# Patient Record
Sex: Female | Born: 1940 | Race: White | Hispanic: No | Marital: Single | Smoking: Never smoker
Health system: Southern US, Community
[De-identification: ages and names within clinical notes are randomized; demographics above are authoritative.]

## PROBLEM LIST (undated history)

## (undated) DIAGNOSIS — R32 Unspecified urinary incontinence: Secondary | ICD-10-CM

## (undated) DIAGNOSIS — M810 Age-related osteoporosis without current pathological fracture: Secondary | ICD-10-CM

## (undated) DIAGNOSIS — I10 Essential (primary) hypertension: Secondary | ICD-10-CM

## (undated) DIAGNOSIS — E785 Hyperlipidemia, unspecified: Secondary | ICD-10-CM

---

## 1998-06-12 ENCOUNTER — Ambulatory Visit (HOSPITAL_COMMUNITY): Admission: RE | Admit: 1998-06-12 | Discharge: 1998-06-12 | Payer: Self-pay | Admitting: Internal Medicine

## 1998-10-22 ENCOUNTER — Encounter: Admission: RE | Admit: 1998-10-22 | Discharge: 1999-01-20 | Payer: Self-pay | Admitting: Internal Medicine

## 2000-06-30 ENCOUNTER — Encounter: Payer: Self-pay | Admitting: Internal Medicine

## 2000-06-30 ENCOUNTER — Ambulatory Visit (HOSPITAL_COMMUNITY): Admission: RE | Admit: 2000-06-30 | Discharge: 2000-06-30 | Payer: Self-pay | Admitting: Internal Medicine

## 2000-09-29 ENCOUNTER — Other Ambulatory Visit: Admission: RE | Admit: 2000-09-29 | Discharge: 2000-09-29 | Payer: Self-pay | Admitting: Internal Medicine

## 2000-10-15 ENCOUNTER — Encounter: Payer: Self-pay | Admitting: Internal Medicine

## 2000-10-15 ENCOUNTER — Encounter: Admission: RE | Admit: 2000-10-15 | Discharge: 2000-10-15 | Payer: Self-pay | Admitting: Internal Medicine

## 2001-10-03 ENCOUNTER — Encounter: Admission: RE | Admit: 2001-10-03 | Discharge: 2002-01-01 | Payer: Self-pay | Admitting: Internal Medicine

## 2001-11-16 ENCOUNTER — Ambulatory Visit (HOSPITAL_COMMUNITY): Admission: RE | Admit: 2001-11-16 | Discharge: 2001-11-16 | Payer: Self-pay | Admitting: Gastroenterology

## 2002-09-27 ENCOUNTER — Other Ambulatory Visit: Admission: RE | Admit: 2002-09-27 | Discharge: 2002-09-27 | Payer: Self-pay | Admitting: Internal Medicine

## 2002-11-15 ENCOUNTER — Ambulatory Visit (HOSPITAL_BASED_OUTPATIENT_CLINIC_OR_DEPARTMENT_OTHER): Admission: RE | Admit: 2002-11-15 | Discharge: 2002-11-15 | Payer: Self-pay | Admitting: Internal Medicine

## 2004-09-30 ENCOUNTER — Other Ambulatory Visit: Admission: RE | Admit: 2004-09-30 | Discharge: 2004-09-30 | Payer: Self-pay | Admitting: Internal Medicine

## 2005-10-27 ENCOUNTER — Encounter: Admission: RE | Admit: 2005-10-27 | Discharge: 2005-10-27 | Payer: Self-pay | Admitting: Internal Medicine

## 2007-11-01 ENCOUNTER — Other Ambulatory Visit: Admission: RE | Admit: 2007-11-01 | Discharge: 2007-11-01 | Payer: Self-pay | Admitting: Internal Medicine

## 2007-11-23 ENCOUNTER — Encounter: Admission: RE | Admit: 2007-11-23 | Discharge: 2007-11-23 | Payer: Self-pay | Admitting: Internal Medicine

## 2009-03-06 ENCOUNTER — Ambulatory Visit (HOSPITAL_COMMUNITY): Admission: RE | Admit: 2009-03-06 | Discharge: 2009-03-06 | Payer: Self-pay | Admitting: Internal Medicine

## 2010-06-19 ENCOUNTER — Ambulatory Visit (HOSPITAL_COMMUNITY): Admission: RE | Admit: 2010-06-19 | Discharge: 2010-06-19 | Payer: Self-pay | Admitting: Internal Medicine

## 2011-01-05 ENCOUNTER — Other Ambulatory Visit: Payer: Self-pay | Admitting: Internal Medicine

## 2011-01-05 ENCOUNTER — Other Ambulatory Visit (HOSPITAL_COMMUNITY)
Admission: RE | Admit: 2011-01-05 | Discharge: 2011-01-05 | Disposition: A | Discharge status: Home / Self Care | Payer: Medicare Other | Source: Ambulatory Visit | Attending: Internal Medicine | Admitting: Internal Medicine

## 2011-01-05 DIAGNOSIS — Z1159 Encounter for screening for other viral diseases: Secondary | ICD-10-CM | POA: Insufficient documentation

## 2011-01-05 DIAGNOSIS — Z124 Encounter for screening for malignant neoplasm of cervix: Secondary | ICD-10-CM | POA: Insufficient documentation

## 2011-01-12 ENCOUNTER — Ambulatory Visit
Admission: RE | Admit: 2011-01-12 | Discharge: 2011-01-12 | Disposition: A | Discharge status: Home / Self Care | Payer: Medicare Other | Source: Ambulatory Visit | Attending: Internal Medicine | Admitting: Internal Medicine

## 2011-03-27 NOTE — Procedures (Signed)
[facility]  Patient:    [name], [name] Visit Number: [id number] MRN: [id number]          Service Type: Attending:  Verlin Grills, M.D. Dictated by:   Verlin Grills, M.D. Proc. Date: 11/16/01   CC:         Tyson Dense, M.D.   Procedure Report  PROCEDURE PERFORMED:  Colonoscopy  INDICATION FOR PROCEDURE:  Mrs. Jaclyn Guzman is a 70 year old female, born [birth year].  She is referred for her first screening colonoscopy with polypectomy to prevent colon cancer.  I discussed with Mrs. Jaclyn Guzman the complications associated with colonoscopy and polypectomy including the 15:1000 risk of bleeding and 4:1000 risk of colonic perforation requiring surgical repair.  Mrs. Jaclyn Guzman has signed the operative permit.  ENDOSCOPIST:  Verlin Grills, M.D.  PREMEDICATION:  Versed 5 mg, Demerol 50 mg.  ENDOSCOPE:  Olympus pediatric colonoscope.  DESCRIPTION OF PROCEDURE:  After obtaining informed consent, Jaclyn Guzman was placed in the left lateral decubitus position.  I administered intravenous Demerol and intravenous Versed to achieve conscious sedation for the procedure.  The patients blood pressure, oxygen saturation and cardiac rhythm were monitored throughout the procedure and documented in the medical record. Anal inspection was normal.  Digital rectal exam was normal.  The Olympus pediatric video colonoscope was introduced into the rectum and easily advanced to the cecum.  Colonic preparation for the exam today was excellent.  Rectum - Normal.  Sigmoid colon and descending colon - Left colonic diverticulosis.  Splenic flexure - Normal.  Transverse colon - Normal.  Hepatic flexure - Normal.  Ascending colon - Normal.  Cecum and ileocecal valve - Normal.  ASSESSMENT:  Left colonic diverticulosis; otherwise normal proctocolonoscopy to the cecum.  No endoscopic evidence for the presence of colorectal neoplasia. Dictated by:    Verlin Grills, M.D. Attending:  Verlin Grills, M.D. DD:  11/16/01 TD:  11/16/01 Job: [id number]

## 2011-07-17 ENCOUNTER — Other Ambulatory Visit (HOSPITAL_COMMUNITY): Payer: Self-pay | Admitting: Internal Medicine

## 2011-07-17 DIAGNOSIS — Z1231 Encounter for screening mammogram for malignant neoplasm of breast: Secondary | ICD-10-CM

## 2011-07-29 ENCOUNTER — Ambulatory Visit (HOSPITAL_COMMUNITY)
Admission: RE | Admit: 2011-07-29 | Discharge: 2011-07-29 | Disposition: A | Discharge status: Home / Self Care | Payer: Medicare Other | Source: Ambulatory Visit | Attending: Internal Medicine | Admitting: Internal Medicine

## 2011-07-29 DIAGNOSIS — Z1231 Encounter for screening mammogram for malignant neoplasm of breast: Secondary | ICD-10-CM | POA: Insufficient documentation

## 2011-11-01 ENCOUNTER — Encounter: Payer: Self-pay | Admitting: Emergency Medicine

## 2011-11-01 ENCOUNTER — Emergency Department (HOSPITAL_BASED_OUTPATIENT_CLINIC_OR_DEPARTMENT_OTHER)
Admission: EM | Admit: 2011-11-01 | Discharge: 2011-11-01 | Disposition: A | Discharge status: Home / Self Care | Payer: Medicare Other | Attending: Emergency Medicine | Admitting: Emergency Medicine

## 2011-11-01 ENCOUNTER — Other Ambulatory Visit: Payer: Self-pay

## 2011-11-01 DIAGNOSIS — S0181XA Laceration without foreign body of other part of head, initial encounter: Secondary | ICD-10-CM

## 2011-11-01 DIAGNOSIS — Y92009 Unspecified place in unspecified non-institutional (private) residence as the place of occurrence of the external cause: Secondary | ICD-10-CM | POA: Insufficient documentation

## 2011-11-01 DIAGNOSIS — R55 Syncope and collapse: Secondary | ICD-10-CM

## 2011-11-01 DIAGNOSIS — S0120XA Unspecified open wound of nose, initial encounter: Secondary | ICD-10-CM | POA: Insufficient documentation

## 2011-11-01 DIAGNOSIS — Z79899 Other long term (current) drug therapy: Secondary | ICD-10-CM | POA: Insufficient documentation

## 2011-11-01 DIAGNOSIS — E119 Type 2 diabetes mellitus without complications: Secondary | ICD-10-CM | POA: Insufficient documentation

## 2011-11-01 DIAGNOSIS — W1811XA Fall from or off toilet without subsequent striking against object, initial encounter: Secondary | ICD-10-CM | POA: Insufficient documentation

## 2011-11-01 MED ORDER — OXYCODONE-ACETAMINOPHEN 5-325 MG PO TABS: 1.0000 | ORAL_TABLET | ORAL | Status: AC | PRN

## 2011-11-01 MED ORDER — LIDOCAINE HCL (PF) 1 % IJ SOLN
5.0000 mL | Freq: Once | INTRAMUSCULAR | Status: DC
  Filled 2011-11-01: qty 5

## 2011-11-01 NOTE — ED Notes (Signed)
Laceration to Upper side of Nose

## 2011-11-01 NOTE — ED Provider Notes (Signed)
History     CSN: [id number]  Arrival date & time 11/01/11  5621   First MD Initiated Contact with Patient 11/01/11 1115      Chief Complaint  Patient presents with  . Facial Laceration    Pt had syncopal episode while on toilet sustained lac to nose, no chest pain and no sob    (Consider location/radiation/quality/duration/timing/severity/associated sxs/prior treatment) HPI  pw facial laceration sustained at 0200 this morning.  Patients states that she was sitting on the toilet, not urinating or defacating when she exp syncopal episode without warning. +LOC, unk down time. Hit her face on a structure in front of her. Denies tongue biting, incontinence. Denies headache, dizziness, cp, palpitations, shortness of breath pre fall and currently. No neck pain or back pain. Denies hip pain. Co pain to nose and laceration to same, ratees as 3/10. Tetanus utd.  Past Medical History  Diagnosis Date  . Diabetes mellitus     History reviewed. No pertinent past surgical history.  History reviewed. No pertinent family history.  History  Substance Use Topics  . Smoking status: Never Smoker   . Smokeless tobacco: Not on file  . Alcohol Use: No    OB History    Grav Para Term Preterm Abortions TAB SAB Ect Mult Living                  Review of Systems  All other systems reviewed and are negative.   except as noted HPI  Allergies  Augmentin  Home Medications   Current Outpatient Rx  Name Route Sig Dispense Refill  . ASPIRIN 81 MG PO TABS Oral Take 81 mg by mouth daily.      . ATORVASTATIN CALCIUM 20 MG PO TABS Oral Take 20 mg by mouth daily.      Marland Kitchen METFORMIN HCL 500 MG PO TABS Oral Take 500 mg by mouth 2 (two) times daily with a meal.      . RAMIPRIL 2.5 MG PO CAPS Oral Take 2.5 mg by mouth daily.      . OXYCODONE-ACETAMINOPHEN 5-325 MG PO TABS Oral Take 1 tablet by mouth every 4 (four) hours as needed for pain. 6 tablet 0    BP 116/55  Pulse 79  Temp(Src) 97.9 F  (36.6 C) (Oral)  Resp 22  SpO2 100%  Physical Exam  Nursing note and vitals reviewed. Constitutional: She is oriented to person, place, and time. She appears well-developed.  HENT:  Head: Atraumatic.  Mouth/Throat: Oropharynx is clear and moist.       No septal hmatoma  Eyes: Conjunctivae and EOM are normal. Pupils are equal, round, and reactive to light.  Neck: Normal range of motion. Neck supple.  Cardiovascular: Normal rate, regular rhythm, normal heart sounds and intact distal pulses.   Pulmonary/Chest: Effort normal and breath sounds normal. No respiratory distress. She has no wheezes. She has no rales.  Abdominal: Soft. She exhibits no distension. There is no tenderness. There is no rebound and no guarding.  Musculoskeletal: Normal range of motion.  Neurological: She is alert and oriented to person, place, and time.  Skin: Skin is warm and dry. No rash noted.       2 cm laceration bridge of nose, no deformity  Psychiatric: She has a normal mood and affect.      Date: 11/01/2011  Rate: 79  Rhythm: normal sinus rhythm  QRS Axis: normal  Intervals: normal  ST/T Wave abnormalities: normal  Conduction Disutrbances:none  Narrative Interpretation:  Old EKG Reviewed: none available   ED Course  LACERATION REPAIR Date/Time: 11/01/2011 5:56 PM Performed by: Forbes Cellar Authorized by: Forbes Cellar Consent: Verbal consent obtained. Risks and benefits: risks, benefits and alternatives were discussed Consent given by: patient Patient understanding: patient states understanding of the procedure being performed Patient consent: the patient's understanding of the procedure matches consent given Procedure consent: procedure consent matches procedure scheduled Patient identity confirmed: arm band Body area: head/neck Location details: nose Laceration length: 2 cm Foreign bodies: no foreign bodies Tendon involvement: none Nerve involvement: none Vascular damage:  no Anesthesia: local infiltration Local anesthetic: lidocaine 1% without epinephrine Anesthetic total: 2 ml Patient sedated: no Irrigation solution: saline Amount of cleaning: standard Skin closure: 6-0 nylon Number of sutures: 4 Technique: simple Approximation: close Approximation difficulty: simple Dressing: antibiotic ointment Patient tolerance: Patient tolerated the procedure well with no immediate complications.   (including critical care time)  Labs Reviewed - No data to display No results found.   1. Facial laceration   2. Syncope and collapse     MDM   Patient has refused syncope workup including labs, CT head. EKG unremarkable. She's asymptomatic at this time. Patient aware of risks of leaving without workup for syncope. Laceration repair and home with PMD f/u. Aware of ossibility f nasal fracture. She would like to fu as outpatient       Forbes Cellar, MD 11/03/11 1800

## 2011-11-01 NOTE — ED Notes (Signed)
Care plan and wound care with suture removal

## 2011-11-01 NOTE — ED Notes (Signed)
Laceration above nose, reports LOC with painful BM while on toilet

## 2011-11-05 ENCOUNTER — Ambulatory Visit (INDEPENDENT_AMBULATORY_CARE_PROVIDER_SITE_OTHER): Payer: Medicare Other

## 2011-11-05 DIAGNOSIS — S0180XA Unspecified open wound of other part of head, initial encounter: Secondary | ICD-10-CM

## 2011-11-05 DIAGNOSIS — Z23 Encounter for immunization: Secondary | ICD-10-CM

## 2011-11-27 ENCOUNTER — Ambulatory Visit (INDEPENDENT_AMBULATORY_CARE_PROVIDER_SITE_OTHER): Payer: Medicare Other

## 2011-11-27 DIAGNOSIS — M542 Cervicalgia: Secondary | ICD-10-CM

## 2011-11-27 DIAGNOSIS — S0083XA Contusion of other part of head, initial encounter: Secondary | ICD-10-CM

## 2011-11-27 DIAGNOSIS — S0003XA Contusion of scalp, initial encounter: Secondary | ICD-10-CM

## 2012-03-16 ENCOUNTER — Other Ambulatory Visit: Payer: Self-pay | Admitting: Gastroenterology

## 2012-07-20 ENCOUNTER — Other Ambulatory Visit (HOSPITAL_COMMUNITY): Payer: Self-pay | Admitting: Internal Medicine

## 2012-07-20 DIAGNOSIS — Z1231 Encounter for screening mammogram for malignant neoplasm of breast: Secondary | ICD-10-CM

## 2012-08-09 ENCOUNTER — Ambulatory Visit (HOSPITAL_COMMUNITY)
Admission: RE | Admit: 2012-08-09 | Discharge: 2012-08-09 | Disposition: A | Discharge status: Home / Self Care | Payer: Medicare Other | Source: Ambulatory Visit | Attending: Internal Medicine | Admitting: Internal Medicine

## 2012-08-09 DIAGNOSIS — Z1231 Encounter for screening mammogram for malignant neoplasm of breast: Secondary | ICD-10-CM | POA: Insufficient documentation

## 2013-07-25 ENCOUNTER — Other Ambulatory Visit: Payer: Self-pay | Admitting: Internal Medicine

## 2013-07-25 DIAGNOSIS — G44009 Cluster headache syndrome, unspecified, not intractable: Secondary | ICD-10-CM

## 2013-07-25 DIAGNOSIS — W19XXXA Unspecified fall, initial encounter: Secondary | ICD-10-CM

## 2013-07-26 ENCOUNTER — Ambulatory Visit
Admission: RE | Admit: 2013-07-26 | Discharge: 2013-07-26 | Disposition: A | Discharge status: Home / Self Care | Payer: Medicare Other | Source: Ambulatory Visit | Attending: Internal Medicine | Admitting: Internal Medicine

## 2013-07-26 DIAGNOSIS — G44009 Cluster headache syndrome, unspecified, not intractable: Secondary | ICD-10-CM

## 2013-07-26 DIAGNOSIS — W19XXXA Unspecified fall, initial encounter: Secondary | ICD-10-CM

## 2014-01-26 ENCOUNTER — Other Ambulatory Visit (HOSPITAL_COMMUNITY): Payer: Self-pay | Admitting: Internal Medicine

## 2014-01-26 DIAGNOSIS — Z1231 Encounter for screening mammogram for malignant neoplasm of breast: Secondary | ICD-10-CM

## 2014-02-06 ENCOUNTER — Ambulatory Visit (HOSPITAL_COMMUNITY)
Admission: RE | Admit: 2014-02-06 | Discharge: 2014-02-06 | Disposition: A | Discharge status: Home / Self Care | Payer: Medicare Other | Source: Ambulatory Visit | Attending: Internal Medicine | Admitting: Internal Medicine

## 2014-02-06 DIAGNOSIS — Z1231 Encounter for screening mammogram for malignant neoplasm of breast: Secondary | ICD-10-CM

## 2015-02-25 ENCOUNTER — Other Ambulatory Visit (HOSPITAL_COMMUNITY): Payer: Self-pay | Admitting: Internal Medicine

## 2015-02-25 DIAGNOSIS — Z1231 Encounter for screening mammogram for malignant neoplasm of breast: Secondary | ICD-10-CM

## 2015-03-04 ENCOUNTER — Ambulatory Visit (HOSPITAL_COMMUNITY)
Admission: RE | Admit: 2015-03-04 | Discharge: 2015-03-04 | Disposition: A | Discharge status: Home / Self Care | Payer: Medicare Other | Source: Ambulatory Visit | Attending: Internal Medicine | Admitting: Internal Medicine

## 2015-03-04 DIAGNOSIS — Z1231 Encounter for screening mammogram for malignant neoplasm of breast: Secondary | ICD-10-CM | POA: Diagnosis present

## 2015-12-26 DIAGNOSIS — E119 Type 2 diabetes mellitus without complications: Secondary | ICD-10-CM | POA: Diagnosis not present

## 2015-12-27 DIAGNOSIS — E119 Type 2 diabetes mellitus without complications: Secondary | ICD-10-CM | POA: Diagnosis not present

## 2016-01-08 DIAGNOSIS — M25562 Pain in left knee: Secondary | ICD-10-CM | POA: Diagnosis not present

## 2016-01-08 DIAGNOSIS — M25561 Pain in right knee: Secondary | ICD-10-CM | POA: Diagnosis not present

## 2016-01-08 DIAGNOSIS — M25552 Pain in left hip: Secondary | ICD-10-CM | POA: Diagnosis not present

## 2016-01-08 DIAGNOSIS — M25551 Pain in right hip: Secondary | ICD-10-CM | POA: Diagnosis not present

## 2016-02-03 DIAGNOSIS — M8580 Other specified disorders of bone density and structure, unspecified site: Secondary | ICD-10-CM | POA: Diagnosis not present

## 2016-02-03 DIAGNOSIS — Z0000 Encounter for general adult medical examination without abnormal findings: Secondary | ICD-10-CM | POA: Diagnosis not present

## 2016-02-03 DIAGNOSIS — E782 Mixed hyperlipidemia: Secondary | ICD-10-CM | POA: Diagnosis not present

## 2016-02-03 DIAGNOSIS — G5600 Carpal tunnel syndrome, unspecified upper limb: Secondary | ICD-10-CM | POA: Diagnosis not present

## 2016-02-03 DIAGNOSIS — E1122 Type 2 diabetes mellitus with diabetic chronic kidney disease: Secondary | ICD-10-CM | POA: Diagnosis not present

## 2016-02-03 DIAGNOSIS — J309 Allergic rhinitis, unspecified: Secondary | ICD-10-CM | POA: Diagnosis not present

## 2016-02-03 DIAGNOSIS — M791 Myalgia: Secondary | ICD-10-CM | POA: Diagnosis not present

## 2016-02-03 DIAGNOSIS — L719 Rosacea, unspecified: Secondary | ICD-10-CM | POA: Diagnosis not present

## 2016-02-03 DIAGNOSIS — Z1389 Encounter for screening for other disorder: Secondary | ICD-10-CM | POA: Diagnosis not present

## 2016-02-03 DIAGNOSIS — N183 Chronic kidney disease, stage 3 (moderate): Secondary | ICD-10-CM | POA: Diagnosis not present

## 2016-03-11 DIAGNOSIS — E119 Type 2 diabetes mellitus without complications: Secondary | ICD-10-CM | POA: Diagnosis not present

## 2016-04-10 ENCOUNTER — Other Ambulatory Visit: Payer: Self-pay

## 2016-04-10 DIAGNOSIS — Z1231 Encounter for screening mammogram for malignant neoplasm of breast: Secondary | ICD-10-CM

## 2016-04-14 DIAGNOSIS — R3915 Urgency of urination: Secondary | ICD-10-CM | POA: Diagnosis not present

## 2016-04-22 ENCOUNTER — Ambulatory Visit: Payer: Medicare Other

## 2016-05-06 DIAGNOSIS — E119 Type 2 diabetes mellitus without complications: Secondary | ICD-10-CM | POA: Diagnosis not present

## 2016-05-20 ENCOUNTER — Other Ambulatory Visit: Payer: Self-pay | Admitting: Internal Medicine

## 2016-05-20 ENCOUNTER — Ambulatory Visit
Admission: RE | Admit: 2016-05-20 | Discharge: 2016-05-20 | Disposition: A | Discharge status: Home / Self Care | Payer: Medicare Other | Source: Ambulatory Visit

## 2016-05-20 DIAGNOSIS — Z1231 Encounter for screening mammogram for malignant neoplasm of breast: Secondary | ICD-10-CM | POA: Diagnosis not present

## 2016-05-22 ENCOUNTER — Other Ambulatory Visit: Payer: Self-pay | Admitting: Internal Medicine

## 2016-05-22 DIAGNOSIS — R928 Other abnormal and inconclusive findings on diagnostic imaging of breast: Secondary | ICD-10-CM

## 2016-05-28 ENCOUNTER — Ambulatory Visit
Admission: RE | Admit: 2016-05-28 | Discharge: 2016-05-28 | Disposition: A | Discharge status: Home / Self Care | Payer: Medicare Other | Source: Ambulatory Visit | Attending: Internal Medicine | Admitting: Internal Medicine

## 2016-05-28 DIAGNOSIS — N63 Unspecified lump in breast: Secondary | ICD-10-CM | POA: Diagnosis not present

## 2016-05-28 DIAGNOSIS — R928 Other abnormal and inconclusive findings on diagnostic imaging of breast: Secondary | ICD-10-CM

## 2016-06-17 DIAGNOSIS — E119 Type 2 diabetes mellitus without complications: Secondary | ICD-10-CM | POA: Diagnosis not present

## 2016-08-05 DIAGNOSIS — L719 Rosacea, unspecified: Secondary | ICD-10-CM | POA: Diagnosis not present

## 2016-08-05 DIAGNOSIS — E782 Mixed hyperlipidemia: Secondary | ICD-10-CM | POA: Diagnosis not present

## 2016-08-05 DIAGNOSIS — E1122 Type 2 diabetes mellitus with diabetic chronic kidney disease: Secondary | ICD-10-CM | POA: Diagnosis not present

## 2016-08-05 DIAGNOSIS — Z23 Encounter for immunization: Secondary | ICD-10-CM | POA: Diagnosis not present

## 2016-08-05 DIAGNOSIS — N183 Chronic kidney disease, stage 3 (moderate): Secondary | ICD-10-CM | POA: Diagnosis not present

## 2016-08-05 DIAGNOSIS — J309 Allergic rhinitis, unspecified: Secondary | ICD-10-CM | POA: Diagnosis not present

## 2016-08-08 DIAGNOSIS — E119 Type 2 diabetes mellitus without complications: Secondary | ICD-10-CM | POA: Diagnosis not present

## 2016-08-24 DIAGNOSIS — M549 Dorsalgia, unspecified: Secondary | ICD-10-CM | POA: Diagnosis not present

## 2016-09-23 DIAGNOSIS — H5201 Hypermetropia, right eye: Secondary | ICD-10-CM | POA: Diagnosis not present

## 2016-09-23 DIAGNOSIS — E119 Type 2 diabetes mellitus without complications: Secondary | ICD-10-CM | POA: Diagnosis not present

## 2016-09-23 DIAGNOSIS — Z961 Presence of intraocular lens: Secondary | ICD-10-CM | POA: Diagnosis not present

## 2016-09-23 DIAGNOSIS — H2511 Age-related nuclear cataract, right eye: Secondary | ICD-10-CM | POA: Diagnosis not present

## 2016-09-23 DIAGNOSIS — Z7984 Long term (current) use of oral hypoglycemic drugs: Secondary | ICD-10-CM | POA: Diagnosis not present

## 2016-09-23 DIAGNOSIS — H26492 Other secondary cataract, left eye: Secondary | ICD-10-CM | POA: Diagnosis not present

## 2016-09-23 DIAGNOSIS — H5212 Myopia, left eye: Secondary | ICD-10-CM | POA: Diagnosis not present

## 2016-09-23 DIAGNOSIS — H52223 Regular astigmatism, bilateral: Secondary | ICD-10-CM | POA: Diagnosis not present

## 2016-09-25 DIAGNOSIS — E119 Type 2 diabetes mellitus without complications: Secondary | ICD-10-CM | POA: Diagnosis not present

## 2016-11-17 DIAGNOSIS — E119 Type 2 diabetes mellitus without complications: Secondary | ICD-10-CM | POA: Diagnosis not present

## 2016-11-30 DIAGNOSIS — E119 Type 2 diabetes mellitus without complications: Secondary | ICD-10-CM | POA: Diagnosis not present

## 2016-12-21 DIAGNOSIS — K7689 Other specified diseases of liver: Secondary | ICD-10-CM | POA: Diagnosis not present

## 2016-12-21 DIAGNOSIS — L299 Pruritus, unspecified: Secondary | ICD-10-CM | POA: Diagnosis not present

## 2016-12-21 DIAGNOSIS — E1122 Type 2 diabetes mellitus with diabetic chronic kidney disease: Secondary | ICD-10-CM | POA: Diagnosis not present

## 2016-12-22 ENCOUNTER — Other Ambulatory Visit: Payer: Self-pay | Admitting: Internal Medicine

## 2016-12-22 DIAGNOSIS — R945 Abnormal results of liver function studies: Principal | ICD-10-CM

## 2016-12-22 DIAGNOSIS — R7989 Other specified abnormal findings of blood chemistry: Secondary | ICD-10-CM

## 2016-12-24 ENCOUNTER — Other Ambulatory Visit: Payer: Medicare Other

## 2016-12-28 ENCOUNTER — Ambulatory Visit
Admission: RE | Admit: 2016-12-28 | Discharge: 2016-12-28 | Disposition: A | Discharge status: Home / Self Care | Payer: Medicare Other | Source: Ambulatory Visit | Attending: Internal Medicine | Admitting: Internal Medicine

## 2016-12-28 DIAGNOSIS — R740 Nonspecific elevation of levels of transaminase and lactic acid dehydrogenase [LDH]: Secondary | ICD-10-CM | POA: Diagnosis not present

## 2016-12-28 DIAGNOSIS — K8020 Calculus of gallbladder without cholecystitis without obstruction: Secondary | ICD-10-CM | POA: Diagnosis not present

## 2016-12-28 DIAGNOSIS — R945 Abnormal results of liver function studies: Principal | ICD-10-CM

## 2016-12-28 DIAGNOSIS — R7989 Other specified abnormal findings of blood chemistry: Secondary | ICD-10-CM

## 2016-12-30 DIAGNOSIS — K8020 Calculus of gallbladder without cholecystitis without obstruction: Secondary | ICD-10-CM | POA: Diagnosis not present

## 2017-01-06 DIAGNOSIS — R748 Abnormal levels of other serum enzymes: Secondary | ICD-10-CM | POA: Diagnosis not present

## 2017-01-10 DIAGNOSIS — M25561 Pain in right knee: Secondary | ICD-10-CM | POA: Diagnosis not present

## 2017-01-11 DIAGNOSIS — E119 Type 2 diabetes mellitus without complications: Secondary | ICD-10-CM | POA: Diagnosis not present

## 2017-02-18 DIAGNOSIS — M8580 Other specified disorders of bone density and structure, unspecified site: Secondary | ICD-10-CM | POA: Diagnosis not present

## 2017-02-18 DIAGNOSIS — Z0000 Encounter for general adult medical examination without abnormal findings: Secondary | ICD-10-CM | POA: Diagnosis not present

## 2017-02-18 DIAGNOSIS — E1122 Type 2 diabetes mellitus with diabetic chronic kidney disease: Secondary | ICD-10-CM | POA: Diagnosis not present

## 2017-02-18 DIAGNOSIS — E782 Mixed hyperlipidemia: Secondary | ICD-10-CM | POA: Diagnosis not present

## 2017-02-24 ENCOUNTER — Other Ambulatory Visit: Payer: Self-pay | Admitting: Internal Medicine

## 2017-02-24 DIAGNOSIS — R922 Inconclusive mammogram: Secondary | ICD-10-CM

## 2017-03-03 ENCOUNTER — Other Ambulatory Visit: Payer: Medicare Other

## 2017-03-10 DIAGNOSIS — Z86010 Personal history of colonic polyps: Secondary | ICD-10-CM | POA: Diagnosis not present

## 2017-03-10 DIAGNOSIS — E119 Type 2 diabetes mellitus without complications: Secondary | ICD-10-CM | POA: Diagnosis not present

## 2017-04-07 DIAGNOSIS — K5730 Diverticulosis of large intestine without perforation or abscess without bleeding: Secondary | ICD-10-CM | POA: Diagnosis not present

## 2017-04-07 DIAGNOSIS — K648 Other hemorrhoids: Secondary | ICD-10-CM | POA: Diagnosis not present

## 2017-04-07 DIAGNOSIS — K6389 Other specified diseases of intestine: Secondary | ICD-10-CM | POA: Diagnosis not present

## 2017-04-07 DIAGNOSIS — Z86010 Personal history of colonic polyps: Secondary | ICD-10-CM | POA: Diagnosis not present

## 2017-04-13 DIAGNOSIS — K6389 Other specified diseases of intestine: Secondary | ICD-10-CM | POA: Diagnosis not present

## 2017-04-14 DIAGNOSIS — E119 Type 2 diabetes mellitus without complications: Secondary | ICD-10-CM | POA: Diagnosis not present

## 2017-05-31 ENCOUNTER — Ambulatory Visit
Admission: RE | Admit: 2017-05-31 | Discharge: 2017-05-31 | Disposition: A | Discharge status: Home / Self Care | Payer: Medicare Other | Source: Ambulatory Visit | Attending: Internal Medicine | Admitting: Internal Medicine

## 2017-05-31 ENCOUNTER — Ambulatory Visit: Payer: Medicare Other

## 2017-05-31 DIAGNOSIS — R928 Other abnormal and inconclusive findings on diagnostic imaging of breast: Secondary | ICD-10-CM | POA: Diagnosis not present

## 2017-05-31 DIAGNOSIS — R922 Inconclusive mammogram: Secondary | ICD-10-CM

## 2017-06-02 DIAGNOSIS — E119 Type 2 diabetes mellitus without complications: Secondary | ICD-10-CM | POA: Diagnosis not present

## 2017-06-10 DIAGNOSIS — R3915 Urgency of urination: Secondary | ICD-10-CM | POA: Diagnosis not present

## 2017-07-01 DIAGNOSIS — H5201 Hypermetropia, right eye: Secondary | ICD-10-CM | POA: Diagnosis not present

## 2017-07-01 DIAGNOSIS — H25811 Combined forms of age-related cataract, right eye: Secondary | ICD-10-CM | POA: Diagnosis not present

## 2017-07-01 DIAGNOSIS — H5212 Myopia, left eye: Secondary | ICD-10-CM | POA: Diagnosis not present

## 2017-07-01 DIAGNOSIS — H52223 Regular astigmatism, bilateral: Secondary | ICD-10-CM | POA: Diagnosis not present

## 2017-08-18 DIAGNOSIS — E1122 Type 2 diabetes mellitus with diabetic chronic kidney disease: Secondary | ICD-10-CM | POA: Diagnosis not present

## 2017-08-18 DIAGNOSIS — N183 Chronic kidney disease, stage 3 (moderate): Secondary | ICD-10-CM | POA: Diagnosis not present

## 2017-08-18 DIAGNOSIS — J309 Allergic rhinitis, unspecified: Secondary | ICD-10-CM | POA: Diagnosis not present

## 2017-08-18 DIAGNOSIS — E782 Mixed hyperlipidemia: Secondary | ICD-10-CM | POA: Diagnosis not present

## 2017-09-09 DIAGNOSIS — E119 Type 2 diabetes mellitus without complications: Secondary | ICD-10-CM | POA: Diagnosis not present

## 2017-09-16 DIAGNOSIS — R32 Unspecified urinary incontinence: Secondary | ICD-10-CM | POA: Diagnosis not present

## 2017-09-16 DIAGNOSIS — R3915 Urgency of urination: Secondary | ICD-10-CM | POA: Diagnosis not present

## 2017-10-13 DIAGNOSIS — M8588 Other specified disorders of bone density and structure, other site: Secondary | ICD-10-CM | POA: Diagnosis not present

## 2017-10-22 DIAGNOSIS — E1122 Type 2 diabetes mellitus with diabetic chronic kidney disease: Secondary | ICD-10-CM | POA: Diagnosis not present

## 2017-10-28 DIAGNOSIS — E119 Type 2 diabetes mellitus without complications: Secondary | ICD-10-CM | POA: Diagnosis not present

## 2017-12-17 DIAGNOSIS — E1122 Type 2 diabetes mellitus with diabetic chronic kidney disease: Secondary | ICD-10-CM | POA: Diagnosis not present

## 2018-01-19 DIAGNOSIS — R3915 Urgency of urination: Secondary | ICD-10-CM | POA: Diagnosis not present

## 2018-01-27 DIAGNOSIS — H5201 Hypermetropia, right eye: Secondary | ICD-10-CM | POA: Diagnosis not present

## 2018-01-27 DIAGNOSIS — H25819 Combined forms of age-related cataract, unspecified eye: Secondary | ICD-10-CM | POA: Diagnosis not present

## 2018-01-27 DIAGNOSIS — Z961 Presence of intraocular lens: Secondary | ICD-10-CM | POA: Diagnosis not present

## 2018-01-27 DIAGNOSIS — E119 Type 2 diabetes mellitus without complications: Secondary | ICD-10-CM | POA: Diagnosis not present

## 2018-01-28 DIAGNOSIS — E1122 Type 2 diabetes mellitus with diabetic chronic kidney disease: Secondary | ICD-10-CM | POA: Diagnosis not present

## 2018-02-24 DIAGNOSIS — Z0000 Encounter for general adult medical examination without abnormal findings: Secondary | ICD-10-CM | POA: Diagnosis not present

## 2018-02-24 DIAGNOSIS — M8580 Other specified disorders of bone density and structure, unspecified site: Secondary | ICD-10-CM | POA: Diagnosis not present

## 2018-02-24 DIAGNOSIS — E1122 Type 2 diabetes mellitus with diabetic chronic kidney disease: Secondary | ICD-10-CM | POA: Diagnosis not present

## 2018-02-24 DIAGNOSIS — E782 Mixed hyperlipidemia: Secondary | ICD-10-CM | POA: Diagnosis not present

## 2018-03-23 DIAGNOSIS — E1122 Type 2 diabetes mellitus with diabetic chronic kidney disease: Secondary | ICD-10-CM | POA: Diagnosis not present

## 2018-05-06 DIAGNOSIS — E1122 Type 2 diabetes mellitus with diabetic chronic kidney disease: Secondary | ICD-10-CM | POA: Diagnosis not present

## 2018-06-29 DIAGNOSIS — E1122 Type 2 diabetes mellitus with diabetic chronic kidney disease: Secondary | ICD-10-CM | POA: Diagnosis not present

## 2018-07-20 ENCOUNTER — Other Ambulatory Visit: Payer: Self-pay | Admitting: Internal Medicine

## 2018-07-20 DIAGNOSIS — M79604 Pain in right leg: Secondary | ICD-10-CM | POA: Diagnosis not present

## 2018-07-20 DIAGNOSIS — M79605 Pain in left leg: Secondary | ICD-10-CM

## 2018-07-20 DIAGNOSIS — Z23 Encounter for immunization: Secondary | ICD-10-CM | POA: Diagnosis not present

## 2018-07-20 DIAGNOSIS — R0989 Other specified symptoms and signs involving the circulatory and respiratory systems: Secondary | ICD-10-CM

## 2018-07-22 ENCOUNTER — Ambulatory Visit
Admission: RE | Admit: 2018-07-22 | Discharge: 2018-07-22 | Disposition: A | Discharge status: Home / Self Care | Payer: Medicare Other | Source: Ambulatory Visit | Attending: Internal Medicine | Admitting: Internal Medicine

## 2018-07-22 DIAGNOSIS — M79605 Pain in left leg: Secondary | ICD-10-CM

## 2018-07-22 DIAGNOSIS — M79662 Pain in left lower leg: Secondary | ICD-10-CM | POA: Diagnosis not present

## 2018-07-22 DIAGNOSIS — R0989 Other specified symptoms and signs involving the circulatory and respiratory systems: Secondary | ICD-10-CM

## 2018-07-22 DIAGNOSIS — M79661 Pain in right lower leg: Secondary | ICD-10-CM | POA: Diagnosis not present

## 2018-07-22 DIAGNOSIS — M79604 Pain in right leg: Secondary | ICD-10-CM

## 2018-08-15 DIAGNOSIS — E1122 Type 2 diabetes mellitus with diabetic chronic kidney disease: Secondary | ICD-10-CM | POA: Diagnosis not present

## 2018-09-09 DIAGNOSIS — E782 Mixed hyperlipidemia: Secondary | ICD-10-CM | POA: Diagnosis not present

## 2018-09-09 DIAGNOSIS — J309 Allergic rhinitis, unspecified: Secondary | ICD-10-CM | POA: Diagnosis not present

## 2018-09-09 DIAGNOSIS — E1122 Type 2 diabetes mellitus with diabetic chronic kidney disease: Secondary | ICD-10-CM | POA: Diagnosis not present

## 2018-09-09 DIAGNOSIS — N183 Chronic kidney disease, stage 3 (moderate): Secondary | ICD-10-CM | POA: Diagnosis not present

## 2018-09-14 ENCOUNTER — Other Ambulatory Visit: Payer: Self-pay | Admitting: Internal Medicine

## 2018-09-14 DIAGNOSIS — Z1231 Encounter for screening mammogram for malignant neoplasm of breast: Secondary | ICD-10-CM

## 2018-09-27 DIAGNOSIS — E1122 Type 2 diabetes mellitus with diabetic chronic kidney disease: Secondary | ICD-10-CM | POA: Diagnosis not present

## 2018-10-27 ENCOUNTER — Ambulatory Visit
Admission: RE | Admit: 2018-10-27 | Discharge: 2018-10-27 | Disposition: A | Discharge status: Home / Self Care | Payer: Medicare Other | Source: Ambulatory Visit | Attending: Internal Medicine | Admitting: Internal Medicine

## 2018-10-27 DIAGNOSIS — Z1231 Encounter for screening mammogram for malignant neoplasm of breast: Secondary | ICD-10-CM | POA: Diagnosis not present

## 2018-12-23 DIAGNOSIS — E1122 Type 2 diabetes mellitus with diabetic chronic kidney disease: Secondary | ICD-10-CM | POA: Diagnosis not present

## 2018-12-31 DIAGNOSIS — E1122 Type 2 diabetes mellitus with diabetic chronic kidney disease: Secondary | ICD-10-CM | POA: Diagnosis not present

## 2019-01-25 DIAGNOSIS — R32 Unspecified urinary incontinence: Secondary | ICD-10-CM | POA: Diagnosis not present

## 2019-01-25 DIAGNOSIS — R3915 Urgency of urination: Secondary | ICD-10-CM | POA: Diagnosis not present

## 2019-01-31 DIAGNOSIS — M1712 Unilateral primary osteoarthritis, left knee: Secondary | ICD-10-CM | POA: Diagnosis not present

## 2019-01-31 DIAGNOSIS — M25552 Pain in left hip: Secondary | ICD-10-CM | POA: Diagnosis not present

## 2019-01-31 DIAGNOSIS — M1711 Unilateral primary osteoarthritis, right knee: Secondary | ICD-10-CM | POA: Diagnosis not present

## 2019-01-31 DIAGNOSIS — M25551 Pain in right hip: Secondary | ICD-10-CM | POA: Diagnosis not present

## 2019-03-08 DIAGNOSIS — H2511 Age-related nuclear cataract, right eye: Secondary | ICD-10-CM | POA: Diagnosis not present

## 2019-03-08 DIAGNOSIS — E119 Type 2 diabetes mellitus without complications: Secondary | ICD-10-CM | POA: Diagnosis not present

## 2019-03-08 DIAGNOSIS — H5201 Hypermetropia, right eye: Secondary | ICD-10-CM | POA: Diagnosis not present

## 2019-03-08 DIAGNOSIS — Z961 Presence of intraocular lens: Secondary | ICD-10-CM | POA: Diagnosis not present

## 2019-04-08 DIAGNOSIS — E1122 Type 2 diabetes mellitus with diabetic chronic kidney disease: Secondary | ICD-10-CM | POA: Diagnosis not present

## 2019-06-19 DIAGNOSIS — L719 Rosacea, unspecified: Secondary | ICD-10-CM | POA: Diagnosis not present

## 2019-06-19 DIAGNOSIS — E782 Mixed hyperlipidemia: Secondary | ICD-10-CM | POA: Diagnosis not present

## 2019-06-19 DIAGNOSIS — M8580 Other specified disorders of bone density and structure, unspecified site: Secondary | ICD-10-CM | POA: Diagnosis not present

## 2019-06-19 DIAGNOSIS — E1122 Type 2 diabetes mellitus with diabetic chronic kidney disease: Secondary | ICD-10-CM | POA: Diagnosis not present

## 2019-06-26 DIAGNOSIS — Z7984 Long term (current) use of oral hypoglycemic drugs: Secondary | ICD-10-CM | POA: Diagnosis not present

## 2019-06-26 DIAGNOSIS — N182 Chronic kidney disease, stage 2 (mild): Secondary | ICD-10-CM | POA: Diagnosis not present

## 2019-06-26 DIAGNOSIS — E1122 Type 2 diabetes mellitus with diabetic chronic kidney disease: Secondary | ICD-10-CM | POA: Diagnosis not present

## 2019-06-26 DIAGNOSIS — E782 Mixed hyperlipidemia: Secondary | ICD-10-CM | POA: Diagnosis not present

## 2019-07-11 DIAGNOSIS — E1122 Type 2 diabetes mellitus with diabetic chronic kidney disease: Secondary | ICD-10-CM | POA: Diagnosis not present

## 2019-07-15 ENCOUNTER — Emergency Department (HOSPITAL_BASED_OUTPATIENT_CLINIC_OR_DEPARTMENT_OTHER)
Admission: EM | Admit: 2019-07-15 | Discharge: 2019-07-15 | Disposition: A | Discharge status: Home / Self Care | Payer: Medicare Other | Attending: Emergency Medicine | Admitting: Emergency Medicine

## 2019-07-15 ENCOUNTER — Other Ambulatory Visit: Payer: Self-pay

## 2019-07-15 ENCOUNTER — Emergency Department (HOSPITAL_BASED_OUTPATIENT_CLINIC_OR_DEPARTMENT_OTHER): Payer: Medicare Other

## 2019-07-15 ENCOUNTER — Encounter (HOSPITAL_BASED_OUTPATIENT_CLINIC_OR_DEPARTMENT_OTHER): Payer: Self-pay | Admitting: Emergency Medicine

## 2019-07-15 DIAGNOSIS — Z79899 Other long term (current) drug therapy: Secondary | ICD-10-CM | POA: Insufficient documentation

## 2019-07-15 DIAGNOSIS — M5136 Other intervertebral disc degeneration, lumbar region: Secondary | ICD-10-CM | POA: Diagnosis not present

## 2019-07-15 DIAGNOSIS — M545 Low back pain, unspecified: Secondary | ICD-10-CM

## 2019-07-15 DIAGNOSIS — Z7982 Long term (current) use of aspirin: Secondary | ICD-10-CM | POA: Diagnosis not present

## 2019-07-15 DIAGNOSIS — E119 Type 2 diabetes mellitus without complications: Secondary | ICD-10-CM | POA: Insufficient documentation

## 2019-07-15 NOTE — Discharge Instructions (Addendum)
You may take acetaminophen 650 mg every 4-6 hours as needed for pain relief.

## 2019-07-15 NOTE — ED Triage Notes (Signed)
Pt fell Wednesday. She fell backwards. Denies LOC, did not hit her head. C/o low back pain

## 2019-07-15 NOTE — ED Provider Notes (Signed)
[facility] EMERGENCY DEPARTMENT Provider Note   CSN: [id number] Arrival date & time: 07/15/19  1101     History   Chief Complaint Chief Complaint  Patient presents with  . Fall    HPI Jaclyn Guzman is a 78 y.o. female.     HPI 3 days ago patient fell from standing.  States she fell onto her back.  Denies hitting her head or loss of consciousness.  States she was initially shaken up.  Denies focal weakness or numbness.  Patient states over the last 2 days she has been experiencing low back pain.  Is worse with movement and palpation.  No radiation of the pain.  No urinary incontinence. Past Medical History:  Diagnosis Date  . Diabetes mellitus     There are no active problems to display for this patient.   History reviewed. No pertinent surgical history.   OB History   No obstetric history on file.      Home Medications    Prior to Admission medications   Medication Sig Start Date End Date Taking? Authorizing Provider  aspirin 81 MG tablet Take 81 mg by mouth daily.      [provider]  atorvastatin (LIPITOR) 20 MG tablet Take 20 mg by mouth daily.      [provider]  metFORMIN (GLUCOPHAGE) 500 MG tablet Take 500 mg by mouth 2 (two) times daily with a meal.      [provider]  ramipril (ALTACE) 2.5 MG capsule Take 2.5 mg by mouth daily.      [provider]    Family History Family History  Problem Relation Age of Onset  . Breast cancer Neg Hx     Social History Social History   Tobacco Use  . Smoking status: Never Smoker  . Smokeless tobacco: Never Used  Substance Use Topics  . Alcohol use: No  . Drug use: No     Allergies   Amoxicillin-pot clavulanate, Meloxicam, and Toviaz [fesoterodine fumarate er]   Review of Systems Review of Systems  Constitutional: Negative for chills and fever.  Respiratory: Negative for cough and shortness of breath.   Cardiovascular: Negative for chest pain.   Gastrointestinal: Negative for abdominal pain, nausea and vomiting.  Genitourinary: Negative for dysuria, flank pain and frequency.  Musculoskeletal: Positive for back pain. Negative for arthralgias and myalgias.  Skin: Negative for rash and wound.  Neurological: Negative for dizziness, weakness, light-headedness, numbness and headaches.  All other systems reviewed and are negative.    Physical Exam Updated Vital Signs BP 129/67 (BP Location: Left Arm)   Pulse 76   Temp 98.7 F (37.1 C) (Oral)   Resp 18   Ht 5\' 3"  (1.6 m)   Wt 60.8 kg   SpO2 99%   BMI 23.74 kg/m   Physical Exam Vitals signs and nursing note reviewed.  Constitutional:      General: She is not in acute distress.    Appearance: Normal appearance. She is well-developed. She is not ill-appearing.  HENT:     Head: Normocephalic and atraumatic.     Nose: Nose normal.     Mouth/Throat:     Mouth: Mucous membranes are moist.  Eyes:     Pupils: Pupils are equal, round, and reactive to light.  Neck:     Musculoskeletal: Normal range of motion and neck supple.  Cardiovascular:     Rate and Rhythm: Normal rate and regular rhythm.  Pulmonary:  Effort: Pulmonary effort is normal.     Breath sounds: Normal breath sounds.  Abdominal:     General: Bowel sounds are normal.     Palpations: Abdomen is soft.     Tenderness: There is no abdominal tenderness. There is no guarding or rebound.  Musculoskeletal: Normal range of motion.        General: Tenderness present.     Comments: Patient has focal tenderness in the inferior midline lumbar spine.  No step-offs or deformity.  Distal pulses are 2+.  Skin:    General: Skin is warm and dry.     Findings: No erythema or rash.  Neurological:     General: No focal deficit present.     Mental Status: She is alert and oriented to person, place, and time.     Comments: 5/5 motor in all extremities.  Sensation intact specifically no saddle anesthesia.  Psychiatric:         Mood and Affect: Mood normal.        Behavior: Behavior normal.      ED Treatments / Results  Labs (all labs ordered are listed, but only abnormal results are displayed) Labs Reviewed - No data to display  EKG None  Radiology Dg Lumbar Spine Complete  Result Date: 07/15/2019 CLINICAL DATA:  Low back pain for 3 days since a fall. Initial encounter. EXAM: LUMBAR SPINE - COMPLETE 4+ VIEW COMPARISON:  None. FINDINGS: There is mild convex left scoliosis. No fracture or listhesis. Loss of disc space height is most notable at L4-5. Facet degenerative disease is worst at L4-5 and L5-S1. Paraspinous structures demonstrate atherosclerosis. IMPRESSION: No acute finding. Mild convex left scoliosis.  Lower lumbar spondylosis also seen. Atherosclerosis. Electronically Signed   By: Inge Rise M.D.   On: 07/15/2019 12:52    Procedures Procedures (including critical care time)  Medications Ordered in ED Medications - No data to display   Initial Impression / Assessment and Plan / ED Course  I have reviewed the triage vital signs and the nursing notes.  Pertinent labs & imaging results that were available during my care of the patient were reviewed by me and considered in my medical decision making (see chart for details).        X-rays without evidence of fracture.  Patient does have degenerative disc disease which may be responsible for her symptoms.  Do not believe that further imaging is necessary at this point.  Has a normal neurologic exam.  Will treat symptomatically with acetaminophen.  Return precautions given.  Final Clinical Impressions(s) / ED Diagnoses   Final diagnoses:  Acute midline low back pain without sciatica  Degenerative lumbar disc    ED Discharge Orders    None       Julianne Rice, MD 07/15/19 1411

## 2019-08-18 ENCOUNTER — Encounter (HOSPITAL_BASED_OUTPATIENT_CLINIC_OR_DEPARTMENT_OTHER): Payer: Self-pay

## 2019-08-18 ENCOUNTER — Observation Stay (HOSPITAL_BASED_OUTPATIENT_CLINIC_OR_DEPARTMENT_OTHER)
Admission: EM | Admit: 2019-08-18 | Discharge: 2019-08-20 | Disposition: A | Discharge status: Home / Self Care | Payer: Medicare Other | Attending: Internal Medicine | Admitting: Internal Medicine

## 2019-08-18 ENCOUNTER — Other Ambulatory Visit: Payer: Self-pay

## 2019-08-18 ENCOUNTER — Emergency Department (HOSPITAL_BASED_OUTPATIENT_CLINIC_OR_DEPARTMENT_OTHER): Payer: Medicare Other

## 2019-08-18 DIAGNOSIS — R29818 Other symptoms and signs involving the nervous system: Secondary | ICD-10-CM | POA: Diagnosis not present

## 2019-08-18 DIAGNOSIS — Y939 Activity, unspecified: Secondary | ICD-10-CM | POA: Diagnosis not present

## 2019-08-18 DIAGNOSIS — I10 Essential (primary) hypertension: Secondary | ICD-10-CM | POA: Diagnosis not present

## 2019-08-18 DIAGNOSIS — E119 Type 2 diabetes mellitus without complications: Secondary | ICD-10-CM | POA: Insufficient documentation

## 2019-08-18 DIAGNOSIS — Y999 Unspecified external cause status: Secondary | ICD-10-CM | POA: Insufficient documentation

## 2019-08-18 DIAGNOSIS — Z20828 Contact with and (suspected) exposure to other viral communicable diseases: Secondary | ICD-10-CM | POA: Insufficient documentation

## 2019-08-18 DIAGNOSIS — R32 Unspecified urinary incontinence: Secondary | ICD-10-CM | POA: Diagnosis not present

## 2019-08-18 DIAGNOSIS — Y929 Unspecified place or not applicable: Secondary | ICD-10-CM | POA: Insufficient documentation

## 2019-08-18 DIAGNOSIS — W1839XA Other fall on same level, initial encounter: Secondary | ICD-10-CM | POA: Diagnosis not present

## 2019-08-18 DIAGNOSIS — S92301A Fracture of unspecified metatarsal bone(s), right foot, initial encounter for closed fracture: Secondary | ICD-10-CM

## 2019-08-18 DIAGNOSIS — S92514A Nondisplaced fracture of proximal phalanx of right lesser toe(s), initial encounter for closed fracture: Secondary | ICD-10-CM | POA: Diagnosis not present

## 2019-08-18 DIAGNOSIS — S99921A Unspecified injury of right foot, initial encounter: Secondary | ICD-10-CM | POA: Diagnosis not present

## 2019-08-18 DIAGNOSIS — S92351A Displaced fracture of fifth metatarsal bone, right foot, initial encounter for closed fracture: Secondary | ICD-10-CM | POA: Diagnosis not present

## 2019-08-18 DIAGNOSIS — Z79899 Other long term (current) drug therapy: Secondary | ICD-10-CM | POA: Insufficient documentation

## 2019-08-18 DIAGNOSIS — E785 Hyperlipidemia, unspecified: Secondary | ICD-10-CM | POA: Insufficient documentation

## 2019-08-18 DIAGNOSIS — R296 Repeated falls: Secondary | ICD-10-CM | POA: Diagnosis not present

## 2019-08-18 DIAGNOSIS — E1149 Type 2 diabetes mellitus with other diabetic neurological complication: Secondary | ICD-10-CM

## 2019-08-18 DIAGNOSIS — M6281 Muscle weakness (generalized): Secondary | ICD-10-CM | POA: Diagnosis not present

## 2019-08-18 DIAGNOSIS — G459 Transient cerebral ischemic attack, unspecified: Secondary | ICD-10-CM | POA: Diagnosis not present

## 2019-08-18 DIAGNOSIS — Z8673 Personal history of transient ischemic attack (TIA), and cerebral infarction without residual deficits: Secondary | ICD-10-CM | POA: Diagnosis present

## 2019-08-18 DIAGNOSIS — M79674 Pain in right toe(s): Secondary | ICD-10-CM | POA: Diagnosis not present

## 2019-08-18 HISTORY — DX: Age-related osteoporosis without current pathological fracture: M81.0

## 2019-08-18 HISTORY — DX: Essential (primary) hypertension: I10

## 2019-08-18 HISTORY — DX: Hyperlipidemia, unspecified: E78.5

## 2019-08-18 HISTORY — DX: Unspecified urinary incontinence: R32

## 2019-08-18 LAB — COMPREHENSIVE METABOLIC PANEL
ALT: 18 U/L (ref 0–44)
AST: 18 U/L (ref 15–41)
Albumin: 4.2 g/dL (ref 3.5–5.0)
Alkaline Phosphatase: 41 U/L (ref 38–126)
Anion gap: 11 (ref 5–15)
BUN: 19 mg/dL (ref 8–23)
CO2: 24 mmol/L (ref 22–32)
Calcium: 9.6 mg/dL (ref 8.9–10.3)
Chloride: 101 mmol/L (ref 98–111)
Creatinine, Ser: 0.84 mg/dL (ref 0.44–1.00)
GFR calc Af Amer: >60 mL/min (ref >60)
GFR calc non Af Amer: >60 mL/min (ref >60)
Glucose, Bld: 103 mg/dL — ABNORMAL HIGH (ref 70–99)
Potassium: 4 mmol/L (ref 3.5–5.1)
Sodium: 136 mmol/L (ref 135–145)
Total Bilirubin: 0.7 mg/dL (ref 0.3–1.2)
Total Protein: 6.7 g/dL (ref 6.5–8.1)

## 2019-08-18 LAB — DIFFERENTIAL
Abs Immature Granulocytes: 0.01 10*3/uL (ref 0.00–0.07)
Basophils Absolute: 0 10*3/uL (ref 0.0–0.1)
Basophils Relative: 1 %
Eosinophils Absolute: 0.1 10*3/uL (ref 0.0–0.5)
Eosinophils Relative: 2 %
Immature Granulocytes: 0 %
Lymphocytes Relative: 31 %
Lymphs Abs: 2 10*3/uL (ref 0.7–4.0)
Monocytes Absolute: 0.5 10*3/uL (ref 0.1–1.0)
Monocytes Relative: 8 %
Neutro Abs: 3.8 10*3/uL (ref 1.7–7.7)
Neutrophils Relative %: 58 %

## 2019-08-18 LAB — CBC
HCT: 39.4 % (ref 36.0–46.0)
Hemoglobin: 13.1 g/dL (ref 12.0–15.0)
MCH: 31.9 pg (ref 26.0–34.0)
MCHC: 33.2 g/dL (ref 30.0–36.0)
MCV: 95.9 fL (ref 80.0–100.0)
Platelets: 226 10*3/uL (ref 150–400)
RBC: 4.11 MIL/uL (ref 3.87–5.11)
RDW: 13.2 % (ref 11.5–15.5)
WBC: 6.5 10*3/uL (ref 4.0–10.5)
nRBC: 0 % (ref 0.0–0.2)

## 2019-08-18 LAB — URINALYSIS, ROUTINE W REFLEX MICROSCOPIC
Bilirubin Urine: NEGATIVE
Glucose, UA: NEGATIVE mg/dL
Hgb urine dipstick: NEGATIVE
Ketones, ur: NEGATIVE mg/dL
Nitrite: POSITIVE — AB
Protein, ur: NEGATIVE mg/dL
Specific Gravity, Urine: 1.01 (ref 1.005–1.030)
pH: 6 (ref 5.0–8.0)

## 2019-08-18 LAB — RAPID URINE DRUG SCREEN, HOSP PERFORMED
Amphetamines: NOT DETECTED
Barbiturates: NOT DETECTED
Benzodiazepines: NOT DETECTED
Cocaine: NOT DETECTED
Opiates: NOT DETECTED
Tetrahydrocannabinol: NOT DETECTED

## 2019-08-18 LAB — URINALYSIS, MICROSCOPIC (REFLEX)

## 2019-08-18 LAB — ETHANOL: Alcohol, Ethyl (B): <10 mg/dL (ref <10)

## 2019-08-18 LAB — PROTIME-INR
INR: 1 (ref 0.8–1.2)
Prothrombin Time: 12.9 seconds (ref 11.4–15.2)

## 2019-08-18 LAB — APTT: aPTT: 25 seconds (ref 24–36)

## 2019-08-18 NOTE — ED Provider Notes (Addendum)
Emergency Department Provider Note   I have reviewed the triage vital signs and the nursing notes.   HISTORY  Chief Complaint Weakness   HPI Jaclyn Guzman is a 78 y.o. female with past medical history of diabetes and hyperlipidemia presents to the emergency department with acute onset right leg weakness with significant gait instability and fall.  Patient estimates that symptoms began around 8 or 9 PM yesterday.  She states she felt like her right leg suddenly became heavy and when she attempted to walk she fell.  She states she was able to crawl back into a chair and stumbled her way through the house leaning on furniture in the walls.  She awoke this morning with continued symptoms and ultimately called family after lunch to be transported to the emergency department.  She states she required significant assistance getting out of the vehicle.  She denies any associated numbness.  No vision changes.  No speech disturbance.  No difficulty swallowing.  No history of stroke.  She does have pain in her right little toe after the initial fall.  Past Medical History:  Diagnosis Date  . Diabetes mellitus     Patient Active Problem List   Diagnosis Date Noted  . TIA (transient ischemic attack) 08/18/2019    History reviewed. No pertinent surgical history.  Allergies Amoxicillin-pot clavulanate, Meloxicam, and Toviaz [fesoterodine fumarate er]  Family History  Problem Relation Age of Onset  . Breast cancer Neg Hx     Social History Social History   Tobacco Use  . Smoking status: Never Smoker  . Smokeless tobacco: Never Used  Substance Use Topics  . Alcohol use: No  . Drug use: No    Review of Systems  Constitutional: No fever/chills Eyes: No visual changes. ENT: No sore throat. Cardiovascular: Denies chest pain. Respiratory: Denies shortness of breath Gastrointestinal: No abdominal pain. No nausea, no vomiting. No diarrhea. No constipation. Genitourinary:  Negative for dysuria. Musculoskeletal: Negative for back pain. Positive right little toe pain.  Skin: Negative for rash. Neurological: Negative for headaches or numbness. Right leg weakness.   10-point ROS otherwise negative.  ____________________________________________   PHYSICAL EXAM:  VITAL SIGNS: ED Triage Vitals  Enc Vitals Group     BP 08/18/19 1550 (!) 135/58     Pulse Rate 08/18/19 1550 80     Resp 08/18/19 1550 18     Temp 08/18/19 1550 98.1 F (36.7 C)     Temp Source 08/18/19 1550 Oral     SpO2 08/18/19 1550 100 %     Weight 08/18/19 1549 135 lb (61.2 kg)     Height 08/18/19 1549 5\' 3"  (1.6 m)   Constitutional: Alert and oriented. Well appearing and in no acute distress. Eyes: Conjunctivae are normal. PERRL. EOMI. Head: Atraumatic. Nose: No congestion/rhinnorhea. Mouth/Throat: Mucous membranes are moist.  Neck: No stridor. Cardiovascular: Normal rate, regular rhythm. Good peripheral circulation. Grossly normal heart sounds.   Respiratory: Normal respiratory effort.  No retractions. Lungs CTAB. Gastrointestinal: Soft and nontender. No distention.  Musculoskeletal: Mild bruising at the distal fifth metatarsal on the right.  No clear deformity or severe swelling.  No tenderness to palpation over the ankle or knee.  Normal range of motion of bilateral hips. Neurologic:  Normal speech and language.  5 out of 5 strength with no pronator drift in the bilateral upper extremities. 5/5 strength with hip flexion/extension with normal LE sensation. Normal strength with plantar and dorsiflexion.  Skin:  Skin is warm, dry  and intact. No rash noted.  ____________________________________________   LABS (all labs ordered are listed, but only abnormal results are displayed)  Labs Reviewed  COMPREHENSIVE METABOLIC PANEL - Abnormal; Notable for the following components:      Result Value   Glucose, Bld 103 (*)    All other components within normal limits  URINALYSIS, ROUTINE W  REFLEX MICROSCOPIC - Abnormal; Notable for the following components:   APPearance HAZY (*)    Nitrite POSITIVE (*)    Leukocytes,Ua SMALL (*)    All other components within normal limits  URINALYSIS, MICROSCOPIC (REFLEX) - Abnormal; Notable for the following components:   Bacteria, UA MANY (*)    All other components within normal limits  SARS CORONAVIRUS 2 (TAT 6-24 HRS)  ETHANOL  PROTIME-INR  APTT  CBC  DIFFERENTIAL  RAPID URINE DRUG SCREEN, HOSP PERFORMED   ____________________________________________  RADIOLOGY  Ct Head Wo Contrast  Result Date: 08/18/2019 CLINICAL DATA:  Stroke suspected. Focal neurologic deficit of longer than 6 hours. EXAM: CT HEAD WITHOUT CONTRAST TECHNIQUE: Contiguous axial images were obtained from the base of the skull through the vertex without intravenous contrast. COMPARISON:  July 26, 2013 FINDINGS: Brain: No evidence of acute infarction, hemorrhage, hydrocephalus, extra-axial collection or mass lesion/mass effect. Moderate to advanced brain parenchymal volume loss. Advanced deep white matter microangiopathy Vascular: Calcific atherosclerotic disease. Skull: Normal. Negative for fracture or focal lesion. Sinuses/Orbits: No acute finding. Other: None. IMPRESSION: 1. No acute intracranial abnormality. 2. 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 Electronically Signed   By: Fidela Salisbury M.D.   On: 08/18/2019 17:47   Dg Foot Complete Right  Result Date: 08/18/2019 CLINICAL DATA:  Fifth toe pain after fall. EXAM: RIGHT FOOT COMPLETE - 3+ VIEW COMPARISON:  None. FINDINGS: Suspected acute nondisplaced intra-articular fracture through the medial base of the fifth proximal phalanx. No additional fracture. No dislocation. Joint spaces are preserved. Osteopenia. Soft tissue swelling around the fifth MTP joint. IMPRESSION: 1. Suspected acute nondisplaced intra-articular fracture through the medial base of the fifth  proximal phalanx. Electronically Signed   By: Titus Dubin M.D.   On: 08/18/2019 17:49    ____________________________________________   PROCEDURES  Procedure(s) performed:   Procedures  None  ____________________________________________   INITIAL IMPRESSION / ASSESSMENT AND PLAN / ED COURSE  Pertinent labs & imaging results that were available during my care of the patient were reviewed by me and considered in my medical decision making (see chart for details).   Patient presents to the emergency department for evaluation of acute onset right leg weakness with fall and gait disturbance since 8:52 PM yesterday.  Her lower extremity exam shows relatively equal strength in the bilateral lower extremities.  No sensory deficit.  Defer ambulation at this time.  No back pain.  No chest or abdomen discomfort.  Plan for initial CT imaging and lab work and will then ambulate for further information.  Labs and imaging reviewed.  Discussed the case with tele-neurology.  Patient's gait is abnormal but with the fracture in the right foot I would suspect this to some degree.  Patient gives a good history of initially having right leg weakness prior to her fall as opposed to weakness afterwards. Question TIA with no clear deficits at this time. No exam findings or history to suspect spine etiology. Tele-Neuro agrees with assessment. Post-op shoe applied for foot fx.  Discussed patient's case with [facility], Dr. Roel Cluck to request admission. Patient and family (if present) updated  with plan. Care transferred to [facility] service.  I reviewed all nursing notes, vitals, pertinent old records, EKGs, labs, imaging (as available).  EKG:    EKG Interpretation  Date/Time:  Friday August 18 2019 17:13:15 EDT Ventricular Rate:  79 PR Interval:    QRS Duration: 108 QT Interval:  389 QTC Calculation: 446 R Axis:   68 Text Interpretation:  Sinus rhythm Low voltage, precordial leads No STEMI  Confirmed by Alona Bene [phone]) on 08/18/2019 9:30:41 PM        ____________________________________________  FINAL CLINICAL IMPRESSION(S) / ED DIAGNOSES  Final diagnoses:  TIA (transient ischemic attack)  Fracture of unspecified metatarsal bone(s), right foot, initial encounter for closed fracture    Note:  This document was prepared using Dragon voice recognition software and may include unintentional dictation errors.  Alona Bene, MD, FACEP Emergency Medicine    Long, Arlyss Repress, MD 08/18/19 2117    Maia Plan, MD 08/18/19 2131

## 2019-08-18 NOTE — ED Notes (Signed)
teleneuro at bedside with pt. RN assisted with assessment.

## 2019-08-18 NOTE — Consult Note (Signed)
[facility] TeleNeurology Consult Services  Stat Consult  Date of Service:   08/18/2019 18:07:18  Impression:     .  Rule Out Acute Ischemic Stroke  Comments/Sign-Out: On exam she appears to have minimal right arm and right leg drift. I am concerned about a possible left brain stroke. I discussed this in detail with the patient. I would suggest getting an MRI of the head, MRA of the head and neck for TIA work-up. I would recommend starting her on full dosage of aspirin 325 daily. Depending on the results of these tests we will plan further care. She is not a candidate for alteplase because of duration of symptoms and has no clinical signs symptoms of large vessel occlusion  Metrics: [facility] Notification Time: 08/18/2019 18:05:27 Stamp Time: 08/18/2019 18:07:18 Callback Response Time: 08/18/2019 18:07:15 Video Start Time: 08/18/2019 18:22:42 Video End Time: 08/18/2019 18:33:58  Our recommendations are outlined below.  Recommendations:     .  Initiate Aspirin 325 MG Daily   Imaging Studies:     .  MRI Head  Disposition: Neurology Follow Up Recommended  Sign Out:     .  Discussed with Emergency Department Provider  ----------------------------------------------------------------------------------------------------  Chief Complaint: Recurrent falls  History of Present Illness: Patient is a 78 year old Female.  Extremely pleasant 78 year old female admitted to the hospital because of recurrent falls. She reports she has fallen few times. She came with injury to her right foot because of recurrent falls. She denies any issues with her speech or swallowing. Denies double vision. Denies any visual problems. Denies any numbness or tingling in her arms or legs. Denies syncopal episodes    Past Medical History:     . Hypertension     . Diabetes Mellitus     . There is NO history of Hyperlipidemia     . There is NO history of Atrial Fibrillation     .  There is NO history of Coronary Artery Disease     . There is NO history of Stroke  Anticoagulant use:  No  Antiplatelet use: No Examination: BP(120/52), Pulse(82), Blood Glucose(76) 1A: Level of Consciousness - Alert; keenly responsive + 0 1B: Ask Month and Age - Both Questions Right + 0 1C: Blink Eyes & Squeeze Hands - Performs Both Tasks + 0 2: Test Horizontal Extraocular Movements - Normal + 0 3: Test Visual Fields - No Visual Loss + 0 4: Test Facial Palsy (Use Grimace if Obtunded) - Normal symmetry + 0 5A: Test Left Arm Motor Drift - No Drift for 10 Seconds + 0 5B: Test Right Arm Motor Drift - Drift, but doesn't hit bed + 1 6A: Test Left Leg Motor Drift - No Drift for 5 Seconds + 0 6B: Test Right Leg Motor Drift - Drift, but doesn't hit bed + 1 7: Test Limb Ataxia (FNF/Heel-Shin) - No Ataxia + 0 8: Test Sensation - Normal; No sensory loss + 0 9: Test Language/Aphasia - Normal; No aphasia + 0 10: Test Dysarthria - Normal + 0 11: Test Extinction/Inattention - No abnormality + 0  NIHSS Score: 2  Patient/Family was informed the Neurology Consult would happen via TeleHealth consult by way of interactive audio and video telecommunications and consented to receiving care in this manner.  Due to the immediate potential for life-threatening deterioration due to underlying acute neurologic illness, I spent 25 minutes providing critical care. This time includes time for face to face visit via telemedicine, review of medical records, imaging studies and  discussion of findings with providers, the patient and/or family.   Dr Faustino Congress   [facility] [phone]  Case [id number]

## 2019-08-18 NOTE — Plan of Care (Signed)
78 yo F with DM HTN  Yesterday at 8-9 pm Right LE weakness tried to stand up but fell Had non displace FRX of her foot Has been ataxic today Now non focal CT head negative Tele  neurology neuro feels will need work up for TIA  COVID test penidng  Discussed with Dr. Laverta Baltimore Requested orthopedics consult regarding broken foot to have follow-up arranged  And please admit to observation telemetry  Would notify to neurology on arrival the patient Will need MRI and TIA work-up  Jaclyn Guzman 7:13 PM

## 2019-08-18 NOTE — ED Notes (Signed)
Pt assisted to BR via wheelchair- pt provided urine sample but not adequate amount. Will attempt sample again soon.

## 2019-08-18 NOTE — ED Notes (Signed)
Pt offered meal at this time- pt declined.

## 2019-08-18 NOTE — ED Notes (Signed)
Patient transported to CT 

## 2019-08-18 NOTE — ED Triage Notes (Signed)
Pt c/o weakness to right LE started last night-fell last night "several times"-pain and redness to right pinky toe-to triage in w/c-NAD

## 2019-08-19 ENCOUNTER — Encounter (HOSPITAL_COMMUNITY): Payer: Self-pay | Admitting: Internal Medicine

## 2019-08-19 ENCOUNTER — Observation Stay (HOSPITAL_COMMUNITY): Payer: Medicare Other

## 2019-08-19 ENCOUNTER — Observation Stay (HOSPITAL_BASED_OUTPATIENT_CLINIC_OR_DEPARTMENT_OTHER): Payer: Medicare Other

## 2019-08-19 DIAGNOSIS — G459 Transient cerebral ischemic attack, unspecified: Secondary | ICD-10-CM | POA: Diagnosis not present

## 2019-08-19 DIAGNOSIS — R296 Repeated falls: Secondary | ICD-10-CM | POA: Diagnosis not present

## 2019-08-19 DIAGNOSIS — R32 Unspecified urinary incontinence: Secondary | ICD-10-CM | POA: Diagnosis present

## 2019-08-19 DIAGNOSIS — I10 Essential (primary) hypertension: Secondary | ICD-10-CM | POA: Diagnosis present

## 2019-08-19 DIAGNOSIS — R531 Weakness: Secondary | ICD-10-CM | POA: Diagnosis not present

## 2019-08-19 DIAGNOSIS — E785 Hyperlipidemia, unspecified: Secondary | ICD-10-CM | POA: Diagnosis present

## 2019-08-19 DIAGNOSIS — S92514A Nondisplaced fracture of proximal phalanx of right lesser toe(s), initial encounter for closed fracture: Secondary | ICD-10-CM | POA: Diagnosis present

## 2019-08-19 LAB — TSH: TSH: 1.01 u[IU]/mL (ref 0.350–4.500)

## 2019-08-19 LAB — HEMOGLOBIN A1C
Hgb A1c MFr Bld: 6.2 % — ABNORMAL HIGH (ref 4.8–5.6)
Mean Plasma Glucose: 131.24 mg/dL

## 2019-08-19 LAB — GLUCOSE, CAPILLARY
Glucose-Capillary: 223 mg/dL — ABNORMAL HIGH (ref 70–99)
Glucose-Capillary: 144 mg/dL — ABNORMAL HIGH (ref 70–99)

## 2019-08-19 LAB — SARS CORONAVIRUS 2 (TAT 6-24 HRS): SARS Coronavirus 2: NEGATIVE

## 2019-08-19 LAB — CBG MONITORING, ED: Glucose-Capillary: 125 mg/dL — ABNORMAL HIGH (ref 70–99)

## 2019-08-19 LAB — ECHOCARDIOGRAM COMPLETE
Height: 63 in
Weight: 2160 oz

## 2019-08-19 MED ORDER — STROKE: EARLY STAGES OF RECOVERY BOOK
Freq: Once | Status: AC
  Administered 2019-08-19: 16:00:00
  Filled 2019-08-19: qty 1

## 2019-08-19 MED ORDER — ADULT MULTIVITAMIN W/MINERALS CH
1.0000 | ORAL_TABLET | Freq: Every day | ORAL | Status: DC
  Administered 2019-08-20: 1 via ORAL
  Filled 2019-08-19 (×2): qty 1

## 2019-08-19 MED ORDER — ACETAMINOPHEN 650 MG RE SUPP: 650.0000 mg | RECTAL | Status: DC | PRN

## 2019-08-19 MED ORDER — ACETAMINOPHEN 325 MG PO TABS: 650.0000 mg | ORAL_TABLET | ORAL | Status: DC | PRN

## 2019-08-19 MED ORDER — SENNOSIDES-DOCUSATE SODIUM 8.6-50 MG PO TABS: 1.0000 | ORAL_TABLET | Freq: Every evening | ORAL | Status: DC | PRN

## 2019-08-19 MED ORDER — ACETAMINOPHEN 160 MG/5ML PO SOLN: 650.0000 mg | ORAL | Status: DC | PRN

## 2019-08-19 MED ORDER — INSULIN ASPART 100 UNIT/ML ~~LOC~~ SOLN: 0.0000 [IU] | Freq: Three times a day (TID) | SUBCUTANEOUS | Status: DC

## 2019-08-19 MED ORDER — BOOST / RESOURCE BREEZE PO LIQD CUSTOM: 1.0000 | Freq: Two times a day (BID) | ORAL | Status: DC

## 2019-08-19 MED ORDER — ENOXAPARIN SODIUM 40 MG/0.4ML ~~LOC~~ SOLN
40.0000 mg | SUBCUTANEOUS | Status: DC
  Administered 2019-08-19: 40 mg via SUBCUTANEOUS
  Filled 2019-08-19: qty 0.4

## 2019-08-19 MED ORDER — IOHEXOL 350 MG/ML SOLN
80.0000 mL | Freq: Once | INTRAVENOUS | Status: AC | PRN
  Administered 2019-08-19: 100 mL via INTRAVENOUS

## 2019-08-19 MED ORDER — ATORVASTATIN CALCIUM 40 MG PO TABS
40.0000 mg | ORAL_TABLET | Freq: Every day | ORAL | Status: DC
  Administered 2019-08-20: 40 mg via ORAL
  Filled 2019-08-19 (×2): qty 1

## 2019-08-19 MED ORDER — ASPIRIN EC 81 MG PO TBEC
81.0000 mg | DELAYED_RELEASE_TABLET | Freq: Every day | ORAL | Status: DC
  Administered 2019-08-20: 11:00:00 81 mg via ORAL
  Filled 2019-08-19 (×2): qty 1

## 2019-08-19 MED ORDER — SODIUM CHLORIDE 0.9 % IV SOLN
INTRAVENOUS | Status: DC
  Administered 2019-08-19: 16:00:00 via INTRAVENOUS

## 2019-08-19 MED ORDER — INSULIN ASPART 100 UNIT/ML ~~LOC~~ SOLN: 0.0000 [IU] | Freq: Every day | SUBCUTANEOUS | Status: DC

## 2019-08-19 MED ORDER — DARIFENACIN HYDROBROMIDE ER 7.5 MG PO TB24
7.5000 mg | ORAL_TABLET | Freq: Every day | ORAL | Status: DC
  Administered 2019-08-20: 7.5 mg via ORAL
  Filled 2019-08-19: qty 1

## 2019-08-19 NOTE — H&P (Signed)
History and Physical    NAHIA Guzman YDX:[id number] DOB: [birth year] DOA: 08/18/2019  PCP: Wenda Low, MD Consultants: Mayer Camel - orthopedics; Junious Silk - urology Patient coming from:  Home - lives alone; NOK: Ruthell Rummage, [phone]; [place], [place], [phone]  Chief Complaint: Fall  HPI: Jaclyn Guzman is a 78 y.o. female with medical history significant of DM and HTN presenting with a fall.    She fell on Friday.  She lost her balance.  She denies feeling light-headed or dizzy.  She injured her little toe.  No n/w/t of arms or legs.  No headache.  No visual disturbance.  No dysphagia or dysarthria.  After she fell, she noticed difficulty walking.  She denies lightheadedness or ataxia after falling and thinks her ambulatory difficulty may have been due to her broken toe.  She feels fine now.   ED Course:  [facility] transfer, per Dr. Roel Cluck:  Yesterday at 8-9 pm Right LE weakness tried to stand up but fell with resultant non-displaced fracture of her foot.  +ataxia.  Non-focal exam.  CT head negative. Tele-neurology feels will need work up for TIA  COVID test penidng  Discussed with Dr. Laverta Baltimore Requested orthopedics consult regarding broken foot to have follow-up arranged  Review of Systems: As per HPI; otherwise review of systems reviewed and negative.   Ambulatory Status:  Ambulates with cane  Past Medical History:  Diagnosis Date  . Diabetes mellitus   . Dyslipidemia   . Hypertension   . Osteoporosis   . Urinary incontinence     History reviewed. No pertinent surgical history.  Social History   Socioeconomic History  . Marital status: Single    Spouse name: Not on file  . Number of children: Not on file  . Years of education: Not on file  . Highest education level: Not on file  Occupational History  . Occupation: retired  Scientific laboratory technician  . Financial resource strain: Not on file  . Food insecurity    Worry: Not on file    Inability: Not on file   . Transportation needs    Medical: Not on file    Non-medical: Not on file  Tobacco Use  . Smoking status: Never Smoker  . Smokeless tobacco: Never Used  Substance and Sexual Activity  . Alcohol use: No  . Drug use: No  . Sexual activity: Not on file  Lifestyle  . Physical activity    Days per week: Not on file    Minutes per session: Not on file  . Stress: Not on file  Relationships  . Social Herbalist on phone: Not on file    Gets together: Not on file    Attends religious service: Not on file    Active member of club or organization: Not on file    Attends meetings of clubs or organizations: Not on file    Relationship status: Not on file  . Intimate partner violence    Fear of current or ex partner: Not on file    Emotionally abused: Not on file    Physically abused: Not on file    Forced sexual activity: Not on file  Other Topics Concern  . Not on file  Social History Narrative  . Not on file    Allergies  Allergen Reactions  . Amoxicillin-Pot Clavulanate Hives  . Meloxicam   . Toviaz [Fesoterodine Fumarate Er]     Family History  Problem Relation Age of Onset  .  Breast cancer Neg Hx   . Stroke Neg Hx     Prior to Admission medications   Medication Sig Start Date End Date Taking? Authorizing Provider  aspirin 81 MG tablet Take 81 mg by mouth daily.      [provider]  atorvastatin (LIPITOR) 20 MG tablet Take 20 mg by mouth daily.      [provider]  metFORMIN (GLUCOPHAGE) 500 MG tablet Take 500 mg by mouth 2 (two) times daily with a meal.      [provider]  ramipril (ALTACE) 2.5 MG capsule Take 2.5 mg by mouth daily.      [provider]    Physical Exam: Vitals:   08/19/19 0830 08/19/19 1013 08/19/19 1203 08/19/19 1252  BP: (!) 117/48 125/62 125/61 117/73  Pulse: 72 72 73 84  Resp: 18 18 17    Temp:  98.4 F (36.9 C) 98.5 F (36.9 C)   TempSrc:  Oral Oral   SpO2: 98% 100% 100%   Weight:       Height:         . General:  Appears calm and comfortable and is NAD . Eyes:  EOMI, normal lids, iris . ENT:  grossly normal hearing, lips & tongue, mmm . Neck:  no LAD, masses or thyromegaly; no carotid bruits . Cardiovascular:  RRR, no m/r/g. No LE edema.  Respiratory:   CTA bilaterally with no wheezes/rales/rhonchi.  Normal respiratory effort. . Abdomen:  soft, NT, ND, NABS . Skin:  no rash or induration seen on limited exam . Musculoskeletal:  grossly normal tone BUE/BLE, good ROM, no bony abnormality . Psychiatric:  grossly normal mood and affect, speech fluent and appropriate, AOx3 . Neurologic:  CN 2-12 grossly intact with ?subtle left facial droop (patient reports this looks normal), moves all extremities in coordinated fashion, sensation intact    Radiological Exams on Admission: Ct Head Wo Contrast  Result Date: 08/18/2019 CLINICAL DATA:  Stroke suspected. Focal neurologic deficit of longer than 6 hours. EXAM: CT HEAD WITHOUT CONTRAST TECHNIQUE: Contiguous axial images were obtained from the base of the skull through the vertex without intravenous contrast. COMPARISON:  July 26, 2013 FINDINGS: Brain: No evidence of acute infarction, hemorrhage, hydrocephalus, extra-axial collection or mass lesion/mass effect. Moderate to advanced brain parenchymal volume loss. Advanced deep white matter microangiopathy Vascular: Calcific atherosclerotic disease. Skull: Normal. Negative for fracture or focal lesion. Sinuses/Orbits: No acute finding. Other: None. IMPRESSION: 1. No acute intracranial abnormality. 2. 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 Electronically Signed   By: July 28, 2013 M.D.   On: 08/18/2019 17:47   Dg Foot Complete Right  Result Date: 08/18/2019 CLINICAL DATA:  Fifth toe pain after fall. EXAM: RIGHT FOOT COMPLETE - 3+ VIEW COMPARISON:  None. FINDINGS: Suspected acute nondisplaced intra-articular fracture through the  medial base of the fifth proximal phalanx. No additional fracture. No dislocation. Joint spaces are preserved. Osteopenia. Soft tissue swelling around the fifth MTP joint. IMPRESSION: 1. Suspected acute nondisplaced intra-articular fracture through the medial base of the fifth proximal phalanx. Electronically Signed   By: 10/18/2019 M.D.   On: 08/18/2019 17:49    EKG: Independently reviewed.  NSR with rate 79; low voltage with no evidence of acute ischemia   Labs on Admission: I have personally reviewed the available labs and imaging studies at the time of the admission.  Pertinent labs:   Glucose 103 Normal CMP otherwise Normal CBC INR 1.0 UA: small LE, +  nitrite, many bacteria ETOH <10 UDS negative COVID negative   Assessment/Plan Principal Problem:   TIA (transient ischemic attack) Active Problems:   Urinary incontinence   Hypertension   Dyslipidemia   Nondisplaced fracture of proximal phalanx of right lesser toe(s), initial encounter for closed fracture   TIA -Patient presenting after a fall at home -Widely variable history about whether there were neurologic symptoms present in the days leading up to the event; to me, she specifically reported none -Will place in observation status for CVA/TIA evaluation -Telemetry monitoring -MRI -CTA -Echo -Risk stratification with FLP, A1c; will also check TSH and UDS -ASA daily -Neurology consult -PT/OT/ST/Nutrition Consults  HTN -Allow permissive HTN for now -Treat BP only if >220/120, and then with goal of 15% reduction -Hold Altace and plan to restart in 48-72 hours   HLD -Check FLP -Resume statin but will increase Lipitor to 40 mg daily   DM -Check A1c -Hold home PO Metformin -Will order moderate-scale SSI  Urinary incontinence -Awaiting Med Rec completion in order to order, but she did report taking medication for this issue  Toe fracture -Buddy taping, if helpful -Hard shoe provided -Unlikely to need  additional intervention    Note: This patient has been tested and is negative for the novel coronavirus COVID-19.     DVT prophylaxis:  Lovenox  Code Status: Full - confirmed with patient/family Family Communication: Daughter present throughout evaluation Disposition Plan:  Home once clinically improved Consults called: Neurology; PT/OT/ST/SW/Nutrition  Admission status: It is my clinical opinion that referral for OBSERVATION is reasonable and necessary in this patient based on the above information provided. The aforementioned taken together are felt to place the patient at high risk for further clinical deterioration. However it is anticipated that the patient may be medically stable for discharge from the hospital within 24 to 48 hours.    Jonah BlueJennifer Aidden Markovic MD [facility]   How to contact the [facility] Attending or Consulting provider 7A - 7P or covering provider during after hours 7P -7A, for this patient?  1. Check the care team in [facility] and look for a) attending/consulting [facility] provider listed and b) the [facility] team listed 2. Log into [email or web address] and use [place]'s universal password to access. If you do not have the password, please contact the hospital operator. 3. Locate the [facility] provider you are looking for under [facility] and page to a number that you can be directly reached. 4. If you still have difficulty reaching the provider, please page the [facility] (Director on Call) for the Hospitalists listed on amion for assistance.   08/19/2019, 1:32 PM

## 2019-08-19 NOTE — Care Management Obs Status (Signed)
[facility] NOTIFICATION   Patient Details  Name: Jaclyn Guzman MRN: [id number] Date of Birth: [birth year]   Medicare Observation Status Notification Given:  Yes    Carles Collet, RN 08/19/2019, 1:51 PM

## 2019-08-19 NOTE — ED Notes (Signed)
Rounded on pt at this time- pt informed of no bed at this time. Offered pt food again and pt declined. Pt resting on stretcher.

## 2019-08-19 NOTE — ED Notes (Signed)
ED TO INPATIENT HANDOFF REPORT  ED Nurse Name and Phone #: [phone] [facility]. Please call with report questions  S Name/Age/Gender Jaclyn Guzman 78 y.o. female Room/Bed: MH05/MH05  Code Status   Code Status: Not on file  Home/SNF/Other Home Patient oriented to: self, place, time and situation Is this baseline? Yes   Triage Complete: Triage complete  Chief Complaint FALL AND WEAKNESS  Triage Note Pt c/o weakness to right LE started last night-fell last night "several times"-pain and redness to right pinky toe-to triage in w/c-NAD   Allergies Allergies  Allergen Reactions  . Amoxicillin-Pot Clavulanate Hives  . Meloxicam   . Toviaz [Fesoterodine Fumarate Er]     Level of Care/Admitting Diagnosis ED Disposition    ED Disposition Condition Comment   Admit  Hospital Area: [place] [[id number]]  Level of Care: Telemetry Medical [104]  I expect the patient will be discharged within 24 hours: No (not a candidate for 5C-Observation unit)  Covid Evaluation: Asymptomatic Screening Protocol (No Symptoms)  Diagnosis: TIA (transient ischemic attack) [563875]  Admitting Physician: Toy Baker [[id number]]  Attending Physician: Toy Baker [[id number]]  Bed request comments: neurobed  PT Class (Do Not Modify): Observation [104]  PT Acc Code (Do Not Modify): Observation [10022]       B Medical/Surgery History Past Medical History:  Diagnosis Date  . Diabetes mellitus    History reviewed. No pertinent surgical history.   A IV Location/Drains/Wounds Patient Lines/Drains/Airways Status   Active Line/Drains/Airways    Name:   Placement date:   Placement time:   Site:   Days:   Peripheral IV 08/18/19 Left Hand   08/18/19    1708    Hand   1   Peripheral IV 08/18/19 Left Antecubital   08/18/19    1718    Antecubital   1   External Urinary Catheter   08/18/19    1838    -   1          Intake/Output Last 24 hours No intake or output data in the 24  hours ending 08/19/19 6433  Labs/Imaging Results for orders placed or performed during the hospital encounter of 08/18/19 (from the past 48 hour(s))  Ethanol     Status: None   Collection Time: 08/18/19  5:15 PM  Result Value Ref Range   Alcohol, Ethyl (B) <10 <10 mg/dL    Comment: (NOTE) Lowest detectable limit for serum alcohol is 10 mg/dL. For medical purposes only. Performed at [facility], [address] [place], [place] [address]   Protime-INR     Status: None   Collection Time: 08/18/19  5:15 PM  Result Value Ref Range   Prothrombin Time 12.9 11.4 - 15.2 seconds   INR 1.0 0.8 - 1.2    Comment: (NOTE) INR goal varies based on device and disease states. Performed at [facility], [address] [place], [place] [address]   APTT     Status: None   Collection Time: 08/18/19  5:15 PM  Result Value Ref Range   aPTT 25 24 - 36 seconds    Comment: Performed at [facility], [address] [place], [place] [address]  CBC     Status: None   Collection Time: 08/18/19  5:15 PM  Result Value Ref Range   WBC 6.5 4.0 - 10.5 K/uL   RBC 4.11 3.87 - 5.11 MIL/uL   Hemoglobin 13.1 12.0 - 15.0 g/dL  HCT 39.4 36.0 - 46.0 %   MCV 95.9 80.0 - 100.0 fL   MCH 31.9 26.0 - 34.0 pg   MCHC 33.2 30.0 - 36.0 g/dL   RDW 38.4 53.6 - 46.8 %   Platelets 226 150 - 400 K/uL   nRBC 0.0 0.0 - 0.2 %    Comment: Performed at [facility], [address] [place], [place] [address]  Differential     Status: None   Collection Time: 08/18/19  5:15 PM  Result Value Ref Range   Neutrophils Relative % 58 %   Neutro Abs 3.8 1.7 - 7.7 K/uL   Lymphocytes Relative 31 %   Lymphs Abs 2.0 0.7 - 4.0 K/uL   Monocytes Relative 8 %   Monocytes Absolute 0.5 0.1 - 1.0 K/uL   Eosinophils Relative 2 %   Eosinophils Absolute 0.1 0.0 - 0.5 K/uL   Basophils Relative 1 %   Basophils Absolute 0.0 0.0 - 0.1 K/uL   Immature Granulocytes 0 %   Abs Immature Granulocytes 0.01  0.00 - 0.07 K/uL    Comment: Performed at [facility], [address] [place], [place] [address]  Comprehensive metabolic panel     Status: Abnormal   Collection Time: 08/18/19  5:15 PM  Result Value Ref Range   Sodium 136 135 - 145 mmol/L   Potassium 4.0 3.5 - 5.1 mmol/L   Chloride 101 98 - 111 mmol/L   CO2 24 22 - 32 mmol/L   Glucose, Bld 103 (H) 70 - 99 mg/dL   BUN 19 8 - 23 mg/dL   Creatinine, Ser 0.03 0.44 - 1.00 mg/dL   Calcium 9.6 8.9 - 70.4 mg/dL   Total Protein 6.7 6.5 - 8.1 g/dL   Albumin 4.2 3.5 - 5.0 g/dL   AST 18 15 - 41 U/L   ALT 18 0 - 44 U/L   Alkaline Phosphatase 41 38 - 126 U/L   Total Bilirubin 0.7 0.3 - 1.2 mg/dL   GFR calc non Af Amer >60 >60 mL/min   GFR calc Af Amer >60 >60 mL/min   Anion gap 11 5 - 15    Comment: Performed at [facility], [address] [place], [place] [address]  Urine rapid drug screen (hosp performed)     Status: None   Collection Time: 08/18/19  5:15 PM  Result Value Ref Range   Opiates NONE DETECTED NONE DETECTED   Cocaine NONE DETECTED NONE DETECTED   Benzodiazepines NONE DETECTED NONE DETECTED   Amphetamines NONE DETECTED NONE DETECTED   Tetrahydrocannabinol NONE DETECTED NONE DETECTED   Barbiturates NONE DETECTED NONE DETECTED    Comment: (NOTE) DRUG SCREEN FOR MEDICAL PURPOSES ONLY.  IF CONFIRMATION IS NEEDED FOR ANY PURPOSE, NOTIFY LAB WITHIN 5 DAYS. LOWEST DETECTABLE LIMITS FOR URINE DRUG SCREEN Drug Class                     Cutoff (ng/mL) Amphetamine and metabolites    1000 Barbiturate and metabolites    200 Benzodiazepine                 200 Tricyclics and metabolites     300 Opiates and metabolites        300 Cocaine and metabolites        300 THC  50 Performed at [facility] [place], [address] [place], [place] [address]   Urinalysis, Routine w reflex microscopic     Status: Abnormal   Collection Time: 08/18/19  5:15 PM  Result Value Ref Range    Color, Urine YELLOW YELLOW   APPearance HAZY (A) CLEAR   Specific Gravity, Urine 1.010 1.005 - 1.030   pH 6.0 5.0 - 8.0   Glucose, UA NEGATIVE NEGATIVE mg/dL   Hgb urine dipstick NEGATIVE NEGATIVE   Bilirubin Urine NEGATIVE NEGATIVE   Ketones, ur NEGATIVE NEGATIVE mg/dL   Protein, ur NEGATIVE NEGATIVE mg/dL   Nitrite POSITIVE (A) NEGATIVE   Leukocytes,Ua SMALL (A) NEGATIVE    Comment: Performed at [facility] [place], [address] [place], [place] [address]  SARS CORONAVIRUS 2 (TAT 6-24 HRS) Nasopharyngeal     Status: None   Collection Time: 08/18/19  5:15 PM   Specimen: Nasopharyngeal  Result Value Ref Range   SARS Coronavirus 2 NEGATIVE NEGATIVE    Comment: (NOTE) SARS-CoV-2 target nucleic acids are NOT DETECTED. The SARS-CoV-2 RNA is generally detectable in upper and lower respiratory specimens during the acute phase of infection. Negative results do not preclude SARS-CoV-2 infection, do not rule out co-infections with other pathogens, and should not be used as the sole basis for treatment or other patient management decisions. Negative results must be combined with clinical observations, patient history, and epidemiological information. The expected result is Negative. Fact Sheet for Patients: HairSlick.nohttps://www.fda.gov/media/138098/download Fact Sheet for Healthcare Providers: quierodirigir.comhttps://www.fda.gov/media/138095/download This test is not yet approved or cleared by the [place] FDA and  has been authorized for detection and/or diagnosis of SARS-CoV-2 by FDA under an Emergency Use Authorization (EUA). This EUA will remain  in effect (meaning this test can be used) for the duration of the COVID-19 declaration under Section 56 4(b)(1) of the Act, 21 U.S.C. section 360bbb-3(b)(1), unless the authorization is terminated or revoked sooner. Performed at [facility], [address] [place], [place] [address]   Urinalysis, Microscopic (reflex)     Status: Abnormal    Collection Time: 08/18/19  5:15 PM  Result Value Ref Range   RBC / HPF 0-5 0 - 5 RBC/hpf   WBC, UA 0-5 0 - 5 WBC/hpf   Bacteria, UA MANY (A) NONE SEEN   Squamous Epithelial / LPF 0-5 0 - 5    Comment: Performed at [facility] [place], [address] [place], [place] [address]  CBG monitoring, ED     Status: Abnormal   Collection Time: 08/19/19  7:45 AM  Result Value Ref Range   Glucose-Capillary 125 (H) 70 - 99 mg/dL   Ct Head Wo Contrast  Result Date: 08/18/2019 CLINICAL DATA:  Stroke suspected. Focal neurologic deficit of longer than 6 hours. EXAM: CT HEAD WITHOUT CONTRAST TECHNIQUE: Contiguous axial images were obtained from the base of the skull through the vertex without intravenous contrast. COMPARISON:  July 26, 2013 FINDINGS: Brain: No evidence of acute infarction, hemorrhage, hydrocephalus, extra-axial collection or mass lesion/mass effect. Moderate to advanced brain parenchymal volume loss. Advanced deep white matter microangiopathy Vascular: Calcific atherosclerotic disease. Skull: Normal. Negative for fracture or focal lesion. Sinuses/Orbits: No acute finding. Other: None. IMPRESSION: 1. No acute intracranial abnormality. 2. 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 Electronically Signed   By: Ted Mcalpineobrinka  Dimitrova M.D.   On: 08/18/2019 17:47   Dg Foot Complete Right  Result Date: 08/18/2019 CLINICAL DATA:  Fifth toe  pain after fall. EXAM: RIGHT FOOT COMPLETE - 3+ VIEW COMPARISON:  None. FINDINGS: Suspected acute nondisplaced intra-articular fracture through the medial base of the fifth proximal phalanx. No additional fracture. No dislocation. Joint spaces are preserved. Osteopenia. Soft tissue swelling around the fifth MTP joint. IMPRESSION: 1. Suspected acute nondisplaced intra-articular fracture through the medial base of the fifth proximal phalanx. Electronically Signed   By: Obie Dredge M.D.   On: 08/18/2019 17:49    Pending  Labs Unresulted Labs (From admission, onward)   None      Vitals/Pain Today's Vitals   08/19/19 0600 08/19/19 0630 08/19/19 0700 08/19/19 0702  BP: (!) 133/59 120/70 139/69   Pulse: 65 71 73   Resp:   16   Temp:      TempSrc:      SpO2: 99% 97% 98%   Weight:      Height:      PainSc:    Asleep    Isolation Precautions No active isolations  Medications Medications - No data to display  Mobility walks with device High fall risk   Focused Assessments .   R Recommendations: See Admitting Provider Note  Report given to:   Additional Notes:

## 2019-08-19 NOTE — Progress Notes (Signed)
Echocardiogram 2D Echocardiogram has been performed.  Oneal Deputy Garo Heidelberg 08/19/2019, 2:56 PM

## 2019-08-19 NOTE — Progress Notes (Signed)
[facility] admissions notified of patient arriving and awaiting orders.

## 2019-08-19 NOTE — Progress Notes (Signed)
Initial Nutrition Assessment  DOCUMENTATION CODES:   Not applicable  INTERVENTION:  -Boost Breeze po BID, each supplement provides 250 kcal and 9 grams of protein (noted allergy with Ensure) -MVI daily  NUTRITION DIAGNOSIS:   Increased nutrient needs related to acute illness(TIA) as evidenced by estimated needs.  GOAL:   Patient will meet greater than or equal to 90% of their needs   MONITOR:   PO intake, Labs, I & O's, Weight trends, Supplement acceptance  REASON FOR ASSESSMENT:   Consult Other (Comment)(TIA)  ASSESSMENT:  RD working remotely.  78 year old female with past medical history significant of DM, HTN, osteoporosis and urinary incontinence. She presented to ED s/p a fall on Friday with complaints of toe pain. Patient noted with RLE weakness and +ataxia; neurology recommending work up for TIA  Patient with right toe fx and admitted for TIA observation  CT head: negative MRI pending  Per chart review, patient lives alone and she started noticing multiple falls with right leg weakness beginning sometime Thursday morning. She presented [facility] ER at [facility]; concerns for small stroke; imaging recommended and transported to [facility] for further testing.  Unable to obtain nutrition history at this time; per chart patient currently with RDCS for ECHO No recorded meals at this time; diet advanced to HH/CM this afternoon. Will provide Ensure to aid with calorie and protein needs and continue to monitor for po intake.   Current wt 61.2 kg (134.6 lb) No weight history available for review; noted 60.8 kg on 9/05  Medications and labs reviewed   NUTRITION - FOCUSED PHYSICAL EXAM: Unable to complete at this time; RD working remotely.  Diet Order:   Diet Order            Diet heart healthy/carb modified Fluid consistency: Thin  Diet effective ____              EDUCATION NEEDS:   No education needs have been identified at this time  Skin:  Skin Assessment: Reviewed RN  Assessment(MASD; groin)  Last BM:  10/9  Height:   Ht Readings from Last 1 Encounters:  08/18/19 5\' 3"  (1.6 m)    Weight:   Wt Readings from Last 1 Encounters:  08/18/19 61.2 kg    Ideal Body Weight:  52.3 kg  BMI:  Body mass index is 23.91 kg/m.  Estimated Nutritional Needs:   Kcal:  1530-1700  Protein:  73-85  Fluid:  >/= 1.5 L/day  Lajuan Lines, RD, LDN Clinical Nutrition [facility] Telephone [phone] After Hours/Weekend Pager: [phone]

## 2019-08-19 NOTE — Progress Notes (Signed)
Orthopedic Tech Progress Note Patient Details:  Jaclyn Guzman 1941-07-19 [id number]  Ortho Devices Type of Ortho Device: Buddy tape Ortho Device/Splint Location: LRE Ortho Device/Splint Interventions: Adjustment, Application, Ordered   Post Interventions Patient Tolerated: Well Instructions Provided: Care of device, Adjustment of device   [name] 08/19/2019, 3:31 PM

## 2019-08-19 NOTE — Evaluation (Signed)
Occupational Therapy Evaluation Patient Details Name: Jaclyn Guzman MRN: [id number] DOB: 1941-06-09 Today's Date: 08/19/2019    History of Present Illness Patient is a 78 year old female admitted with reports of frequent falls and right sided weakness. Patient found to have right foot toe fracture. CT of head showing no acute changes. MRI pending. PMH includes: HTN, DM, HLD   Clinical Impression   PTA, pt was living alone and was independent with ADLs and IADLs; using cane with mobility in community. Pt currently requiring Min A for LB ADLs and functional transfers for balance and safety. Pt would benefit from further acute OT to facilitate safe dc. Recommend dc to home with HHOT for further OT to optimize safety, independence with ADLs, and return to PLOF.      Follow Up Recommendations  Home health OT;Supervision - Intermittent    Equipment Recommendations  3 in 1 bedside commode    Recommendations for Other Services PT consult     Precautions / Restrictions Precautions Precautions: Fall Required Braces or Orthoses: Other Brace Other Brace: walking boot on right; pt reports they may switch to a smaller boot. Restrictions Weight Bearing Restrictions: No      Mobility Bed Mobility Overal bed mobility: Needs Assistance Bed Mobility: Supine to Sit     Supine to sit: Min assist     General bed mobility comments: Min A as pt close to edge and presenting with decreased safety awareness  Transfers Overall transfer level: Needs assistance Equipment used: Rolling walker (2 wheeled) Transfers: Sit to/from [employer] Sit to Stand: Min assist Stand pivot transfers: Min assist;+2 physical assistance;+2 safety/equipment       General transfer comment: Min A for maintaining balance with seonc person to assist with manaing RLE. Pt agreeable to transfer with maintaining NWB status as she did not want to don boot    Balance Overall balance assessment: Needs  assistance Sitting-balance support: Feet supported Sitting balance-Leahy Scale: Good     Standing balance support: Bilateral upper extremity supported Standing balance-Leahy Scale: Fair Standing balance comment: requires support for standing balance                           ADL either performed or assessed with clinical judgement   ADL Overall ADL's : Needs assistance/impaired Eating/Feeding: Independent;Sitting   Grooming: Set up;Sitting   Upper Body Bathing: Set up;Sitting   Lower Body Bathing: Minimal assistance;Sit to/from stand   Upper Body Dressing : Set up;Sitting   Lower Body Dressing: Minimal assistance;Sit to/from stand   Toilet Transfer: Minimal assistance;+2 for physical assistance;Stand-pivot;RW(Simulated to recliner) Toilet Transfer Details (indicate cue type and reason): Min A for balance and safety; second person to maintain NWB as pt not wanting to wear boot         Functional mobility during ADLs: Minimal assistance;+2 for physical assistance;Rolling walker(stand pivot only) General ADL Comments: Pt requiring increased assistance for LB ADLs and functional transfers due to [facility] status. Pt not wanting to wear boot but agreeable to transfer to chair. Pt requiring Min A +2 for maintaining NWB during transfer     Vision Baseline Vision/History: Wears glasses Wears Glasses: At all times Patient Visual Report: No change from baseline       Perception     Praxis      Pertinent Vitals/Pain Pain Assessment: No/denies pain     Hand Dominance Right   Extremity/Trunk Assessment Upper Extremity Assessment Upper Extremity Assessment: Overall  WFL for tasks assessed   Lower Extremity Assessment Lower Extremity Assessment: RLE deficits/detail;Defer to PT evaluation RLE Deficits / Details: right foot toe fracture.    Cervical / Trunk Assessment Cervical / Trunk Assessment: Normal   Communication Communication Communication: No difficulties    Cognition Arousal/Alertness: Awake/alert Behavior During Therapy: Flat affect Overall Cognitive Status: No family/caregiver present to determine baseline cognitive functioning Area of Impairment: Attention;Safety/judgement                   Current Attention Level: Alternating;Selective     Safety/Judgement: Decreased awareness of safety     General Comments: Pt tangiental in thought and presenting with poor attention.   General Comments  VSS. RN arriving at end of session    Exercises     Shoulder Instructions      Home Living Family/patient expects to be discharged to:: Private residence Living Arrangements: Alone Available Help at Discharge: Family;Friend(s);Available PRN/intermittently Type of Home: House Home Access: Stairs to enter [employer] of Steps: 2   Home Layout: One level     Bathroom Shower/Tub: Occupational psychologist: Standard     Home Equipment: Cane - single point          Prior Functioning/Environment Level of Independence: Independent with assistive device(s)        Comments: Reports she only used cane when out of the home.        OT Problem List: Decreased activity tolerance;Impaired balance (sitting and/or standing);Decreased cognition;Decreased safety awareness;Decreased knowledge of use of DME or AE;Decreased knowledge of precautions      OT Treatment/Interventions: Self-care/ADL training;Therapeutic exercise;Energy conservation;DME and/or AE instruction;Therapeutic activities;Patient/family education    OT Goals(Current goals can be found in the care plan section) Acute Rehab OT Goals Patient Stated Goal: none stated OT Goal Formulation: With patient Time For Goal Achievement: 09/02/19 Potential to Achieve Goals: Good  OT Frequency: Min 2X/week   Barriers to D/C:            Co-evaluation              AM-PAC OT "6 Clicks" Daily Activity     Outcome Measure Help from another person  eating meals?: None Help from another person taking care of personal grooming?: A Little Help from another person toileting, which includes using toliet, bedpan, or urinal?: A Little Help from another person bathing (including washing, rinsing, drying)?: A Little Help from another person to put on and taking off regular upper body clothing?: None Help from another person to put on and taking off regular lower body clothing?: A Little 6 Click Score: 20   End of Session Equipment Utilized During Treatment: Rolling walker Nurse Communication: Mobility status;Other (comment)(maintained NWB status)  Activity Tolerance: Patient tolerated treatment well Patient left: in chair;with call bell/phone within reach  OT Visit Diagnosis: Unsteadiness on feet (R26.81);Other abnormalities of gait and mobility (R26.89);Muscle weakness (generalized) (M62.81);Other symptoms and signs involving cognitive function                Time: 5732-2025 OT Time Calculation (min): 14 min Charges:  OT General Charges $OT Visit: 1 Visit OT Evaluation $OT Eval Moderate Complexity: Pease, OTR/L Acute Rehab Pager: [phone] Office: [place] 08/19/2019, 4:18 PM

## 2019-08-19 NOTE — Evaluation (Signed)
Physical Therapy Evaluation Patient Details Name: Jaclyn Guzman MRN: [id number] DOB: Feb 15, 1941 Today's Date: 08/19/2019   History of Present Illness  Patient is a 78 year old female admitted with reports of frequent falls and right sided weakness. Patient found to have right foot toe fracture. PMH includes: HTN, DM, HLD  Clinical Impression  Patient received in bed, agrees to PT evaluation. Patient is independent with bed mobility, decreased awareness of lines, use of boot. Requires min assist/guard for sit to stand and standing balance. Ambulated 25 feet with RW and min guard assist. Patient will benefit from continued skilled PT prior to discharge to ensure patient's safe discharge plan. She lives alone.      Follow Up Recommendations Home health PT    Equipment Recommendations  Rolling walker with 5" wheels    Recommendations for Other Services       Precautions / Restrictions Precautions Precautions: Fall Required Braces or Orthoses: Other Brace Other Brace: walking boot on right Restrictions Weight Bearing Restrictions: No      Mobility  Bed Mobility Overal bed mobility: Independent             General bed mobility comments: assist needed to manage lines  Transfers Overall transfer level: Needs assistance Equipment used: Rolling walker (2 wheeled) Transfers: Sit to/from Stand Sit to Stand: Min guard         General transfer comment: slightly unsteady with initial standing balance.  Ambulation/Gait Ambulation/Gait assistance: Min guard Gait Distance (Feet): 25 Feet Assistive device: Rolling walker (2 wheeled) Gait Pattern/deviations: WFL(Within Functional Limits);Step-to pattern Gait velocity: decreased   General Gait Details: reports boot is heavy and making walking more difficult. She wanted to leave pure wick hooked up, which limited walking distance.  Stairs            Wheelchair Mobility    Modified Rankin (Stroke Patients Only)       Balance Overall balance assessment: Needs assistance Sitting-balance support: Feet supported Sitting balance-Leahy Scale: Good     Standing balance support: Bilateral upper extremity supported Standing balance-Leahy Scale: Fair Standing balance comment: requires support for standing balance                             Pertinent Vitals/Pain Pain Assessment: No/denies pain    Home Living Family/patient expects to be discharged to:: Private residence Living Arrangements: Alone Available Help at Discharge: Family;Friend(s);Available PRN/intermittently Type of Home: House Home Access: Stairs to enter   [employer] of Steps: 2 Home Layout: One level Home Equipment: Cane - single point      Prior Function Level of Independence: Independent with assistive device(s)         Comments: Reports she only used cane when out of the home.     Hand Dominance        Extremity/Trunk Assessment   Upper Extremity Assessment Upper Extremity Assessment: Overall WFL for tasks assessed    Lower Extremity Assessment Lower Extremity Assessment: Generalized weakness    Cervical / Trunk Assessment Cervical / Trunk Assessment: Normal  Communication   Communication: No difficulties  Cognition Arousal/Alertness: Awake/alert Behavior During Therapy: Flat affect Overall Cognitive Status: No family/caregiver present to determine baseline cognitive functioning                                 General Comments: seems a bit "off" trying to get boot  off, told her 3x to leave it on      General Comments      Exercises     Assessment/Plan    PT Assessment Patient needs continued PT services  PT Problem List Decreased strength;Decreased mobility;Decreased safety awareness;Decreased activity tolerance;Decreased balance;Decreased knowledge of use of DME       PT Treatment Interventions DME instruction;Therapeutic activities;Gait  training;Therapeutic exercise;Functional mobility training;Patient/family education;Stair training    PT Goals (Current goals can be found in the Care Plan section)  Acute Rehab PT Goals Patient Stated Goal: none stated PT Goal Formulation: With patient Time For Goal Achievement: 08/23/19 Potential to Achieve Goals: Good    Frequency Min 2X/week   Barriers to discharge Decreased caregiver support      Co-evaluation               AM-PAC PT "6 Clicks" Mobility  Outcome Measure Help needed turning from your back to your side while in a flat bed without using bedrails?: None Help needed moving from lying on your back to sitting on the side of a flat bed without using bedrails?: None Help needed moving to and from a bed to a chair (including a wheelchair)?: A Little Help needed standing up from a chair using your arms (e.g., wheelchair or bedside chair)?: A Little Help needed to walk in hospital room?: A Little Help needed climbing 3-5 steps with a railing? : A Little 6 Click Score: 20    End of Session Equipment Utilized During Treatment: Gait belt;Other (comment)(walking boot) Activity Tolerance: Patient tolerated treatment well Patient left: in bed;with call bell/phone within reach Nurse Communication: Mobility status PT Visit Diagnosis: Unsteadiness on feet (R26.81);Muscle weakness (generalized) (M62.81);Difficulty in walking, not elsewhere classified (R26.2);Repeated falls (R29.6)    Time: 1400-1415 PT Time Calculation (min) (ACUTE ONLY): 15 min   Charges:   PT Evaluation $PT Eval Moderate Complexity: 1 Mod          [name], PT, GCS 08/19/19,2:33 PM

## 2019-08-19 NOTE — Plan of Care (Signed)
Progressing towards goals

## 2019-08-19 NOTE — Consult Note (Signed)
Neurology Consultation  Reason for Consult: Right-sided weakness Referring Physician: Dr. Lorin Mercy  CC: Frequent falls for the past 2 days, right-sided weakness.  History is obtained from: Patient, chart review  HPI: Jaclyn Guzman is a 78 y.o. female past medical history of diabetes, hypertension, hyperlipidemia, seasonal allergies lives by herself and started noticing multiple falls somewhere from Thursday morning.  She says that her last known normal was sometime Wednesday night of Thursday morning when she woke up at 10 she had multiple falls and felt that her right leg was weak. She had a few falls without any injury but at one point became very concerned due to the increasing frequency of falls and came to the emergency room at [facility].  There she was examined on the camera by telemedicine neurology who noted some right arm and leg drift but no other signs.  There was concern for this being a small stroke and further imaging was recommended. Due to imaging not available at that ER, patient was admitted to the hospital for stroke/TIA work-up and neurological consultation was placed. Patient reports that her weakness is now better and nearly back to baseline.  She said the weakness lasted about 24 hours. Incidentally she also has a fractured right toe, which the primary team is working on.  No prior history of stroke.  No bowel or bladder issues.  No history of falls prior to this particular episode that started Thursday. Lives by herself.  Did not report any speech issues but has not really spoken to many people since Wednesday.  Needs family regularly on Wednesday for lunches.  Was totally normal on Wednesday.  Denies any headaches.  Denies visual changes.  Denies chest pain, cough, nausea vomiting, abdominal pain, bleeding or bruising tendencies.  LKW: 9 AM on Thursday, 08/17/2019 tpa given?: no, outside the window Premorbid modified Rankin scale (mRS): 0   ROS: ROS was  performed and is negative except as noted in the HPI.   Past Medical History:  Diagnosis Date  . Diabetes mellitus   HTN HLD Seasonal allergies  Family History  Problem Relation Age of Onset  . Breast cancer Neg Hx    Social History:   reports that she has never smoked. She has never used smokeless tobacco. She reports that she does not drink alcohol or use drugs.  Medications No current facility-administered medications for this encounter.  Takes medications for diabetes, hypertension and hyperlipidemia.  Formal reconciliation pending. Takes aspirin 81 every day. Takes atorvastatin Also is on ramipril.  Exam: Current vital signs: BP 125/62 (BP Location: Right Arm)   Pulse 72   Temp 98.4 F (36.9 C) (Oral)   Resp 18   Ht 5\' 3"  (1.6 m)   Wt 61.2 kg   SpO2 100%   BMI 23.91 kg/m  Vital signs in last 24 hours: Temp:  [98.1 F (36.7 C)-98.6 F (37 C)] 98.4 F (36.9 C) (10/10 1013) Pulse Rate:  [62-80] 72 (10/10 1013) Resp:  [14-20] 18 (10/10 1013) BP: (113-142)/(48-70) 125/62 (10/10 1013) SpO2:  [97 %-100 %] 100 % (10/10 1013) Weight:  [61.2 kg] 61.2 kg (10/09 1549) GENERAL: Awake, alert in NAD HEENT: - Normocephalic and atraumatic, dry mm, no LN++, no Thyromegally LUNGS - Clear to auscultation bilaterally with no wheezes CV - S1S2 RRR, no m/r/g, equal pulses bilaterally. ABDOMEN - Soft, nontender, nondistended with normoactive BS Ext: warm, well perfused  NEURO:  Mental Status: AA&Ox3  Language: speech is not dysarthric.  Naming,  repetition, fluency, and comprehension intact. Cranial Nerves: PERRL. EOM mildly disconjugate but no diplopia, visual fields full, no facial asymmetry, facial sensation intact, hearing intact, tongue/uvula/soft palate midline, normal sternocleidomastoid and trapezius muscle strength. No evidence of tongue atrophy or fibrillations Motor: Antigravity in all fours without vertical drift. Tone: is normal and bulk is normal Sensation- Intact  to light touch bilaterally with no extinction Coordination: FTN intact bilaterally, no ataxia in BLE. Gait- deferred  NIHSS-0   Labs I have reviewed labs in epic and the results pertinent to this consultation are:  CBC    Component Value Date/Time   WBC 6.5 08/18/2019 1715   RBC 4.11 08/18/2019 1715   HGB 13.1 08/18/2019 1715   HCT 39.4 08/18/2019 1715   PLT 226 08/18/2019 1715   MCV 95.9 08/18/2019 1715   MCH 31.9 08/18/2019 1715   MCHC 33.2 08/18/2019 1715   RDW 13.2 08/18/2019 1715   LYMPHSABS 2.0 08/18/2019 1715   MONOABS 0.5 08/18/2019 1715   EOSABS 0.1 08/18/2019 1715   BASOSABS 0.0 08/18/2019 1715    CMP     Component Value Date/Time   NA 136 08/18/2019 1715   K 4.0 08/18/2019 1715   CL 101 08/18/2019 1715   CO2 24 08/18/2019 1715   GLUCOSE 103 (H) 08/18/2019 1715   BUN 19 08/18/2019 1715   CREATININE 0.84 08/18/2019 1715   CALCIUM 9.6 08/18/2019 1715   PROT 6.7 08/18/2019 1715   ALBUMIN 4.2 08/18/2019 1715   AST 18 08/18/2019 1715   ALT 18 08/18/2019 1715   ALKPHOS 41 08/18/2019 1715   BILITOT 0.7 08/18/2019 1715   GFRNONAA >60 08/18/2019 1715   GFRAA >60 08/18/2019 1715   Imaging I have reviewed the images obtained: CT-scan of the brain-no acute changes  Assessment:  78 year old with diabetes, hypertension, hyperlipidemia presenting for increasing frequency of falls likely secondary to right-sided weakness that was transient lasted about 24 hours. Reports being back to baseline but has not walk because she also fractured her right. Concern for stroke/TIA like symptoms due to risk factors and unilateral weakness. Admitted for work-up  Impression: Admit for TIA/stroke work-up  Recommendations: Telemetry Frequent neurochecks MRI brain without contrast CTA head and neck A1c Lipid panel Aspirin 325 Atorvastatin 80 PT OT speech therapy Stroke swallow screen-n.p.o. until cleared. Permissive hypertension-allow for permissive hypertension and  treat only if systolic blood pressures are over 220 on a as needed basis.  Stroke team will follow with you.  -- Milon Dikes, MD [facility] Pager: [phone] If 7pm to 7am, please call on call as listed on [facility].

## 2019-08-19 NOTE — ED Notes (Signed)
Report given to [facility]. 

## 2019-08-20 DIAGNOSIS — G459 Transient cerebral ischemic attack, unspecified: Secondary | ICD-10-CM | POA: Diagnosis not present

## 2019-08-20 DIAGNOSIS — S92514A Nondisplaced fracture of proximal phalanx of right lesser toe(s), initial encounter for closed fracture: Secondary | ICD-10-CM | POA: Diagnosis not present

## 2019-08-20 DIAGNOSIS — R531 Weakness: Secondary | ICD-10-CM | POA: Diagnosis not present

## 2019-08-20 LAB — LIPID PANEL
Cholesterol: 145 mg/dL (ref 0–200)
HDL: 32 mg/dL — ABNORMAL LOW (ref >40)
LDL Cholesterol: 86 mg/dL (ref 0–99)
Total CHOL/HDL Ratio: 4.5 RATIO
Triglycerides: 133 mg/dL (ref <150)
VLDL: 27 mg/dL (ref 0–40)

## 2019-08-20 LAB — GLUCOSE, CAPILLARY
Glucose-Capillary: 129 mg/dL — ABNORMAL HIGH (ref 70–99)
Glucose-Capillary: 236 mg/dL — ABNORMAL HIGH (ref 70–99)

## 2019-08-20 MED ORDER — ASPIRIN 81 MG PO TABS: 81.0000 mg | ORAL_TABLET | Freq: Every day | ORAL | Status: AC

## 2019-08-20 MED ORDER — ATORVASTATIN CALCIUM 40 MG PO TABS: 40.0000 mg | ORAL_TABLET | Freq: Every day | ORAL | 0 refills | Status: DC

## 2019-08-20 MED ORDER — CLOPIDOGREL BISULFATE 75 MG PO TABS: 75.0000 mg | ORAL_TABLET | Freq: Every day | ORAL | 2 refills | Status: AC

## 2019-08-20 NOTE — Plan of Care (Signed)
Progressing toward goals. 

## 2019-08-20 NOTE — Progress Notes (Signed)
Orthopedic Tech Progress Note Patient Details:  Jaclyn Guzman Dec 14, 1940 [id number]  Ortho Devices Type of Ortho Device: Postop shoe/boot Ortho Device/Splint Location: LRE Ortho Device/Splint Interventions: Adjustment, Application, Ordered   Post Interventions Patient Tolerated: Well Instructions Provided: Care of device, Adjustment of device   [name] 08/20/2019, 9:26 AM

## 2019-08-20 NOTE — Progress Notes (Signed)
Orthopedic Tech Progress Note Patient Details:  Jaclyn Guzman [birth year] [id number] RN called requesting another shoe. Patient had an accident a soiled the shoe. Ortho Devices Type of Ortho Device: Postop shoe/boot Ortho Device/Splint Location: LRE Ortho Device/Splint Interventions: Adjustment, Application, Ordered   Post Interventions Patient Tolerated: Well Instructions Provided: Care of device, Adjustment of device   [name] 08/20/2019, 1:30 PM

## 2019-08-20 NOTE — Progress Notes (Signed)
Physical Therapy Treatment Patient Details Name: Jaclyn Guzman MRN: [id number] DOB: 1941-05-23 Today's Date: 08/20/2019    History of Present Illness Patient is a 78 year old female admitted with reports of frequent falls and right sided weakness. Patient found to have right foot toe fracture. CT of head showing no acute changes. MRI pending. PMH includes: HTN, DM, HLD    PT Comments    Pt progressing well towards physical therapy goals, ambulating in room with walker at a supervision level. Pt refusing to wear CAM boot, stating, "it's too heavy." Post op shoe ordered for ambulation after consultation with Dr. Benjamine Mola. Pt denying right foot pain with weightbearing. Pt easily distractible with decreased attention span and flat affect throughout assessment. HHPT remains appropriate at discharge.     Follow Up Recommendations  Home health PT     Equipment Recommendations  Rolling walker with 5" wheels    Recommendations for Other Services       Precautions / Restrictions Precautions Precautions: Fall Required Braces or Orthoses: Other Brace Other Brace: post op shoe ordered Restrictions Weight Bearing Restrictions: No    Mobility  Bed Mobility Overal bed mobility: Modified Independent                Transfers Overall transfer level: Needs assistance Equipment used: Rolling walker (2 wheeled) Transfers: Sit to/from [employer] Transfers Sit to Stand: Supervision            Ambulation/Gait Ambulation/Gait assistance: Supervision Gait Distance (Feet): 25 Feet Assistive device: Rolling walker (2 wheeled) Gait Pattern/deviations: Trunk flexed;Step-through pattern;Decreased weight shift to right     General Gait Details: Pt requesting to go to bathroom and refusing to wear CAM boot due to weight. Ambulating with supervision, cues for walker proximity and sequencing. Walker height adjusted. Pt denies right foot pain with weightbearing. Further gait deferred;  will order post op shoe for ambulation.   Stairs             Wheelchair Mobility    Modified Rankin (Stroke Patients Only) Modified Rankin (Stroke Patients Only) Pre-Morbid Rankin Score: No symptoms Modified Rankin: Moderately severe disability     Balance Overall balance assessment: Needs assistance Sitting-balance support: Feet supported Sitting balance-Leahy Scale: Good     Standing balance support: Bilateral upper extremity supported Standing balance-Leahy Scale: Fair                              Cognition Arousal/Alertness: Awake/alert Behavior During Therapy: Flat affect Overall Cognitive Status: No family/caregiver present to determine baseline cognitive functioning Area of Impairment: Attention;Safety/judgement                   Current Attention Level: Selective     Safety/Judgement: Decreased awareness of safety;Decreased awareness of deficits     General Comments: Pt less tangential in thought today, however, distractable during the assessment by the television      Exercises      General Comments        Pertinent Vitals/Pain Pain Assessment: No/denies pain    Home Living                      Prior Function            PT Goals (current goals can now be found in the care plan section) Acute Rehab PT Goals Patient Stated Goal: none stated Potential to Achieve Goals: Good Progress towards PT  goals: Progressing toward goals    Frequency    Min 3X/week      PT Plan Frequency needs to be updated    Co-evaluation              AM-PAC PT "6 Clicks" Mobility   Outcome Measure  Help needed turning from your back to your side while in a flat bed without using bedrails?: None Help needed moving from lying on your back to sitting on the side of a flat bed without using bedrails?: None Help needed moving to and from a bed to a chair (including a wheelchair)?: None Help needed standing up from a chair  using your arms (e.g., wheelchair or bedside chair)?: None Help needed to walk in hospital room?: None Help needed climbing 3-5 steps with a railing? : A Little 6 Click Score: 23    End of Session   Activity Tolerance: Patient tolerated treatment well Patient left: in chair;with call bell/phone within reach Nurse Communication: Mobility status PT Visit Diagnosis: Unsteadiness on feet (R26.81);Muscle weakness (generalized) (M62.81);Difficulty in walking, not elsewhere classified (R26.2);Repeated falls (R29.6)     Time: 0822-0859 PT Time Calculation (min) (ACUTE ONLY): 37 min  Charges:  $Gait Training: 8-22 mins $Therapeutic Activity: 8-22 mins                     Ellamae Sia, PT, DPT Acute Rehabilitation Services Pager [phone] Office [phone]    Willy Eddy 08/20/2019, 9:10 AM

## 2019-08-20 NOTE — Discharge Instructions (Signed)
Post op shoe for ambulation

## 2019-08-20 NOTE — Discharge Summary (Signed)
Physician Discharge Summary  Jaclyn Guzman JYN:[id number] DOB: September 04, 1941 DOA: 08/18/2019  PCP: Georgann Housekeeper, MD  Admit date: 08/18/2019 Discharge date: 08/20/2019  Admitted From: home Discharge disposition: home   Recommendations for Outpatient Follow-Up:   1. Home health   Discharge Diagnosis:   Principal Problem:   TIA (transient ischemic attack) Active Problems:   Urinary incontinence   Hypertension   Dyslipidemia   Nondisplaced fracture of proximal phalanx of right lesser toe(s), initial encounter for closed fracture    Discharge Condition: Improved.  Diet recommendation: Low sodium, heart healthy.  Carbohydrate-modified  Wound care: None.  Code status: Full.   History of Present Illness:   Jaclyn Guzman is a 78 y.o. female with medical history significant of DM and HTN presenting with a fall.    She fell on Friday.  She lost her balance.  She denies feeling light-headed or dizzy.  She injured her little toe.  No n/w/t of arms or legs.  No headache.  No visual disturbance.  No dysphagia or dysarthria.  After she fell, she noticed difficulty walking.  She denies lightheadedness or ataxia after falling and thinks her ambulatory difficulty may have been due to her broken toe.  She feels fine now.    Hospital Course by Problem:   Probable TIA: Severe left PCA P3 segment stenosis  Resultant  No deficits  CT head - No acute intracranial abnormality.   MRI head - Advanced atrophy and small vessel disease. No acute intracranial findings.   CTA H&N - Severe left PCA P3 segment stenosis. Moderate cerebral atrophy and severe chronic small vessel ischemic disease without evidence of acute intracranial abnormality.  2D Echo - EF 60 - 65%. No cardiac source of emboli identified.   Sars Corona Virus 2 - negative  LDL - 86  HgbA1c - 6.2  aspirin 81 mg daily prior to admission, now on aspirin 81 mg and Plavix 75 mg  Daily x 3 weeks and then Plavix  alone   Hypertension  Resume home meds   Hyperlipidemia  Increase home statin  LDL 86, goal < 70    Diabetes  Home diabetic meds: Metformin  HgbA1c 6.2, goal < 7.0     Medical Consultants:      Discharge Exam:   Vitals:   08/20/19 0301 08/20/19 0734  BP: (!) 119/57 (!) 122/58  Pulse: 67 74  Resp: 16 19  Temp: 97.8 F (36.6 C) 98.4 F (36.9 C)  SpO2: 95% 98%   Vitals:   08/19/19 1954 08/19/19 2251 08/20/19 0301 08/20/19 0734  BP: 125/64 (!) 108/57 (!) 119/57 (!) 122/58  Pulse: 85 69 67 74  Resp: Temp: 97.9 F (36.6 C) 98.1 F (36.7 C) 97.8 F (36.6 C) 98.4 F (36.9 C)  TempSrc: Oral Oral Oral Oral  SpO2: 98% 100% 95% 98%  Weight:      Height:        General exam: Appears calm and comfortable.  The results of significant diagnostics from this hospitalization (including imaging, microbiology, ancillary and laboratory) are listed below for reference.     Procedures and Diagnostic Studies:   Ct Angio Head W Or Wo Contrast  Result Date: 08/19/2019 CLINICAL DATA:  TIA. Frequent falls and right-sided weakness. EXAM: CT ANGIOGRAPHY HEAD AND NECK TECHNIQUE: Multidetector CT imaging of the head and neck was performed using the standard protocol during bolus administration of intravenous contrast. Multiplanar CT image reconstructions and MIPs were  obtained to evaluate the vascular anatomy. Carotid stenosis measurements (when applicable) are obtained utilizing NASCET criteria, using the distal internal carotid diameter as the denominator. CONTRAST:  OMNIPAQUE IOHEXOL 350 MG/ML SOLN COMPARISON:  Head CT 08/18/2019. No prior angiographic imaging. FINDINGS: CT HEAD FINDINGS Brain: There is no evidence of acute infarct, intracranial hemorrhage, mass, midline shift, or extra-axial fluid collection. Moderate cerebral atrophy is again noted. Confluent hypodensities in the cerebral white matter bilaterally are unchanged and nonspecific but compatible  with severe chronic small vessel ischemic disease. Vascular: Calcified atherosclerosis at the skull base. No hyperdense vessel. Skull: No fracture or focal osseous lesion. Sinuses: Paranasal sinuses and mastoid air cells are clear. Orbits: Left cataract extraction. Review of the MIP images confirms the above findings CTA NECK FINDINGS Aortic arch: Standard 3 vessel aortic arch with minimal atherosclerotic plaque. Widely patent arch vessel origins. Right carotid system: Patent with mild calcified plaque at the carotid bifurcation. No evidence of significant stenosis or dissection. Left carotid system: Patent with mild calcified plaque at the carotid bifurcation. No evidence of significant stenosis or dissection. Vertebral arteries: Patent without evidence of significant stenosis or dissection. Mildly dominant right vertebral artery. Skeleton: Moderate cervical disc degeneration. Other neck: No evidence of cervical lymphadenopathy or mass. Upper chest: Clear lung apices. Review of the MIP images confirms the above findings CTA HEAD FINDINGS Anterior circulation: The internal carotid arteries are patent from skull base to carotid termini with minimal nonstenotic plaque bilaterally. ACAs and MCAs are patent without evidence of proximal branch occlusion or significant proximal stenosis. No aneurysm is identified. Posterior circulation: The intracranial vertebral arteries are widely patent to the basilar. Patent PICAs and SCAs are seen bilaterally. The basilar artery is widely patent. There are large posterior communicating arteries bilaterally with left greater than right P1 segment hypoplasia. No significant proximal PCA stenosis is seen, however there is a severe proximal left P3 stenosis involving the inferior division. No aneurysm is identified. Venous sinuses: As permitted by contrast timing, patent. Anatomic variants: Predominantly fetal type origin of the PCAs. Review of the MIP images confirms the above findings  IMPRESSION: 1. No large vessel occlusion or flow limiting proximal stenosis. 2. Severe left PCA P3 segment stenosis. 3. Mild cervical carotid artery atherosclerosis without stenosis. 4. Moderate cerebral atrophy and severe chronic small vessel ischemic disease without evidence of acute intracranial abnormality. 5.  Aortic Atherosclerosis (ICD10-I70.0). Electronically Signed   By: Sebastian Ache M.D.   On: 08/19/2019 13:57   Ct Head Wo Contrast  Result Date: 08/18/2019 CLINICAL DATA:  Stroke suspected. Focal neurologic deficit of longer than 6 hours. EXAM: CT HEAD WITHOUT CONTRAST TECHNIQUE: Contiguous axial images were obtained from the base of the skull through the vertex without intravenous contrast. COMPARISON:  July 26, 2013 FINDINGS: Brain: No evidence of acute infarction, hemorrhage, hydrocephalus, extra-axial collection or mass lesion/mass effect. Moderate to advanced brain parenchymal volume loss. Advanced deep white matter microangiopathy Vascular: Calcific atherosclerotic disease. Skull: Normal. Negative for fracture or focal lesion. Sinuses/Orbits: No acute finding. Other: None. IMPRESSION: 1. No acute intracranial abnormality. 2. 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 Electronically Signed   By: Ted Mcalpine M.D.   On: 08/18/2019 17:47   Ct Angio Neck W Or Wo Contrast  Result Date: 08/19/2019 CLINICAL DATA:  TIA. Frequent falls and right-sided weakness. EXAM: CT ANGIOGRAPHY HEAD AND NECK TECHNIQUE: Multidetector CT imaging of the head and neck was performed using the standard protocol during bolus administration of intravenous contrast.  Multiplanar CT image reconstructions and MIPs were obtained to evaluate the vascular anatomy. Carotid stenosis measurements (when applicable) are obtained utilizing NASCET criteria, using the distal internal carotid diameter as the denominator. CONTRAST:  100mL OMNIPAQUE IOHEXOL 350 MG/ML SOLN COMPARISON:  Head  CT 08/18/2019. No prior angiographic imaging. FINDINGS: CT HEAD FINDINGS Brain: There is no evidence of acute infarct, intracranial hemorrhage, mass, midline shift, or extra-axial fluid collection. Moderate cerebral atrophy is again noted. Confluent hypodensities in the cerebral white matter bilaterally are unchanged and nonspecific but compatible with severe chronic small vessel ischemic disease. Vascular: Calcified atherosclerosis at the skull base. No hyperdense vessel. Skull: No fracture or focal osseous lesion. Sinuses: Paranasal sinuses and mastoid air cells are clear. Orbits: Left cataract extraction. Review of the MIP images confirms the above findings CTA NECK FINDINGS Aortic arch: Standard 3 vessel aortic arch with minimal atherosclerotic plaque. Widely patent arch vessel origins. Right carotid system: Patent with mild calcified plaque at the carotid bifurcation. No evidence of significant stenosis or dissection. Left carotid system: Patent with mild calcified plaque at the carotid bifurcation. No evidence of significant stenosis or dissection. Vertebral arteries: Patent without evidence of significant stenosis or dissection. Mildly dominant right vertebral artery. Skeleton: Moderate cervical disc degeneration. Other neck: No evidence of cervical lymphadenopathy or mass. Upper chest: Clear lung apices. Review of the MIP images confirms the above findings CTA HEAD FINDINGS Anterior circulation: The internal carotid arteries are patent from skull base to carotid termini with minimal nonstenotic plaque bilaterally. ACAs and MCAs are patent without evidence of proximal branch occlusion or significant proximal stenosis. No aneurysm is identified. Posterior circulation: The intracranial vertebral arteries are widely patent to the basilar. Patent PICAs and SCAs are seen bilaterally. The basilar artery is widely patent. There are large posterior communicating arteries bilaterally with left greater than right P1  segment hypoplasia. No significant proximal PCA stenosis is seen, however there is a severe proximal left P3 stenosis involving the inferior division. No aneurysm is identified. Venous sinuses: As permitted by contrast timing, patent. Anatomic variants: Predominantly fetal type origin of the PCAs. Review of the MIP images confirms the above findings IMPRESSION: 1. No large vessel occlusion or flow limiting proximal stenosis. 2. Severe left PCA P3 segment stenosis. 3. Mild cervical carotid artery atherosclerosis without stenosis. 4. Moderate cerebral atrophy and severe chronic small vessel ischemic disease without evidence of acute intracranial abnormality. 5.  Aortic Atherosclerosis (ICD10-I70.0). Electronically Signed   By: Sebastian AcheAllen  Grady M.D.   On: 08/19/2019 13:57   Mr Brain Wo Contrast  Result Date: 08/19/2019 CLINICAL DATA:  TIA, initial exam. Frequent falls for the past 2 days, episodic RIGHT-sided weakness now resolved. Stroke risk factors include diabetes, hypertension, and hyperlipidemia. EXAM: MRI HEAD WITHOUT CONTRAST TECHNIQUE: Multiplanar, multiecho pulse sequences of the brain and surrounding structures were obtained without intravenous contrast. COMPARISON:  CT head 08/18/2019. CTA head neck earlier today. FINDINGS: Brain: No acute stroke, acute hemorrhage, mass lesion, or extra-axial fluid. Advanced atrophy, central predominant, hydrocephalus ex vacuo. Extensive confluent subcortical and periventricular white matter signal abnormality extending into the brainstem, likely chronic microvascular ischemic change. Vascular: Normal flow voids. Skull and upper cervical spine: Normal marrow signal. Sinuses/Orbits: No acute findings. LEFT cataract extraction. Other: No mastoid fluid. IMPRESSION: Advanced atrophy and small vessel disease. No acute intracranial findings. Electronically Signed   By: Elsie StainJohn T Curnes M.D.   On: 08/19/2019 18:01   Dg Foot Complete Right  Result Date: 08/18/2019 CLINICAL DATA:   Fifth toe pain  after fall. EXAM: RIGHT FOOT COMPLETE - 3+ VIEW COMPARISON:  None. FINDINGS: Suspected acute nondisplaced intra-articular fracture through the medial base of the fifth proximal phalanx. No additional fracture. No dislocation. Joint spaces are preserved. Osteopenia. Soft tissue swelling around the fifth MTP joint. IMPRESSION: 1. Suspected acute nondisplaced intra-articular fracture through the medial base of the fifth proximal phalanx. Electronically Signed   By: Titus Dubin M.D.   On: 08/18/2019 17:49     Labs:   Basic Metabolic Panel: Recent Labs  Lab 08/18/19 1715  NA 136  K 4.0  CL 101  CO2 24  GLUCOSE 103*  BUN 19  CREATININE 0.84  CALCIUM 9.6   GFR Estimated Creatinine Clearance: 47.1 mL/min (by C-G formula based on SCr of 0.84 mg/dL). Liver Function Tests: Recent Labs  Lab 08/18/19 1715  AST 18  ALT 18  ALKPHOS 41  BILITOT 0.7  PROT 6.7  ALBUMIN 4.2   No results for input(s): LIPASE, AMYLASE in the last 168 hours. No results for input(s): AMMONIA in the last 168 hours. Coagulation profile Recent Labs  Lab 08/18/19 1715  INR 1.0    CBC: Recent Labs  Lab 08/18/19 1715  WBC 6.5  NEUTROABS 3.8  HGB 13.1  HCT 39.4  MCV 95.9  PLT 226   Cardiac Enzymes: No results for input(s): CKTOTAL, CKMB, CKMBINDEX, TROPONINI in the last 168 hours. BNP: Invalid input(s): POCBNP CBG: Recent Labs  Lab 08/19/19 0745 08/19/19 1609 08/19/19 2111 08/20/19 0608  GLUCAP 125* 223* 144* 129*   D-Dimer No results for input(s): DDIMER in the last 72 hours. Hgb A1c Recent Labs    08/19/19 1212  HGBA1C 6.2*   Lipid Profile Recent Labs    08/20/19 0420  CHOL 145  HDL 32*  LDLCALC 86  TRIG 133  CHOLHDL 4.5   Thyroid function studies Recent Labs    08/19/19 1212  TSH 1.010   Anemia work up No results for input(s): VITAMINB12, FOLATE, FERRITIN, TIBC, IRON, RETICCTPCT in the last 72 hours. Microbiology Recent Results (from the past 240  hour(s))  SARS CORONAVIRUS 2 (TAT 6-24 HRS) Nasopharyngeal     Status: None   Collection Time: 08/18/19  5:15 PM   Specimen: Nasopharyngeal  Result Value Ref Range Status   SARS Coronavirus 2 NEGATIVE NEGATIVE Final    Comment: (NOTE) SARS-CoV-2 target nucleic acids are NOT DETECTED. The SARS-CoV-2 RNA is generally detectable in upper and lower respiratory specimens during the acute phase of infection. Negative results do not preclude SARS-CoV-2 infection, do not rule out co-infections with other pathogens, and should not be used as the sole basis for treatment or other patient management decisions. Negative results must be combined with clinical observations, patient history, and epidemiological information. The expected result is Negative. Fact Sheet for Patients: SugarRoll.be Fact Sheet for Healthcare Providers: https://www.woods-mathews.com/ This test is not yet approved or cleared by the [place] FDA and  has been authorized for detection and/or diagnosis of SARS-CoV-2 by FDA under an Emergency Use Authorization (EUA). This EUA will remain  in effect (meaning this test can be used) for the duration of the COVID-19 declaration under Section 56 4(b)(1) of the Act, 21 U.S.C. section 360bbb-3(b)(1), unless the authorization is terminated or revoked sooner. Performed at [facility], [place] [address] [place], [place] [address]      Discharge Instructions:   Discharge Instructions    Ambulatory referral to Neurology   Complete by: As directed    An appointment is  requested in approximately:6 weeks   Diet - low sodium heart healthy   Complete by: As directed    Diet Carb Modified   Complete by: As directed    Discharge instructions   Complete by: As directed    Asa plus plavix for 3 week then plavix alone   Increase activity slowly   Complete by: As directed      Allergies as of 08/20/2019      Reactions    Amoxicillin-pot Clavulanate Hives   Meloxicam    Scallops [shellfish Allergy] Other (See Comments)   "I don't feel so good after an hour"   Toviaz [fesoterodine Fumarate Er]       Medication List    TAKE these medications   alendronate 70 MG tablet Commonly known as: FOSAMAX Take 70 mg by mouth once a week. Sundays   aspirin 81 MG tablet Take 1 tablet (81 mg total) by mouth daily for 21 days.   atorvastatin 40 MG tablet Commonly known as: LIPITOR Take 1 tablet (40 mg total) by mouth daily. Start taking on: August 21, 2019 What changed:   medication strength  how much to take  when to take this   CALTRATE 600+D PO Take 1 tablet by mouth 2 (two) times daily.   CENTRUM SILVER PO Take 1 tablet by mouth daily.   clopidogrel 75 MG tablet Commonly known as: Plavix Take 1 tablet (75 mg total) by mouth daily.   metFORMIN 500 MG tablet Commonly known as: GLUCOPHAGE Take 500-1,000 mg by mouth See admin instructions. 2 tablets with breakfast and 1 with dinner   ramipril 2.5 MG capsule Commonly known as: ALTACE Take 2.5 mg by mouth daily.   solifenacin 5 MG tablet Commonly known as: VESICARE Take 5 mg by mouth daily.   VITAMIN D PO Take 1 tablet by mouth daily.      Follow-up Information    Georgann Housekeeper, MD Follow up in 1 week(s).   Specialty: Internal Medicine Contact information: [address] [place] [address] [phone]            Time coordinating discharge: 25 min  Signed:  Joseph Art DO  [facility] 08/20/2019, 11:18 AM

## 2019-08-20 NOTE — TOC Initial Note (Addendum)
Transition of Care [facility]) - Initial/Assessment Note    Patient Details  Name: Jaclyn Guzman MRN: [id number] Date of Birth: 05/06/1941  [facility] of [facility]) CM/SW Contact:    Sharin Mons, RN Phone Number: 08/20/2019, 12:08 PM  Clinical Narrative:  Presented with a fall. Hx of DM and HTN. NCM spoke with pt @ bedside regarding TOC. Pt states she lives alone. PTA independent with ADL's, no DME usage. NCM shared PT/ OT's evaluation/ recommendations:  Home health PT/  Home health OT. Pt agreeable to home health services. Copy of  Medicare star ratings for home health agencies given to pt.... NCM to f/u with selection.   Referral made with [employer] for DME: rolling walker and 3in1/BSC. Equipment will be delivered to bedside prior to discharge. Pt states has transportation to home.  08/20/2019 @ 1330  Pt selected  [facility] for home health services, referral made and accept.  Expected Discharge Plan: [place] Barriers to Discharge: No Barriers Identified   Patient Goals and CMS Choice Patient states their goals for this hospitalization and ongoing recovery are:: to go home CMS Medicare.gov Compare Post Acute Care list provided to:: Patient Choice offered to / list presented to : Patient  Expected Discharge Plan and Services Expected Discharge Plan: [place]         Expected Discharge Date: 08/20/19               DME Arranged: Jeryl [place] DME Agency: [employer] Date DME Agency Contacted: 08/20/19 Time DME Agency Contacted: 1204 Representative spoke with at DME Agency: [place]: PT          Prior Living Arrangements/Services     Patient language and need for interpreter reviewed:: Yes Do you feel safe going back to the place where you live?: Yes      Need for Family Participation in Patient Care: No (Comment) Care giver support system in place?: No (comment)   Criminal Activity/Legal  Involvement Pertinent to Current Situation/Hospitalization: No - Comment as needed  Activities of Daily Living      Permission Sought/Granted                  Emotional Assessment Appearance:: Appears stated age Attitude/Demeanor/Rapport: Engaged Affect (typically observed): Accepting Orientation: : Oriented to Self, Oriented to Place, Oriented to  Time, Oriented to Situation Alcohol / Substance Use: Not Applicable Psych Involvement: No (comment)  Admission diagnosis:  TIA (transient ischemic attack) [G45.9] Fracture of unspecified metatarsal bone(s), right foot, initial encounter for closed fracture [S92.301A] Patient Active Problem List   Diagnosis Date Noted  . Nondisplaced fracture of proximal phalanx of right lesser toe(s), initial encounter for closed fracture 08/19/2019  . Urinary incontinence   . Hypertension   . Dyslipidemia   . TIA (transient ischemic attack) 08/18/2019   PCP:  Wenda Low, MD Pharmacy:   [facility] [place] [address] Phone: [phone] Fax: [phone]     Social Determinants of Health (SDOH) Interventions    Readmission Risk Interventions No flowsheet data found.

## 2019-08-20 NOTE — Plan of Care (Signed)
Plan of care adequate for discharge.

## 2019-08-20 NOTE — Progress Notes (Signed)
Occupational Therapy Treatment Patient Details Name: Jaclyn Guzman MRN: [id number] DOB: December 25, 1940 Today's Date: 08/20/2019    History of present illness Patient is a 78 year old female admitted with reports of frequent falls and right sided weakness. Patient found to have right foot toe fracture. CT of head showing no acute changes. MRI pending. PMH includes: HTN, DM, HLD   OT comments  Pt. Seen for skilled OT session post op shoe donned. Pt. Prefers it to the boot.  Able to perform grooming, LB dressing tasks, and toileting task mostly min guard A, cues for hand placement during sit/stand.  Eager for d/c home when able.  Follow Up Recommendations  Home health OT;Supervision - Intermittent    Equipment Recommendations  3 in 1 bedside commode    Recommendations for Other Services      Precautions / Restrictions Precautions Precautions: Fall Required Braces or Orthoses: Other Brace Other Brace: post op shoe ordered       Mobility Bed Mobility               General bed mobility comments: seated in chair at beg. and end of session  Transfers Overall transfer level: Needs assistance Equipment used: Rolling walker (2 wheeled) Transfers: Sit to/from [facility] Sit to Stand: Supervision Stand pivot transfers: Min guard            Balance                                           ADL either performed or assessed with clinical judgement   ADL Overall ADL's : Needs assistance/impaired     Grooming: Wash/dry hands;Min guard;Standing               Lower Body Dressing: Min guard;Sitting/lateral leans;Sit to/from stand Lower Body Dressing Details (indicate cue type and reason): don/doff socks, post op boot, underwear, feminine pad Toilet Transfer: Minimal assistance;Regular Toilet;RW;Grab bars Toilet Transfer Details (indicate cue type and reason): pt. with un controlle descend onto commode. cues for control. reports she uses  sink counter and window ceil in her house to hold onto during sit/stand and stand/sit Toileting- Water quality scientist and Hygiene: Min guard;Sit to/from stand;Sitting/lateral lean       Functional mobility during ADLs: Min guard;Rolling walker General ADL Comments: cues for safety and tech.  during transfers     Vision       Perception     Praxis      Cognition Arousal/Alertness: Awake/alert Behavior During Therapy: [facility] for tasks assessed/performed Overall Cognitive Status: No family/caregiver present to determine baseline cognitive functioning                                          Exercises     Shoulder Instructions       General Comments      Pertinent Vitals/ Pain       Pain Assessment: No/denies pain  Home Living     Available Help at Discharge: Family;Friend(s);Available PRN/intermittently Type of Home: House                              Lives With: Alone    Prior Functioning/Environment  Frequency           Progress Toward Goals  OT Goals(current goals can now be found in the care plan section)  Progress towards OT goals: Progressing toward goals     Plan      Co-evaluation                 AM-PAC OT "6 Clicks" Daily Activity     Outcome Measure   Help from another person eating meals?: None Help from another person taking care of personal grooming?: A Little Help from another person toileting, which includes using toliet, bedpan, or urinal?: A Little Help from another person bathing (including washing, rinsing, drying)?: A Little Help from another person to put on and taking off regular upper body clothing?: None Help from another person to put on and taking off regular lower body clothing?: A Little 6 Click Score: 20    End of Session Equipment Utilized During Treatment: Rolling walker  OT Visit Diagnosis: Unsteadiness on feet (R26.81);Other abnormalities of gait and mobility  (R26.89);Muscle weakness (generalized) (M62.81);Other symptoms and signs involving cognitive function   Activity Tolerance Patient tolerated treatment well   Patient Left in chair;with call bell/phone within reach   Nurse Communication Other (comment)(spoke with rn, new post op shoe arrived, pt. likes it but had episode of urinary incont. and shoe was compromised. need a new one. also request chair alarm be turned back on)        Time: 0940-7680 OT Time Calculation (min): 21 min  Charges: OT General Charges $OT Visit: 1 Visit OT Treatments $Self Care/Home Management : 8-22 mins   Robet Leu, COTA/L 08/20/2019, 1:24 PM

## 2019-08-20 NOTE — Evaluation (Signed)
Speech Language Pathology Evaluation Patient Details Name: Jaclyn Guzman MRN: [id number] DOB: June 03, 1941 Today's Date: 08/20/2019 Time: 1130-1150 SLP Time Calculation (min) (ACUTE ONLY): 20 min  Problem List:  Patient Active Problem List   Diagnosis Date Noted  . Nondisplaced fracture of proximal phalanx of right lesser toe(s), initial encounter for closed fracture 08/19/2019  . Urinary incontinence   . Hypertension   . Dyslipidemia   . TIA (transient ischemic attack) 08/18/2019   Past Medical History:  Past Medical History:  Diagnosis Date  . Diabetes mellitus   . Dyslipidemia   . Hypertension   . Osteoporosis   . Urinary incontinence    Past Surgical History: History reviewed. No pertinent surgical history. HPI:  Patient is a 78 year old female admitted with reports of frequent falls and right sided weakness. Patient found to have right foot toe fracture. CT of head and Brain MRI showing no acute changes. PMH includes: HTN, DM, HLD.   Assessment / Plan / Recommendation Clinical Impression  Pt is a 78 year old female who was seen for a cognitive-linguistic evaluation in the setting of a possible TIA.  Pt reported that she lives alone and is independent with all ADLs and IADLs at baseline.  She additionally stated that she has family members who live nearby.  Pt completed the [facility] Cognitive Assessment [facility]) and she presented with overall mild cognitive deficits in the areas of attention and short-term memory.  Pt scored overall 22/30 (normal >/= 26/30).  Pt exhibited the most difficulty with short-term recall.  Pt's accuracy improved from 0/5 to 5/5 given category cues during short-term recall task.  SLP educated pt regarding short-term memory compensatory strategies including writing things down, and pt reported that she has a planner where she records appointments, meetings, etc.  She also stated that she utilizes a pill organizer to manage her medications at home.  Pt may  benefit from home health ST targeting cognitive deficits; however, suspect that deficits may be baseline.  Recommend supervision and assistance with IADLs (medications, finances, etc.) when pt is first discharged home.  Pt verbalized understanding of all recommendations.  No further skilled ST is warranted at this level of care.  Please re-consult if additional needs arise.     SLP Assessment  SLP Recommendation/Assessment: All further Speech Lanaguage Pathology  needs can be addressed in the next venue of care SLP Visit Diagnosis: Cognitive communication deficit (R41.841)    Follow Up Recommendations  Home health SLP    Frequency and Duration           SLP Evaluation Cognition  Overall Cognitive Status: No family/caregiver present to determine baseline cognitive functioning Arousal/Alertness: Awake/alert Orientation Level: Oriented X4 Attention: Sustained Sustained Attention: Impaired Sustained Attention Impairment: Verbal complex Memory: Impaired Memory Impairment: Retrieval deficit;Decreased short term memory Decreased Short Term Memory: Verbal complex Awareness: Appears intact Problem Solving: Appears intact Executive Function: Reasoning Reasoning: Appears intact Safety/Judgment: Appears intact       Comprehension  Auditory Comprehension Overall Auditory Comprehension: Appears within functional limits for tasks assessed Reading Comprehension Reading Status: Within funtional limits    Expression Expression Primary Mode of Expression: Verbal Verbal Expression Overall Verbal Expression: Appears within functional limits for tasks assessed Written Expression Dominant Hand: Right Written Expression: Within Functional Limits   Oral / Motor  Motor Speech Intelligibility: Intelligible   Bretta Bang, M.S., [place] Office: [phone]             [place]  08/20/2019, 12:28 PM

## 2019-08-20 NOTE — Progress Notes (Signed)
STROKE TEAM PROGRESS NOTE   HISTORY OF PRESENT ILLNESS (per record) Jaclyn Guzman is a 78 y.o. female past medical history of diabetes, hypertension, hyperlipidemia, seasonal allergies lives by herself and started noticing multiple falls somewhere from Thursday morning.  She says that her last known normal was sometime Wednesday night of Thursday morning when she woke up at 10 she had multiple falls and felt that her right leg was weak. She had a few falls without any injury but at one point became very concerned due to the increasing frequency of falls and came to the emergency room [facility].  There she was examined on the camera by telemedicine neurology who noted some right arm and leg drift but no other signs.  There was concern for this being a small stroke and further imaging was recommended. Due to imaging not available at that ER, patient was admitted to the hospital for stroke/TIA work-up and neurological consultation was placed. Patient reports that her weakness is now better and nearly back to baseline.  She said the weakness lasted about 24 hours. Incidentally she also has a fractured right toe, which the primary team is working on.  No prior history of stroke.  No bowel or bladder issues.  No history of falls prior to this particular episode that started Thursday. Lives by herself.  Did not report any speech issues but has not really spoken to many people since Wednesday.  Needs family regularly on Wednesday for lunches.  Was totally normal on Wednesday.  Denies any headaches. Denies visual changes.  Denies chest pain, cough, nausea vomiting, abdominal pain, bleeding or bruising tendencies.  LKW: 9 AM on Thursday, 08/17/2019 tpa given?: no, outside the window Premorbid modified Rankin scale (mRS): 0   INTERVAL HISTORY I have personally reviewed history of presenting illness with the patient in details, electronic medical records as well as imaging films in PACS.   She states she hurt her toe and she fell few days ago and when evaluated by telemetry neurology she was noted as having right upper and lower extremity weakness which was transient and has recovered.  MRI scan shows no definite acute infarct.  CT angiogram showed no significant large vessel intracranial extracranial stenosis.  LDL cholesterol is elevated at 86 mg percent.  Hemoglobin A1c 6.2.  Urine drug screen is negative.  Echocardiogram is unremarkable.    OBJECTIVE Vitals:   08/19/19 1800 08/19/19 1954 08/19/19 2251 08/20/19 0301  BP: 120/72 125/64 (!) 108/57 (!) 119/57  Pulse: 86 85 69 67  Resp:  17 16 16   Temp:  97.9 F (36.6 C) 98.1 F (36.7 C) 97.8 F (36.6 C)  TempSrc:  Oral Oral Oral  SpO2: 99% 98% 100% 95%  Weight:      Height:        CBC:  Recent Labs  Lab 08/18/19 1715  WBC 6.5  NEUTROABS 3.8  HGB 13.1  HCT 39.4  MCV 95.9  PLT 226    Basic Metabolic Panel:  Recent Labs  Lab 08/18/19 1715  NA 136  K 4.0  CL 101  CO2 24  GLUCOSE 103*  BUN 19  CREATININE 0.84  CALCIUM 9.6    Lipid Panel:     Component Value Date/Time   CHOL 145 08/20/2019 0420   TRIG 133 08/20/2019 0420   HDL 32 (L) 08/20/2019 0420   CHOLHDL 4.5 08/20/2019 0420   VLDL 27 08/20/2019 0420   LDLCALC 86 08/20/2019 0420   HgbA1c:  Lab Results  Component Value Date   HGBA1C 6.2 (H) 08/19/2019   Urine Drug Screen:     Component Value Date/Time   LABOPIA NONE DETECTED 08/18/2019 1715   COCAINSCRNUR NONE DETECTED 08/18/2019 1715   LABBENZ NONE DETECTED 08/18/2019 1715   AMPHETMU NONE DETECTED 08/18/2019 1715   THCU NONE DETECTED 08/18/2019 1715   LABBARB NONE DETECTED 08/18/2019 1715    Alcohol Level     Component Value Date/Time   ETH <10 08/18/2019 1715    IMAGING  Ct Angio Head W Or Wo Contrast Ct Angio Neck W Or Wo Contrast 08/19/2019 IMPRESSION:  1. No large vessel occlusion or flow limiting proximal stenosis.  2. Severe left PCA P3 segment stenosis.  3. Mild  cervical carotid artery atherosclerosis without stenosis.  4. Moderate cerebral atrophy and severe chronic small vessel ischemic disease without evidence of acute intracranial abnormality.  5.  Aortic Atherosclerosis (ICD10-I70.0).   Ct Head Wo Contrast 08/18/2019 IMPRESSION:  1. No acute intracranial abnormality.  2. 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  Mr Brain Wo Contrast 08/19/2019 IMPRESSION:  Advanced atrophy and small vessel disease. No acute intracranial findings.   Dg Foot Complete Right 08/18/2019 IMPRESSION:  Suspected acute nondisplaced intra-articular fracture through the medial base of the fifth proximal phalanx.   Transthoracic Echocardiogram  08/19/2019 Impression 1. Left ventricular ejection fraction, by visual estimation, is 60 to 65%. The left ventricle has normal function. Normal left ventricular size. There is no left ventricular hypertrophy.  2. Left ventricular diastolic Doppler parameters are consistent with impaired relaxation pattern of LV diastolic filling.  3. Global right ventricle has normal systolic function.The right ventricular size is normal.  4. Left atrial size was normal.  5. Right atrial size was normal.  6. Mild mitral annular calcification.  7. The mitral valve is normal in structure. No evidence of mitral valve regurgitation. No evidence of mitral stenosis.  8. The tricuspid valve is normal in structure. Tricuspid valve regurgitation is trivial.  9. The aortic valve is normal in structure. Aortic valve regurgitation was not visualized by color flow Doppler. Structurally normal aortic valve, with no evidence of sclerosis or stenosis. 10. The pulmonic valve was not well visualized. Pulmonic valve regurgitation is trivial by color flow Doppler. 11. The inferior vena cava is normal in size with greater than 50% respiratory variability, suggesting right atrial pressure of 3 mmHg. 12. Normal LV systolic  function; grade 1 diastolic dysfunction.   ECG - SR rate 79 BPM. (See cardiology reading for complete details)   PHYSICAL EXAM Blood pressure (!) 119/57, pulse 67, temperature 97.8 F (36.6 C), temperature source Oral, resp. rate 16, height 5\' 3"  (1.6 m), weight 61.2 kg, SpO2 95 %. Pleasant elderly Caucasian lady sitting comfortably in the bedside chair.  Not in distress. . Afebrile. Head is nontraumatic. Neck is supple without bruit.    Cardiac exam no murmur or gallop. Lungs are clear to auscultation. Distal pulses are well felt.  Neurological Exam ;  Awake  Alert oriented x 3. Normal speech and language.eye movements full without nystagmus.fundi were not visualized. Vision acuity and fields appear normal. Hearing is normal. Palatal movements are normal. Face symmetric. Tongue midline. Normal strength, tone, reflexes and coordination. Normal sensation. Gait deferred.        ASSESSMENT/PLAN Ms. BRINAE WOODS is a 78 y.o. female with history of diabetes, hypertension, hyperlipidemia, seasonal allergies who was seen at [facility] with multiple falls and right sided weakness. She  was transferred to [facility] for further workup and is now close to baseline. She did not receive IV t-PA due to late presentation (>4.5 hours from time of onset).  Probable TIA: Severe left PCA P3 segment stenosis  Resultant  No deficits  Code Stroke CT Head - not performed  CT head - No acute intracranial abnormality.   2. 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  MRI head - Advanced atrophy and small vessel disease. No acute intracranial findings.   MRA head - not performed  CTA H&N - Severe left PCA P3 segment stenosis. Moderate cerebral atrophy and severe chronic small vessel ischemic disease without evidence of acute intracranial abnormality.  CT Perfusion - not performed  Carotid Doppler - CTA neck performed - carotid dopplers not indicated.  2D Echo - EF 60 -  65%. No cardiac source of emboli identified.   Sars Corona Virus 2 - negative  LDL - 86  HgbA1c - 6.2  UDS - negative  VTE prophylaxis - Lovenox Diet  Diet Order            Diet heart healthy/carb modified Fluid consistency: Thin  Diet effective ____               aspirin 81 mg daily prior to admission, now on aspirin 81 mg and Plavix 75 mg  Daily x 3 weeks and then Plavix alone  Patient counseled to be compliant with her antithrombotic medications  Ongoing aggressive stroke risk factor management  Therapy recommendations:  HH PT and OT recommended  Disposition:  Pending  Hypertension  Home BP meds: Altace  Current BP meds: none  Stable . Permissive hypertension (OK if < 220/120) but gradually normalize in 5-7 days  . Long-term BP goal normotensive  Hyperlipidemia  Home Lipid lowering medication:  Lipitor 20 mg daily  LDL 86, goal < 70  Current lipid lowering medication: Lipitor 40 mg daily   Continue statin at discharge  Diabetes  Home diabetic meds: Metformin  Current diabetic meds: SSI  HgbA1c 6.2, goal < 7.0 Recent Labs    08/19/19 1609 08/19/19 2111 08/20/19 0608  GLUCAP 223* 144* 129*     Other Stroke Risk Factors  Advanced age  Other Active Problems  Right fifth toe fx  Hospital day # 0 I have personally obtained history,examined this patient, reviewed notes, independently viewed imaging studies, participated in medical decision making and plan of care.ROS completed by me personally and pertinent positives fully documented  I have made any additions or clarifications directly to the above note.  She presented with transient right hemiparesis likely due to left hemispheric subcortical TIA from small vessel disease.  Stroke work-up was unremarkable except mildly elevated lipids.  Recommend aspirin and Plavix for 3 weeks followed by Plavix alone and aggressive risk factor modification.  Follow-up as an outpatient in the stroke clinic in  2 months.  Discussed with Dr. Eliseo Squires.  Greater than 50% time during this 25-minute visit was spent on counseling and coordination of care about her TIA and discussion about stroke risk prevention and treatment  Antony Contras, MD Medical Director [place] Pager: [phone] 08/20/2019 2:04 PM   To contact Stroke Continuity provider, please refer to [email or web address] After hours, contact General Neurology

## 2019-08-20 NOTE — Progress Notes (Signed)
Patient being discharged home.  Patient to be transported by her sister.  IV removed with the catheter intact.  Discharge instructions and prescription information given to the patient who verbalized understanding.

## 2019-08-28 DIAGNOSIS — G459 Transient cerebral ischemic attack, unspecified: Secondary | ICD-10-CM | POA: Diagnosis not present

## 2019-08-28 DIAGNOSIS — Z8279 Family history of other congenital malformations, deformations and chromosomal abnormalities: Secondary | ICD-10-CM | POA: Diagnosis not present

## 2019-08-28 DIAGNOSIS — I679 Cerebrovascular disease, unspecified: Secondary | ICD-10-CM | POA: Diagnosis not present

## 2019-08-29 ENCOUNTER — Encounter: Payer: Self-pay | Admitting: Neurology

## 2019-09-05 NOTE — Progress Notes (Signed)
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  History supplemented by hospital records.  CT/CTA and MRI brain performed in the hospital personally reviewed.  She is accompanied by her sister via phone.  Her father and paternal uncle had Fragile X-associated tremor/ataxia syndrome.  Her sister is a carrier who has a son with Fragile X syndrome.  His uncle's son also was diagnosed with Fragile X syndrome.  The patient, herself, does not have children.  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  She started having falls a couple of years ago.  Sometimes she just loses her balance while other times her legs give out.  She started using a cane about a year ago.  She denies tremor.  She has never had seizures.  She has no noticeable cognitive impairment per patient and her sister.  [facility] was [facility] 08[facility]18/2019 due to right leg weakness and multiple falls.  She [facility] where telemedicine neurology noted mild right arm and leg drift.  She did not receive tPA as she was outside the therapeutic window.  CT of head showed cerebral atrophy and chronic small vessel ischemic changes but no acute intracranial abnormality.  CTA of the head and neck showed severe left PCA P3 segment stenosis but no large vessel occlusion or flow-limiting proximal stenosis.  MRI of the brain without contrast showed no acute intracranial abnormalities.  Transthoracic echocardiogram demonstrated left ventricular ejection fraction of 60 to 65% with no cardiac source of emboli identified.  EKG and telemetry revealed no  significant arrhythmia.  LDL was 86 and Hgb A1c was 6.2.  Prior to admission, she was on aspirin 81 mg daily.  She was started on dual antiplatelet therapy for 3 weeks, followed by Plavix alone.  Prior to admission, she was on Lipitor 20 mg daily, which was subsequently increased to 40   PAST MEDICAL HISTORY: Past Medical History:  Diagnosis Date  . Diabetes mellitus   . Dyslipidemia   . Hypertension   . Osteoporosis   . Urinary incontinence     PAST SURGICAL HISTORY: No past surgical history on file.  MEDICATIONS: Current Outpatient Medications on File Prior to Visit  Medication Sig Dispense Refill  . alendronate (FOSAMAX) 70 MG tablet Take 70 mg by mouth once a week. Sundays    . aspirin 81 MG tablet Take 1 tablet (81 mg total) by mouth daily for 21 days. 30 tablet   . atorvastatin (LIPITOR) 40 MG tablet Take 1 tablet (40 mg total) by mouth daily. 30 tablet 0  . Calcium Carbonate-Vitamin D (CALTRATE 600+D PO) Take 1 tablet by mouth 2 (two) times daily.    . clopidogrel (PLAVIX) 75 MG tablet Take 1 tablet (75 mg total) by mouth daily. 30 tablet 2  . metFORMIN (GLUCOPHAGE) 500 MG tablet Take 500-1,000 mg by mouth See admin instructions. 2 tablets with breakfast and 1 with dinner    . Multiple Vitamins-Minerals (CENTRUM SILVER PO) Take 1 tablet by mouth daily.    . ramipril (ALTACE) 2.5 MG capsule Take 2.5 mg by mouth daily.      . solifenacin (VESICARE) 5 MG tablet Take  5 mg by mouth daily.    Marland Kitchen VITAMIN D PO Take 1 tablet by mouth daily.     No current facility-administered medications on file prior to visit.     ALLERGIES: Allergies  Allergen Reactions  . Amoxicillin-Pot Clavulanate Hives  . Meloxicam   . Scallops [Shellfish Allergy] Other (See Comments)    "I don't feel so good after an hour"  . Toviaz [Fesoterodine Fumarate Er]     FAMILY HISTORY: Family History  Problem Relation Age of Onset  . Breast cancer Neg Hx   . Stroke Neg Hx   .  SOCIAL HISTORY: Social  History   Socioeconomic History  . Marital status: Single    Spouse name: Not on file  . Number of children: Not on file  . Years of education: Not on file  . Highest education level: Not on file  Occupational History  . Occupation: retired  Engineer, production  . Financial resource strain: Not on file  . Food insecurity    Worry: Not on file    Inability: Not on file  . Transportation needs    Medical: Not on file    Non-medical: Not on file  Tobacco Use  . Smoking status: Never Smoker  . Smokeless tobacco: Never Used  Substance and Sexual Activity  . Alcohol use: No  . Drug use: No  . Sexual activity: Not on file  Lifestyle  . Physical activity    Days per week: Not on file    Minutes per session: Not on file  . Stress: Not on file  Relationships  . Social Musician on phone: Not on file    Gets together: Not on file    Attends religious service: Not on file    Active member of club or organization: Not on file    Attends meetings of clubs or organizations: Not on file    Relationship status: Not on file  . Intimate partner violence    Fear of current or ex partner: Not on file    Emotionally abused: Not on file    Physically abused: Not on file    Forced sexual activity: Not on file  Other Topics Concern  . Not on file  Social History Narrative  . Not on file    REVIEW OF SYSTEMS: Constitutional: No fevers, chills, or sweats, no generalized fatigue, change in appetite Eyes: No visual changes, double vision, eye pain Ear, nose and throat: No hearing loss, ear pain, nasal congestion, sore throat Cardiovascular: No chest pain, palpitations Respiratory:  No shortness of breath at rest or with exertion, wheezes GastrointestinaI: No nausea, vomiting, diarrhea, abdominal pain, fecal incontinence Genitourinary:  No dysuria, urinary retention or frequency Musculoskeletal:  No neck pain, back pain Integumentary: No rash, pruritus, skin lesions Neurological: as  above Psychiatric: No depression, insomnia, anxiety Endocrine: No palpitations, fatigue, diaphoresis, mood swings, change in appetite, change in weight, increased thirst Hematologic/Lymphatic:  No purpura, petechiae. Allergic/Immunologic: no itchy/runny eyes, nasal congestion, recent allergic reactions, rashes  PHYSICAL EXAM: Blood pressure 126/65, pulse 78, height 5\' 3"  (1.6 m), weight 129 lb 6.4 oz (58.7 kg), SpO2 100 %. General: No acute distress.  Patient appears well-groomed.   Head:  Normocephalic/atraumatic Eyes:  fundi examined but not visualized Neck: supple, no paraspinal tenderness, full range of motion Back: No paraspinal tenderness Heart: regular rate and rhythm Lungs: Clear to auscultation bilaterally. Vascular: No carotid bruits. Neurological Exam: Mental status: alert and oriented to person, place,  and time, recent and remote memory intact, fund of knowledge intact, attention and concentration intact, speech fluent and not dysarthric, language intact. Cranial nerves: CN I: not tested CN II: pupils equal, round and reactive to light, visual fields intact CN III, IV, VI:  full range of motion, no nystagmus, no ptosis CN V: facial sensation intact CN VII: upper and lower face symmetric CN VIII: hearing intact CN IX, X: gag intact, uvula midline CN XI: sternocleidomastoid and trapezius muscles intact CN XII: tongue midline Bulk & Tone: normal, no fasciculations. Motor:  5/5 throughout  Sensation:  Pinprick and vibration sensation intact. Deep Tendon Reflexes:  2+ throughout, toes downgoing. Finger to nose testing:  Without dysmetria.  Heel to shin:  Without dysmetria.  Gait:  Normal station and stride.  Romberg negative.  IMPRESSION: 1.  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  It is certainly possible.  It is difficult to make a diagnosis as she has no children and she does not exhibit other symptoms associated with the disease (tremor, seizures).   We can pursue genetic testing.  However, I wouldn't push it as it would not change management and she is not young with plans to have children.  She agrees. 2.  Probable transient ischemic attack presenting with right sided weakness   PLAN: 1.  Monitor symptoms (gait/weakness). 2.  Encouraged exercise 3.  At end of 21 days, discontinue ASA and continue Plavix alone. 4.  Lipitor 40 mg daily (LDL goal less than 70) 5.  Continue optimize blood pressure and glycemic control. 6.  Mediterranean diet and routine exercise. 7.  Follow-up in 6 months  Thank you for allowing me to take part in the care of this patient.  Metta Clines, DO  CC: Wenda Low, MD

## 2019-09-06 ENCOUNTER — Ambulatory Visit: Payer: Medicare Other | Admitting: Neurology

## 2019-09-06 ENCOUNTER — Other Ambulatory Visit: Payer: Self-pay

## 2019-09-06 ENCOUNTER — Encounter: Payer: Self-pay | Admitting: Neurology

## 2019-09-06 VITALS — BP 126/65 | HR 78 | Ht 63.0 in | Wt 129.4 lb

## 2019-09-06 DIAGNOSIS — G459 Transient cerebral ischemic attack, unspecified: Secondary | ICD-10-CM | POA: Diagnosis not present

## 2019-09-06 DIAGNOSIS — R296 Repeated falls: Secondary | ICD-10-CM | POA: Diagnosis not present

## 2019-09-06 NOTE — Patient Instructions (Signed)
Follow up in 6 months 

## 2019-09-28 DIAGNOSIS — M79671 Pain in right foot: Secondary | ICD-10-CM | POA: Diagnosis not present

## 2019-12-21 DIAGNOSIS — G459 Transient cerebral ischemic attack, unspecified: Secondary | ICD-10-CM | POA: Diagnosis not present

## 2019-12-21 DIAGNOSIS — E1122 Type 2 diabetes mellitus with diabetic chronic kidney disease: Secondary | ICD-10-CM | POA: Diagnosis not present

## 2019-12-21 DIAGNOSIS — E782 Mixed hyperlipidemia: Secondary | ICD-10-CM | POA: Diagnosis not present

## 2019-12-21 DIAGNOSIS — N1831 Chronic kidney disease, stage 3a: Secondary | ICD-10-CM | POA: Diagnosis not present

## 2020-01-23 ENCOUNTER — Other Ambulatory Visit: Payer: Self-pay | Admitting: Internal Medicine

## 2020-01-23 DIAGNOSIS — Z1231 Encounter for screening mammogram for malignant neoplasm of breast: Secondary | ICD-10-CM

## 2020-02-19 ENCOUNTER — Ambulatory Visit
Admission: RE | Admit: 2020-02-19 | Discharge: 2020-02-19 | Disposition: A | Discharge status: Home / Self Care | Payer: Medicare Other | Source: Ambulatory Visit | Attending: Internal Medicine | Admitting: Internal Medicine

## 2020-02-19 ENCOUNTER — Other Ambulatory Visit: Payer: Self-pay

## 2020-02-19 DIAGNOSIS — Z1231 Encounter for screening mammogram for malignant neoplasm of breast: Secondary | ICD-10-CM
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
## 2020-03-05 ENCOUNTER — Other Ambulatory Visit: Payer: Self-pay

## 2020-03-05 ENCOUNTER — Encounter: Payer: Self-pay | Admitting: Neurology

## 2020-03-05 ENCOUNTER — Ambulatory Visit: Payer: Medicare Other | Admitting: Neurology

## 2020-03-05 VITALS — BP 109/67 | HR 88 | Resp 18 | Ht 63.0 in | Wt 126.0 lb

## 2020-03-05 DIAGNOSIS — Z8279 Family history of other congenital malformations, deformations and chromosomal abnormalities: Secondary | ICD-10-CM | POA: Diagnosis not present

## 2020-03-05 DIAGNOSIS — G459 Transient cerebral ischemic attack, unspecified: Secondary | ICD-10-CM | POA: Diagnosis not present

## 2020-03-05 DIAGNOSIS — R419 Unspecified symptoms and signs involving cognitive functions and awareness: Secondary | ICD-10-CM

## 2020-03-05 NOTE — Patient Instructions (Addendum)
1.  Continue clopidogrel, atorvastatin, and metformin 2.  Will refer you for neurocognitive testing 3.  Refrain from driving until further notice. 4.  Follow up after testing.

## 2020-03-07 DIAGNOSIS — H5201 Hypermetropia, right eye: Secondary | ICD-10-CM | POA: Diagnosis not present

## 2020-03-07 DIAGNOSIS — H524 Presbyopia: Secondary | ICD-10-CM | POA: Diagnosis not present

## 2020-03-07 DIAGNOSIS — H5213 Myopia, bilateral: Secondary | ICD-10-CM | POA: Diagnosis not present

## 2020-03-07 DIAGNOSIS — H52223 Regular astigmatism, bilateral: Secondary | ICD-10-CM | POA: Diagnosis not present

## 2020-03-07 DIAGNOSIS — E119 Type 2 diabetes mellitus without complications: Secondary | ICD-10-CM | POA: Diagnosis not present

## 2020-03-20 DIAGNOSIS — E1122 Type 2 diabetes mellitus with diabetic chronic kidney disease: Secondary | ICD-10-CM | POA: Diagnosis not present

## 2020-03-20 DIAGNOSIS — R419 Unspecified symptoms and signs involving cognitive functions and awareness: Secondary | ICD-10-CM | POA: Diagnosis not present

## 2020-03-20 DIAGNOSIS — G459 Transient cerebral ischemic attack, unspecified: Secondary | ICD-10-CM | POA: Diagnosis not present

## 2020-03-20 DIAGNOSIS — E782 Mixed hyperlipidemia: Secondary | ICD-10-CM | POA: Diagnosis not present

## 2020-03-22 ENCOUNTER — Encounter: Payer: Self-pay | Admitting: Counselor

## 2020-03-22 ENCOUNTER — Ambulatory Visit: Payer: Medicare Other

## 2020-03-22 ENCOUNTER — Other Ambulatory Visit: Payer: Self-pay

## 2020-03-22 ENCOUNTER — Ambulatory Visit (INDEPENDENT_AMBULATORY_CARE_PROVIDER_SITE_OTHER): Payer: Medicare Other | Admitting: Counselor

## 2020-03-22 DIAGNOSIS — G250 Essential tremor: Secondary | ICD-10-CM

## 2020-03-22 DIAGNOSIS — R419 Unspecified symptoms and signs involving cognitive functions and awareness: Secondary | ICD-10-CM

## 2020-03-22 DIAGNOSIS — F09 Unspecified mental disorder due to known physiological condition: Secondary | ICD-10-CM | POA: Diagnosis not present

## 2020-03-22 DIAGNOSIS — R4189 Other symptoms and signs involving cognitive functions and awareness: Secondary | ICD-10-CM

## 2020-03-22 DIAGNOSIS — G119 Hereditary ataxia, unspecified: Secondary | ICD-10-CM

## 2020-03-22 DIAGNOSIS — Q992 Fragile X chromosome: Secondary | ICD-10-CM | POA: Diagnosis not present

## 2020-03-22 NOTE — Progress Notes (Signed)
   Psychometrist Note   Cognitive testing was administered to Jaclyn Guzman by Lamar Benes, B.S. (Technician) under the supervision of Alphonzo Severance, Psy.D., ABN. Ms. Fitzner was able to tolerate all test procedures. Dr. Nicole Kindred met with the patient as needed to manage any emotional reactions to the testing procedures (if applicable). Rest breaks were offered.    The battery of tests administered was selected by Dr. Nicole Kindred with consideration to the patient's current level of functioning, the nature of her symptoms, emotional and behavioral responses during the interview, level of literacy, observed level of motivation/effort, and the nature of the referral question. This battery was communicated to the psychometrist. Communication between Dr. Nicole Kindred and the psychometrist was ongoing throughout the evaluation and Dr. Nicole Kindred was immediately accessible at all times. Dr. Nicole Kindred provided supervision to the technician on the date of this service, to the extent necessary to assure the quality of all services provided.    Ms. Holaday will return in approximately one week for an interactive feedback session with Dr. Nicole Kindred, at which time female test performance, clinical impressions, and treatment recommendations will be reviewed in detail. The patient understands she can contact our office should she require our assistance before this time.   A total of 115 minutes of billable time were spent with Jaclyn Guzman by the technician, including test administration and scoring time. Billing for these services is reflected in Dr. Les Pou note.   This note reflects time spent with the psychometrician and does not include test scores, clinical history, or any interpretations made by Dr. Nicole Kindred. The full report will follow in a separate note.

## 2020-03-22 NOTE — Progress Notes (Signed)
[facility]  Patient Name: Jaclyn Guzman MRN: [id number] Date of Birth: January 19, 1941 Age: 79 y.o. Education: 18 years  Referral Circumstances and Background Information  Ms. 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 Her father had the syndrome, her sister is a carrier, and her nephew has Fragile X syndrome with intellectual disability. Her paternal cousin's son also has the syndrome. On interview, the patient stated that she hasn't noticed changes in memory or thinking. Her sister, on the other hand, has noticed significant difficulties since she became more involved in the patient's life after a hospitalization around October, 2020. The patient had three falls, was admitted to the hospital, and they started having more contact with her at that time. She was living alone and was a bit reclusive previously. They realized that she was hoarding things and "that's when we knew there was something going on." The patient says she was not hoarding, it was just food she had not eaten, but her sister said it was much more extensive and that the house was in a state of squalor. The patient also has poor judgment, she got caught up in a [employer] and lost $1200 in January, 2021. They denied that she is very forgetful. Apparently, the patient has always been a loner and a bit socially off, and they haven't noticed any notable personality changes. They haven't noticed any socially inappropriate behaviors or compulsive/repetitive behaviors. With respect to mood, her spirits are pretty good. She stated that she is sleeping well, usually 6-7 hours. Her appetite is good; she had lost weight at home but now she is back to her normal weight. Her energy is ok. She spends her time watching TV. She gets some exercise, she has started doing Tai Chi and Yoga and she walks up and down the  hallway in her building.   With respect to functioning, the patient has a masters degree and was fairly independent if not a bit socially isolated. She has been living in [facility] since around the beginning of the year. She is apparently much happier, healthier, and safer than she was at home. She was managing all her own finances until about a year ago when her family realized she was having poor judgment and cognitive problems. She has had problems with bill payment and tried to pay a $56 dollar bill with $56,000 dollars, which she wouldn't have done in the past. She tried to rationalize that and said she wrote the wrong thing but her sister reminded her that it was paid electronically. She has been heavily involved in computers her entire life and now, she cannot so much as enter her password correctly. She had her computer taken away but they have set it back up with some safety controls and automatic passwords so she has limited access. She was driving although she is not driving now, partly due to physical problems and partly due to safety concerns. Her sister thinks she used to cook her own meals, although it was limited, but now she basically only heats things in the microwave. She eats one meal a day provided by [facility]. The family have taken over scheduling medical appointments and the like because she has a hard time with it. The patient doesn't go out into the community anymore on her own related to driving. She would like to get her license back but her  sister is clearly quite worried about that.   Past Medical History and Review of Relevant Studies   Patient Active Problem List   Diagnosis Date Noted  . Nondisplaced fracture of proximal phalanx of right lesser toe(s), initial encounter for closed fracture 08/19/2019  . Urinary incontinence   . Hypertension   . Dyslipidemia   . TIA (transient ischemic attack) 08/18/2019   Review of Neuroimaging and Relevant Medical  History: :  The patient has an MRI of the brain from 08/19/2019 that shows fairly significant, likely pathological atrophy that is quite generalized throughout the parenchyma and the cerebellum. Her cerebellar atrophy is significant. There is compensatory widening of the ventricles. 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 The distribution of white matter signal changes is somewhat atypical given the extent of change in the pons, external capsule, and midbrain just below the SCP although there is only minimal hyperintensity in the MCP (L > R).  Current Outpatient Medications  Medication Sig Dispense Refill  . alendronate (FOSAMAX) 70 MG tablet Take 70 mg by mouth once a week. Sundays    . atorvastatin (LIPITOR) 40 MG tablet Take 1 tablet (40 mg total) by mouth daily. 30 tablet 0  . Calcium Carbonate-Vitamin D (CALTRATE 600+D PO) Take 1 tablet by mouth 2 (two) times daily.    . clopidogrel (PLAVIX) 75 MG tablet Take 1 tablet (75 mg total) by mouth daily. 30 tablet 2  . metFORMIN (GLUCOPHAGE) 500 MG tablet Take 500-1,000 mg by mouth See admin instructions. 2 tablets with breakfast and 1 with dinner    . Multiple Vitamins-Minerals (CENTRUM SILVER PO) Take 1 tablet by mouth daily.    . ramipril (ALTACE) 2.5 MG capsule Take 2.5 mg by mouth daily.      . solifenacin (VESICARE) 5 MG tablet Take 10 mg by mouth daily.     Marland Kitchen VITAMIN D PO Take 1 tablet by mouth daily.     No current facility-administered medications for this visit.    Family History  Problem Relation Age of Onset  . Fragile X syndrome Father   . Healthy Sister   . Breast cancer Neg Hx   . Stroke Neg Hx    There is a family history of dementia. The patient's mother developed the condition in her 81s. The patient's father had Fragile X-associated tremor/ataxia syndrome later  in life. No other family members have had that syndrome. There is no family history of psychiatric illness (some of the members of the family with fragile X have autism spectrum disorder but there are no primary psychiatric conditions).  Psychosocial History  Developmental, Educational and Employment History: The patient described herself as a poor student who didn't want to study, but her sister said that actually people would call her and ask for help on their homework because she had a very good mind for math. She was never held back and didn't have any learning difficulties. She went on to get a bachelors degree from [employer] in business administration. Then, she got a masters degree in business administration at [employer]. 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 She retired in 2006 around age 70.   Psychiatric History: The patient has no formal history of psychiatric illness, although it sounds like she may have had some social idiosyncrasies. The family think that when she retired, there was a shift and she had a hard time without the structure.   Substance Use History: The patient denied any significant history of alcohol use issues, substance use issues, or smoking.   Relationship History and Living Cimcumstances: The patient has never been married. She has no children.   Mental Status and Behavioral Observations  Sensorium/Arousal: The patient's level of arousal was awake and alert. Hearing and vision were adequate for testing purposes. Orientation: The patient was alert and fully oriented to person, place, time, and situation.  Appearance: Dressed in appropriate, casual  clothing.  Behavior: The patient presented as having very poor insight. She was socially appropriate.  Speech/language: Speech was normal in rate, rhythm, and volume.  Gait/Posture: Gait was not well observed, noted to be somewhat wide based with Dr. Tomi Likens on exam. She ambulated using a rollator.  Movement: No overt signs of movement disorder noted on observation.  Social Comportment: Pleasant, appropriate.  Mood: The patient reported that she is in good spirits lately.  Affect: Mainly neutral Thought process/content: Thought process presented as logical, linear, and goal directed. Thought content was appropriate to the topics discussed although often the things she said were inaccurate.  Safety: Any thoughts of harming self or others denied on direct questioning.  Insight: The patient demonstrated significant, profound anosognosia and does not think anything is wrong despite objective evidence of changes.   Montreal Cognitive Assessment  03/22/2020  Visuospatial/ Executive (0/5) 2  Naming (0/3) 3  Attention: Read list of digits (0/2) 2  Attention: Read list of letters (0/1) 0  Attention: Serial 7 subtraction starting at 100 (0/3) 2  Language: Repeat phrase (0/2) 1  Language : Fluency (0/1) 0  Abstraction (0/2) 2  Delayed Recall (0/5) 2  Orientation (0/6) 6  Total 20   Test Procedures  Wide Range Achievement Test - 4             Word Reading Neuropsychological Assessment Battery  List Learning  Story Learning  Daily Living Memory  Naming  Digit Span Repeatable Battery for the Assessment of Neuropsychological Status (Form A)  Figure Copy  Judgment of Line Orientation  Coding  Figure Recall The Dot Counting Test A Random Letter Test Controlled Oral Word Association (F-A-S) Semantic Fluency (Animals) Trail Making Test A & B Complex Ideational Material Similarities (Wechsler Adult Intelligence Test - IV) Modified [place] Card Sorting Test Geriatric Depression Scale - Short  Form Functional Activities Questionnaire (completed by sister, Gay Filler)  Plan  ICHELLE HARRAL was seen for a psychiatric diagnostic evaluation and neuropsychological testing. She has a history of difficulty taking care of herself mainly due to hoarding, poor judgment, and poor real world functioning. She is screening in the MCI range although I have the feeling that much of her difficulties involve executive functions, and such patients can do much more poorly in the real world than they do on testing. Her MRI shows diffuse brain atrophy inclusive of the cerebellum and a somewhat atypical pattern of hyperintensities with prominent leukoaraiosis in the pons, the external capsule, and the midbrain below the SCPs, although she has only minimal MCP hyperintensity (L >R). Similar findings are characteristic of Fragile X associated ataxia/tremor syndrome  although typically with prominent MCP hyperintensity due to atrophy of the ponto-cerebellar fibers. Full and complete note with impressions, recommendations, and interpretation of test data to follow.   Viviano Simas Nicole Kindred, PsyD, [place] Clinical Neuropsychologist  Informed Consent and Coding/Compliance  Risks and benefits of the evaluation were discussed with the patient prior to all testing procedures. I conducted a clinical interview and neuropsychological testing (at least two tests) with Jacqulyn Cane and Lamar Benes, B.S. (Technician) assisted me in administering additional test procedures. The patient was able to tolerate the testing procedures and the patient (and/or family if applicable) is likely to benefit from further follow up to receive the diagnosis and treatment recommendations, which will be rendered at the next encounter. Billing below reflects technician time, my direct face-to-face time with the patient, time spent in test administration, and time spent in professional activities including but not limited to: neuropsychological test  interpretation, integration of neuropsychological test data with clinical history, report preparation, treatment planning, care coordination, and review of diagnostically pertinent medical history or studies.   Services associated with this encounter: Clinical Interview [phone]) plus 60 minutes (37169; Neuropsychological Evaluation by Professional)  180 minutes (67893; Neuropsychological Evaluation by Professional, Adl.) 30 minutes (81017; Test Administration by Professional) 30 minutes (51025; Neuropsychological Testing by Technician) 85 minutes (85277; Neuropsychological Testing by Technician, Adl.)

## 2020-03-29 ENCOUNTER — Encounter: Payer: Self-pay | Admitting: Counselor

## 2020-03-29 ENCOUNTER — Encounter: Payer: Medicare Other | Admitting: Counselor

## 2020-03-29 NOTE — Progress Notes (Signed)
[facility]  Patient Name: Jaclyn Guzman MRN: [id number] Date of Birth: [birth year] Age: 79 y.o. Education: 18 years  Measurement properties of test scores: IQ, Index, and Standard Scores (SS): Mean = 100; Standard Deviation = 15 Scaled Scores (Ss): Mean = 10; Standard Deviation = 3 Z scores (Z): Mean = 0; Standard Deviation = 1 T scores (T); Mean = 50; Standard Deviation = 10  TEST SCORES:    Note: This summary of test scores accompanies the interpretive report and should not be considered in isolation without reference to the appropriate sections in the text. Test scores are relative to age, gender, and educational history as available and appropriate.   Performance Validity        "A" Random Letter Test Raw  Descriptor      Errors 0 Within Expectation  The Dot Counting Test: 13 Within Expectation      Mental Status Screening     Total Score Descriptor  MoCA 20 MCI      Expected Functioning        Wide Range Achievement Test: Standard/Scaled Score Percentile      Word Reading 107 68      Reynolds Intellectual Screening Test Standard/T-score Percentile      Guess What 55 70      Odd Item Out 58 79  RIST Index 112 79      Attention/Processing Speed        Neuropsychological Assessment Battery (Attention Module, Form 1): T-score Percentile      Digits Forward 42 21      Digits Backwards 40 16      Repeatable Battery for the Assessment of Neuropsychological Status (Form A): Scaled Score Percentile      Coding 7 16      Language        Neuropsychological Assessment Battery (Language Module, Form 1): T-score Percentile      Naming   (29) 47 38      Verbal Fluency: T-score Percentile      Controlled Oral Word Association (F-A-S) 27 1      Semantic Fluency (Animals) 23 <1      Memory:        Neuropsychological Assessment Battery (Memory Module, Form 1): T-score Percentile      List Learning           List A Immediate  Recall   (2, 4, 4) 19 <1         List B Immediate Recall   (3) 40 16         List A Short Delayed Recall   (2) 23 <1         List A Long Delayed Recall   (3) 29 2         List A Long Delayed Yes/No Recognition Hits   (11) --- 57         List A Long Delayed Yes/No Recognition False Alarms   (4) --- 62         List A Recognition Discriminability Index --- 62     Story Learning           Immediate Recall   (26, 32) 43 25         Delayed Recall   (26) 37 9      Daily Living Memory            Immediate Recall   (20, 16) 37 9  Delayed Recall   (8, 7) 54 66          Recognition Hits    (9) --- 69      Repeatable Battery for the Assessment of Neuropsychological Status (Form A): Scaled Score Percentile         Figure Recall   (10) 8 25      Visuospatial/Constructional Functioning        Repeatable Battery for the Assessment of Neuropsychological Status (Form A): Standard/Scaled Score Percentile     Visuospatial/Constructional Index 100 50         Figure Copy   (19) 12 75         Judgment of Line Orientation   (15) --- 26-50      Executive Functioning        Modified [employer] Test (MWCST): Standard/T-Score Percentile      Number of Categories Correct 31 3      Number of Perseverative Errors 39 14      Number of Total Errors 34 6      Percent Perseverative Errors 51 54  Executive Function Composite 75 5      Trail Making Test: T-Score Percentile      Part A 34 5      Part B 37 9      Boston Diagnostic Aphasia Exam: Raw Score Scaled Score      Complex Ideational Material 10 7      Clock Drawing Raw Score Descriptor      Command 6 Mild Impairment      Rating Scales         Raw Score Descriptor  Functional Activities Questionaire 23 Impaired Function  Geriatric Depression Scale - Short Form 2 WNL    Inna Tisdell V. Roseanne Reno PsyD, ABN Clinical Neuropsychologist

## 2020-04-01 NOTE — Progress Notes (Signed)
[facility]  Patient Name: Jaclyn Guzman MRN: [id number] Date of Birth: 21-Sep-1941 Age: 79 y.o. 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 She had a series of falls in October, 2020 and her family became more involved in her life at that time. They noted that she was living in a state of squalor and was hoarding. She was also having significant problems with judgment and problem solving and using computers (patient was a Dance movement psychotherapist). They felt that she was no longer safe to live on her own and she has been residing at [facility] since then. She is doing better but is quite functionally impaired and has essentially no instrumental activities of daily living. Her facility helps with meal preparation, her family helps with finances, and she is not driving but would like to return to driving. 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   Jaclyn Guzman cognitive test performance suggests objective changes mostly affecting frontal-subcortical abilities. She had difficulties with memory encoding and did much better recalling structured as opposed to unstructured information, a so called "executive" memory profile. She also had low scores on dedicated measures of executive function involving concept formation, cognitive flexibility, and ideational generativity. Processing speed and attention/working memory were weak but not frankly impaired. Visuospatial/constructional functioning and naming were within normal limits. She screened negative for the presence of depression and her sister characterized her as having significant functional difficulties, equivalent to a dementia level problem, on the  Functional Activities Questionnaire.   Jaclyn Guzman is demonstrating a dementia level cognitive problem, characterized by difficulties primarily in frontal-subcortical and executive abilities. Her testing is somewhat better than would be expected given her real world functioning, which is often the case in individuals with primarily executive difficulties. 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 This is with the caveat that neuropsychological testing is not sufficiently specific for diagnosis and she is not showing the movement features one might expect. Frontotemporal degenerative and vascular disease are also within the broad differential but are felt to be much less likely due to qualitative imaging, neuropsychological, and behavioral features.   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 In your case, you are testing somewhat better than you are functioning in the real world, which is often the case when difficulties are executive in nature. Due to the fact that you cannot live independently anymore I think Major Neurocognitive Disorder (which is synonymous with the term "dementia") is the best descriptor for the level of problem you are demonstrating.   Dementia refers to a group of syndromes where multiple areas of ability are damaged in the brain, such as memory, thinking, judgment, and behavior, and most commonly refers to age related causes of dementia that cause worsening in these abilities over time. Alzheimer's disease is the most common form of dementia in people  over the age of 70. Not all dementias are  Alzheimer's disease, but all Alzheimer's disease is dementia. When dementia is due to an underlying condition affecting the brain, such as Alzheimer's disease, there is progression over time, which typically procedes gradually over many years.   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 Your MRI has a pattern of shrinkage and areas of damage that are very reminiscent of what we would expect in that condition, you have a family history, and your neuropsychological test data are supportive. On the other hand, you do not have the characteristic movement symptoms and genetic testing would be required for definitive diagnosis. This disorder is often more mild in females who do not always develop the full motor phenotype.   You have already discussed genetic testing with Dr. Everlena Cooper, which would be the next step in the workup if you would like a more definitive answer.   I have concerns about your ability to operate a motor vehicle at this point in time and concur with your family's decision to restrict your driving.  I would recommend that your family provide you with assistance as needed with activities involving judgment and problem solving such as high risk medical and financial decisions, as they are already doing.   I think your current living circumstances are appropriate at this time. If this is related to FXTAS, then your issues will worsen over time and you may need a higher level of care.   Day to day problems with executive functioning may be manifested as: Marland Kitchen Difficulty planning a project. . Trouble estimating how much time a project may take. . Trouble communicating details in an organized sequential manner.  . Difficulty with mental strategies involved in memorization and retrieving information from memory.  . Problems with error correction or troubleshooting or shifting problem-solving  approach, including in interpersonal situations.  . Difficulty in situations where responses are not well-learned, including social and interpersonal problem-solving situations.  . Problems with affect and social judgment. . Situations requiring overcoming strong emotions, overcoming a strong habitual response, or resisting temptation.  Consider the following strategies as potentially helpful for dealing with executive difficulties:  . Set up the environment to support desired behavior. This may include removing distracting stimuli at times when focus is required, laying out items needed to complete a task so that they are immediately available and they do not need to be found, and moving to a different setting if frustration or agitation occurs to refocus on the task at hand.  . The use of external reminders such as alarms, sticky notes, and signs that may help support behavioral success.  Marland Kitchen Help with planning, problem solving, or other complex things by breaking things down into a series of concrete steps that can be communicated in written form.  . Be attentive to behavioral clues that may signal impending agitation, such as physiologic arousal and irritability.  Marland Kitchen Help the individual to identify and express their feelings.  . Consider things in the environment as contributing to behavioral issues, including unmet needs such as hunger, thirst, the need to toilet, or illness.   The use of routines, structured activities, and participation in group activities available through [facility] is highly encouraged. Often times, individuals with executive difficulties have a hard time directing their own behavior and providing them with a set schedule can maximize their functioning.   Test Findings  Test scores are summarized in additional documentation associated with this encounter. Test scores are relative to  age, gender, and educational history as available and appropriate. There were no  concerns about performance validity as all findings fell within normal expectations.   General Intellectual Functioning/Achievement:  Performance on single word reading fell at the upper end of the average range, which presents as a reasonable standard of comparison for the patient's cognitive test data.   Attention and Processing Efficiency: Performance on indicators of attention and working memory was weak but not frankly impaired with low average scores for digit repetition forward and backward.   Timed indicators of processing efficiency generated weak scores with low average performance on timed number-symbol coding and unusually low performance on simple numeric sequencing.   Language: Language findings revealed extremely low verbal fluency performance with intact visual object confrontation naming.   Visuospatial Function: Performance on visuospatial/constructional indicators was within normal limits with an overall average score on the visuospatial/constructional index of the RBANS. Figure copy was average and judgment of angular line orientations was also average.   Learning and Memory: Performance on learning and memory measures was likely undermined by executive and frontal-subcortical problems. The profile shows diminished encoding of unstructured as compared to structured information and delayed free recall as compared to recognition.   In the verbal realm, immediate recall for a 12-item word list was extremely low across three repetitions followed by comparable extremely low short and long delayed recall scores. Nevertheless, she did retain most of that information across time and demonstrated good delayed recognition performance, suggesting there may be an element of a retrieval issue. Memory for a short story was better with low average immediate and delayed recall. Memory for brief daily living information was low average on immediate recall and average on delayed recall with  good, average range delayed recognition.   Memory for visual information in the form of a modestly complex geometric figure was average.   Executive Functions: Performance across the test battery suggested significant executive problems and her scores were also low on numerous measures within this domain. She generated an unusually low Executive Function Composite score on the Modified [employer]. Alternating sequencing of numbers and letters of the alphabet was weak, at the margin of the unusually low and low average range. Generation of words in response to the letters F-A-S was extremely low. Her clock drawing was impaired with gross spatial distortions in the numbers and only one hand. Reasoning with verbal information was better and fell at a low average level on the Complex [employer]ational Material.   Rating Scale(s): Jaclyn Guzman screened negative for the presence of depression. She was characterized as having substantial functional difficulties by her sister, who rated her as dependent in 5/10 areas including with financial management, keeping track of papers and records, preparing balanced meals, remembering obligations, and traveling out of the neighborhood.   Jaclyn Boeck Roseanne Reno PsyD, ABN Clinical Neuropsychologist

## 2020-04-08 DIAGNOSIS — R531 Weakness: Secondary | ICD-10-CM | POA: Diagnosis not present

## 2020-04-08 DIAGNOSIS — R2681 Unsteadiness on feet: Secondary | ICD-10-CM | POA: Diagnosis not present

## 2020-04-08 DIAGNOSIS — R2689 Other abnormalities of gait and mobility: Secondary | ICD-10-CM | POA: Diagnosis not present

## 2020-04-08 DIAGNOSIS — M6281 Muscle weakness (generalized): Secondary | ICD-10-CM | POA: Diagnosis not present

## 2020-04-09 DIAGNOSIS — R41841 Cognitive communication deficit: Secondary | ICD-10-CM | POA: Diagnosis not present

## 2020-04-09 DIAGNOSIS — M6281 Muscle weakness (generalized): Secondary | ICD-10-CM | POA: Diagnosis not present

## 2020-04-09 DIAGNOSIS — R419 Unspecified symptoms and signs involving cognitive functions and awareness: Secondary | ICD-10-CM | POA: Diagnosis not present

## 2020-04-09 DIAGNOSIS — R531 Weakness: Secondary | ICD-10-CM | POA: Diagnosis not present

## 2020-04-09 DIAGNOSIS — R2681 Unsteadiness on feet: Secondary | ICD-10-CM | POA: Diagnosis not present

## 2020-04-09 DIAGNOSIS — R2689 Other abnormalities of gait and mobility: Secondary | ICD-10-CM | POA: Diagnosis not present

## 2020-04-10 DIAGNOSIS — R41841 Cognitive communication deficit: Secondary | ICD-10-CM | POA: Diagnosis not present

## 2020-04-10 DIAGNOSIS — R419 Unspecified symptoms and signs involving cognitive functions and awareness: Secondary | ICD-10-CM | POA: Diagnosis not present

## 2020-04-10 DIAGNOSIS — R2681 Unsteadiness on feet: Secondary | ICD-10-CM | POA: Diagnosis not present

## 2020-04-10 DIAGNOSIS — R2689 Other abnormalities of gait and mobility: Secondary | ICD-10-CM | POA: Diagnosis not present

## 2020-04-10 DIAGNOSIS — M6281 Muscle weakness (generalized): Secondary | ICD-10-CM | POA: Diagnosis not present

## 2020-04-10 DIAGNOSIS — R531 Weakness: Secondary | ICD-10-CM | POA: Diagnosis not present

## 2020-04-11 ENCOUNTER — Encounter: Payer: Medicare Other | Admitting: Counselor

## 2020-04-11 ENCOUNTER — Other Ambulatory Visit: Payer: Self-pay

## 2020-04-11 DIAGNOSIS — R419 Unspecified symptoms and signs involving cognitive functions and awareness: Secondary | ICD-10-CM | POA: Diagnosis not present

## 2020-04-11 DIAGNOSIS — R41841 Cognitive communication deficit: Secondary | ICD-10-CM | POA: Diagnosis not present

## 2020-04-11 DIAGNOSIS — M6281 Muscle weakness (generalized): Secondary | ICD-10-CM | POA: Diagnosis not present

## 2020-04-11 DIAGNOSIS — R2689 Other abnormalities of gait and mobility: Secondary | ICD-10-CM | POA: Diagnosis not present

## 2020-04-11 DIAGNOSIS — R531 Weakness: Secondary | ICD-10-CM | POA: Diagnosis not present

## 2020-04-11 DIAGNOSIS — R2681 Unsteadiness on feet: Secondary | ICD-10-CM | POA: Diagnosis not present

## 2020-04-12 DIAGNOSIS — R419 Unspecified symptoms and signs involving cognitive functions and awareness: Secondary | ICD-10-CM | POA: Diagnosis not present

## 2020-04-12 DIAGNOSIS — R41841 Cognitive communication deficit: Secondary | ICD-10-CM | POA: Diagnosis not present

## 2020-04-12 DIAGNOSIS — R531 Weakness: Secondary | ICD-10-CM | POA: Diagnosis not present

## 2020-04-12 DIAGNOSIS — R2689 Other abnormalities of gait and mobility: Secondary | ICD-10-CM | POA: Diagnosis not present

## 2020-04-12 DIAGNOSIS — M6281 Muscle weakness (generalized): Secondary | ICD-10-CM | POA: Diagnosis not present

## 2020-04-12 DIAGNOSIS — R2681 Unsteadiness on feet: Secondary | ICD-10-CM | POA: Diagnosis not present

## 2020-04-15 DIAGNOSIS — M6281 Muscle weakness (generalized): Secondary | ICD-10-CM | POA: Diagnosis not present

## 2020-04-15 DIAGNOSIS — R2681 Unsteadiness on feet: Secondary | ICD-10-CM | POA: Diagnosis not present

## 2020-04-15 DIAGNOSIS — R419 Unspecified symptoms and signs involving cognitive functions and awareness: Secondary | ICD-10-CM | POA: Diagnosis not present

## 2020-04-15 DIAGNOSIS — R531 Weakness: Secondary | ICD-10-CM | POA: Diagnosis not present

## 2020-04-15 DIAGNOSIS — R2689 Other abnormalities of gait and mobility: Secondary | ICD-10-CM | POA: Diagnosis not present

## 2020-04-15 DIAGNOSIS — R41841 Cognitive communication deficit: Secondary | ICD-10-CM | POA: Diagnosis not present

## 2020-04-16 DIAGNOSIS — R41841 Cognitive communication deficit: Secondary | ICD-10-CM | POA: Diagnosis not present

## 2020-04-16 DIAGNOSIS — M6281 Muscle weakness (generalized): Secondary | ICD-10-CM | POA: Diagnosis not present

## 2020-04-16 DIAGNOSIS — R419 Unspecified symptoms and signs involving cognitive functions and awareness: Secondary | ICD-10-CM | POA: Diagnosis not present

## 2020-04-16 DIAGNOSIS — R2681 Unsteadiness on feet: Secondary | ICD-10-CM | POA: Diagnosis not present

## 2020-04-16 DIAGNOSIS — R531 Weakness: Secondary | ICD-10-CM | POA: Diagnosis not present

## 2020-04-16 DIAGNOSIS — R2689 Other abnormalities of gait and mobility: Secondary | ICD-10-CM | POA: Diagnosis not present

## 2020-04-17 DIAGNOSIS — R2681 Unsteadiness on feet: Secondary | ICD-10-CM | POA: Diagnosis not present

## 2020-04-17 DIAGNOSIS — R419 Unspecified symptoms and signs involving cognitive functions and awareness: Secondary | ICD-10-CM | POA: Diagnosis not present

## 2020-04-17 DIAGNOSIS — R41841 Cognitive communication deficit: Secondary | ICD-10-CM | POA: Diagnosis not present

## 2020-04-17 DIAGNOSIS — R531 Weakness: Secondary | ICD-10-CM | POA: Diagnosis not present

## 2020-04-17 DIAGNOSIS — M6281 Muscle weakness (generalized): Secondary | ICD-10-CM | POA: Diagnosis not present

## 2020-04-17 DIAGNOSIS — R2689 Other abnormalities of gait and mobility: Secondary | ICD-10-CM | POA: Diagnosis not present

## 2020-04-19 DIAGNOSIS — R419 Unspecified symptoms and signs involving cognitive functions and awareness: Secondary | ICD-10-CM | POA: Diagnosis not present

## 2020-04-19 DIAGNOSIS — R41841 Cognitive communication deficit: Secondary | ICD-10-CM | POA: Diagnosis not present

## 2020-04-19 DIAGNOSIS — R2681 Unsteadiness on feet: Secondary | ICD-10-CM | POA: Diagnosis not present

## 2020-04-19 DIAGNOSIS — R531 Weakness: Secondary | ICD-10-CM | POA: Diagnosis not present

## 2020-04-19 DIAGNOSIS — M6281 Muscle weakness (generalized): Secondary | ICD-10-CM | POA: Diagnosis not present

## 2020-04-19 DIAGNOSIS — R2689 Other abnormalities of gait and mobility: Secondary | ICD-10-CM | POA: Diagnosis not present

## 2020-04-20 DIAGNOSIS — R419 Unspecified symptoms and signs involving cognitive functions and awareness: Secondary | ICD-10-CM | POA: Diagnosis not present

## 2020-04-20 DIAGNOSIS — M6281 Muscle weakness (generalized): Secondary | ICD-10-CM | POA: Diagnosis not present

## 2020-04-20 DIAGNOSIS — R41841 Cognitive communication deficit: Secondary | ICD-10-CM | POA: Diagnosis not present

## 2020-04-20 DIAGNOSIS — R2681 Unsteadiness on feet: Secondary | ICD-10-CM | POA: Diagnosis not present

## 2020-04-20 DIAGNOSIS — R531 Weakness: Secondary | ICD-10-CM | POA: Diagnosis not present

## 2020-04-20 DIAGNOSIS — R2689 Other abnormalities of gait and mobility: Secondary | ICD-10-CM | POA: Diagnosis not present

## 2020-04-22 DIAGNOSIS — R419 Unspecified symptoms and signs involving cognitive functions and awareness: Secondary | ICD-10-CM | POA: Diagnosis not present

## 2020-04-22 DIAGNOSIS — R2689 Other abnormalities of gait and mobility: Secondary | ICD-10-CM | POA: Diagnosis not present

## 2020-04-22 DIAGNOSIS — M6281 Muscle weakness (generalized): Secondary | ICD-10-CM | POA: Diagnosis not present

## 2020-04-22 DIAGNOSIS — R531 Weakness: Secondary | ICD-10-CM | POA: Diagnosis not present

## 2020-04-22 DIAGNOSIS — R2681 Unsteadiness on feet: Secondary | ICD-10-CM | POA: Diagnosis not present

## 2020-04-22 DIAGNOSIS — R41841 Cognitive communication deficit: Secondary | ICD-10-CM | POA: Diagnosis not present

## 2020-04-23 DIAGNOSIS — M6281 Muscle weakness (generalized): Secondary | ICD-10-CM | POA: Diagnosis not present

## 2020-04-23 DIAGNOSIS — R419 Unspecified symptoms and signs involving cognitive functions and awareness: Secondary | ICD-10-CM | POA: Diagnosis not present

## 2020-04-23 DIAGNOSIS — R41841 Cognitive communication deficit: Secondary | ICD-10-CM | POA: Diagnosis not present

## 2020-04-23 DIAGNOSIS — R2681 Unsteadiness on feet: Secondary | ICD-10-CM | POA: Diagnosis not present

## 2020-04-23 DIAGNOSIS — R531 Weakness: Secondary | ICD-10-CM | POA: Diagnosis not present

## 2020-04-23 DIAGNOSIS — R2689 Other abnormalities of gait and mobility: Secondary | ICD-10-CM | POA: Diagnosis not present

## 2020-04-24 DIAGNOSIS — R531 Weakness: Secondary | ICD-10-CM | POA: Diagnosis not present

## 2020-04-24 DIAGNOSIS — R41841 Cognitive communication deficit: Secondary | ICD-10-CM | POA: Diagnosis not present

## 2020-04-24 DIAGNOSIS — M6281 Muscle weakness (generalized): Secondary | ICD-10-CM | POA: Diagnosis not present

## 2020-04-24 DIAGNOSIS — R2689 Other abnormalities of gait and mobility: Secondary | ICD-10-CM | POA: Diagnosis not present

## 2020-04-24 DIAGNOSIS — R419 Unspecified symptoms and signs involving cognitive functions and awareness: Secondary | ICD-10-CM | POA: Diagnosis not present

## 2020-04-24 DIAGNOSIS — R2681 Unsteadiness on feet: Secondary | ICD-10-CM | POA: Diagnosis not present

## 2020-04-26 DIAGNOSIS — R419 Unspecified symptoms and signs involving cognitive functions and awareness: Secondary | ICD-10-CM | POA: Diagnosis not present

## 2020-04-26 DIAGNOSIS — R531 Weakness: Secondary | ICD-10-CM | POA: Diagnosis not present

## 2020-04-26 DIAGNOSIS — M6281 Muscle weakness (generalized): Secondary | ICD-10-CM | POA: Diagnosis not present

## 2020-04-26 DIAGNOSIS — R2689 Other abnormalities of gait and mobility: Secondary | ICD-10-CM | POA: Diagnosis not present

## 2020-04-26 DIAGNOSIS — R2681 Unsteadiness on feet: Secondary | ICD-10-CM | POA: Diagnosis not present

## 2020-04-26 DIAGNOSIS — R41841 Cognitive communication deficit: Secondary | ICD-10-CM | POA: Diagnosis not present

## 2020-04-29 DIAGNOSIS — R2689 Other abnormalities of gait and mobility: Secondary | ICD-10-CM | POA: Diagnosis not present

## 2020-04-29 DIAGNOSIS — R419 Unspecified symptoms and signs involving cognitive functions and awareness: Secondary | ICD-10-CM | POA: Diagnosis not present

## 2020-04-29 DIAGNOSIS — R41841 Cognitive communication deficit: Secondary | ICD-10-CM | POA: Diagnosis not present

## 2020-04-29 DIAGNOSIS — M6281 Muscle weakness (generalized): Secondary | ICD-10-CM | POA: Diagnosis not present

## 2020-04-29 DIAGNOSIS — R2681 Unsteadiness on feet: Secondary | ICD-10-CM | POA: Diagnosis not present

## 2020-04-29 DIAGNOSIS — R531 Weakness: Secondary | ICD-10-CM | POA: Diagnosis not present

## 2020-04-30 DIAGNOSIS — R2689 Other abnormalities of gait and mobility: Secondary | ICD-10-CM | POA: Diagnosis not present

## 2020-04-30 DIAGNOSIS — R2681 Unsteadiness on feet: Secondary | ICD-10-CM | POA: Diagnosis not present

## 2020-04-30 DIAGNOSIS — R531 Weakness: Secondary | ICD-10-CM | POA: Diagnosis not present

## 2020-04-30 DIAGNOSIS — R41841 Cognitive communication deficit: Secondary | ICD-10-CM | POA: Diagnosis not present

## 2020-04-30 DIAGNOSIS — M6281 Muscle weakness (generalized): Secondary | ICD-10-CM | POA: Diagnosis not present

## 2020-04-30 DIAGNOSIS — R419 Unspecified symptoms and signs involving cognitive functions and awareness: Secondary | ICD-10-CM | POA: Diagnosis not present

## 2020-05-01 DIAGNOSIS — M6281 Muscle weakness (generalized): Secondary | ICD-10-CM | POA: Diagnosis not present

## 2020-05-01 DIAGNOSIS — R41841 Cognitive communication deficit: Secondary | ICD-10-CM | POA: Diagnosis not present

## 2020-05-01 DIAGNOSIS — R531 Weakness: Secondary | ICD-10-CM | POA: Diagnosis not present

## 2020-05-01 DIAGNOSIS — R2681 Unsteadiness on feet: Secondary | ICD-10-CM | POA: Diagnosis not present

## 2020-05-01 DIAGNOSIS — R2689 Other abnormalities of gait and mobility: Secondary | ICD-10-CM | POA: Diagnosis not present

## 2020-05-01 DIAGNOSIS — R419 Unspecified symptoms and signs involving cognitive functions and awareness: Secondary | ICD-10-CM | POA: Diagnosis not present

## 2020-05-01 DIAGNOSIS — R3915 Urgency of urination: Secondary | ICD-10-CM | POA: Diagnosis not present

## 2020-05-06 DIAGNOSIS — R531 Weakness: Secondary | ICD-10-CM | POA: Diagnosis not present

## 2020-05-06 DIAGNOSIS — R2681 Unsteadiness on feet: Secondary | ICD-10-CM | POA: Diagnosis not present

## 2020-05-06 DIAGNOSIS — R419 Unspecified symptoms and signs involving cognitive functions and awareness: Secondary | ICD-10-CM | POA: Diagnosis not present

## 2020-05-06 DIAGNOSIS — R41841 Cognitive communication deficit: Secondary | ICD-10-CM | POA: Diagnosis not present

## 2020-05-06 DIAGNOSIS — R2689 Other abnormalities of gait and mobility: Secondary | ICD-10-CM | POA: Diagnosis not present

## 2020-05-06 DIAGNOSIS — M6281 Muscle weakness (generalized): Secondary | ICD-10-CM | POA: Diagnosis not present

## 2020-05-07 DIAGNOSIS — R2681 Unsteadiness on feet: Secondary | ICD-10-CM | POA: Diagnosis not present

## 2020-05-07 DIAGNOSIS — R41841 Cognitive communication deficit: Secondary | ICD-10-CM | POA: Diagnosis not present

## 2020-05-07 DIAGNOSIS — R531 Weakness: Secondary | ICD-10-CM | POA: Diagnosis not present

## 2020-05-07 DIAGNOSIS — R419 Unspecified symptoms and signs involving cognitive functions and awareness: Secondary | ICD-10-CM | POA: Diagnosis not present

## 2020-05-07 DIAGNOSIS — R2689 Other abnormalities of gait and mobility: Secondary | ICD-10-CM | POA: Diagnosis not present

## 2020-05-07 DIAGNOSIS — M6281 Muscle weakness (generalized): Secondary | ICD-10-CM | POA: Diagnosis not present

## 2020-05-09 DIAGNOSIS — R531 Weakness: Secondary | ICD-10-CM | POA: Diagnosis not present

## 2020-05-09 DIAGNOSIS — R2681 Unsteadiness on feet: Secondary | ICD-10-CM | POA: Diagnosis not present

## 2020-05-09 DIAGNOSIS — M6281 Muscle weakness (generalized): Secondary | ICD-10-CM | POA: Diagnosis not present

## 2020-05-09 DIAGNOSIS — R2689 Other abnormalities of gait and mobility: Secondary | ICD-10-CM | POA: Diagnosis not present

## 2020-05-10 DIAGNOSIS — R2681 Unsteadiness on feet: Secondary | ICD-10-CM | POA: Diagnosis not present

## 2020-05-10 DIAGNOSIS — R2689 Other abnormalities of gait and mobility: Secondary | ICD-10-CM | POA: Diagnosis not present

## 2020-05-10 DIAGNOSIS — M6281 Muscle weakness (generalized): Secondary | ICD-10-CM | POA: Diagnosis not present

## 2020-05-10 DIAGNOSIS — R531 Weakness: Secondary | ICD-10-CM | POA: Diagnosis not present

## 2020-05-13 DIAGNOSIS — R531 Weakness: Secondary | ICD-10-CM | POA: Diagnosis not present

## 2020-05-13 DIAGNOSIS — M6281 Muscle weakness (generalized): Secondary | ICD-10-CM | POA: Diagnosis not present

## 2020-05-13 DIAGNOSIS — R2689 Other abnormalities of gait and mobility: Secondary | ICD-10-CM | POA: Diagnosis not present

## 2020-05-13 DIAGNOSIS — R2681 Unsteadiness on feet: Secondary | ICD-10-CM | POA: Diagnosis not present

## 2020-05-14 DIAGNOSIS — M6281 Muscle weakness (generalized): Secondary | ICD-10-CM | POA: Diagnosis not present

## 2020-05-14 DIAGNOSIS — R2681 Unsteadiness on feet: Secondary | ICD-10-CM | POA: Diagnosis not present

## 2020-05-14 DIAGNOSIS — R531 Weakness: Secondary | ICD-10-CM | POA: Diagnosis not present

## 2020-05-14 DIAGNOSIS — R2689 Other abnormalities of gait and mobility: Secondary | ICD-10-CM | POA: Diagnosis not present

## 2020-05-16 DIAGNOSIS — R2689 Other abnormalities of gait and mobility: Secondary | ICD-10-CM | POA: Diagnosis not present

## 2020-05-16 DIAGNOSIS — M6281 Muscle weakness (generalized): Secondary | ICD-10-CM | POA: Diagnosis not present

## 2020-05-16 DIAGNOSIS — R531 Weakness: Secondary | ICD-10-CM | POA: Diagnosis not present

## 2020-05-16 DIAGNOSIS — R2681 Unsteadiness on feet: Secondary | ICD-10-CM | POA: Diagnosis not present

## 2020-05-20 DIAGNOSIS — R2681 Unsteadiness on feet: Secondary | ICD-10-CM | POA: Diagnosis not present

## 2020-05-20 DIAGNOSIS — R531 Weakness: Secondary | ICD-10-CM | POA: Diagnosis not present

## 2020-05-20 DIAGNOSIS — R2689 Other abnormalities of gait and mobility: Secondary | ICD-10-CM | POA: Diagnosis not present

## 2020-05-20 DIAGNOSIS — M6281 Muscle weakness (generalized): Secondary | ICD-10-CM | POA: Diagnosis not present

## 2020-05-21 NOTE — Progress Notes (Signed)
Cognitive history forms faxed over to [facility]

## 2020-05-27 ENCOUNTER — Telehealth: Payer: Self-pay | Admitting: Neurology

## 2020-05-27 NOTE — Telephone Encounter (Signed)
Cathy from [employer] called in after receiving the Cognitive history forms. She stated there were several places that would state to "see attached", but there was not anything attached.

## 2020-05-28 NOTE — Telephone Encounter (Signed)
Left VM for Cathy with [employer] requesting call back re missing paperwork.

## 2020-05-28 NOTE — Telephone Encounter (Signed)
Faxed neuropsych test scores to [employer] @ [phone] per caller request

## 2020-07-01 DIAGNOSIS — E782 Mixed hyperlipidemia: Secondary | ICD-10-CM | POA: Diagnosis not present

## 2020-07-01 DIAGNOSIS — Z23 Encounter for immunization: Secondary | ICD-10-CM | POA: Diagnosis not present

## 2020-07-01 DIAGNOSIS — G459 Transient cerebral ischemic attack, unspecified: Secondary | ICD-10-CM | POA: Diagnosis not present

## 2020-07-01 DIAGNOSIS — Z0000 Encounter for general adult medical examination without abnormal findings: Secondary | ICD-10-CM | POA: Diagnosis not present

## 2020-07-01 DIAGNOSIS — E1122 Type 2 diabetes mellitus with diabetic chronic kidney disease: Secondary | ICD-10-CM | POA: Diagnosis not present

## 2020-07-01 DIAGNOSIS — R269 Unspecified abnormalities of gait and mobility: Secondary | ICD-10-CM | POA: Diagnosis not present

## 2020-07-01 DIAGNOSIS — Z1389 Encounter for screening for other disorder: Secondary | ICD-10-CM | POA: Diagnosis not present

## 2020-07-06 ENCOUNTER — Encounter (HOSPITAL_COMMUNITY): Payer: Self-pay | Admitting: Emergency Medicine

## 2020-07-06 ENCOUNTER — Emergency Department (HOSPITAL_COMMUNITY)
Admission: EM | Admit: 2020-07-06 | Discharge: 2020-07-08 | Disposition: A | Discharge status: Home / Self Care | Payer: Medicare Other | Attending: Emergency Medicine | Admitting: Emergency Medicine

## 2020-07-06 ENCOUNTER — Other Ambulatory Visit: Payer: Self-pay

## 2020-07-06 ENCOUNTER — Emergency Department (HOSPITAL_COMMUNITY): Payer: Medicare Other

## 2020-07-06 DIAGNOSIS — S0240EA Zygomatic fracture, right side, initial encounter for closed fracture: Secondary | ICD-10-CM | POA: Diagnosis not present

## 2020-07-06 DIAGNOSIS — Y999 Unspecified external cause status: Secondary | ICD-10-CM | POA: Diagnosis not present

## 2020-07-06 DIAGNOSIS — W06XXXA Fall from bed, initial encounter: Secondary | ICD-10-CM | POA: Insufficient documentation

## 2020-07-06 DIAGNOSIS — R52 Pain, unspecified: Secondary | ICD-10-CM | POA: Diagnosis not present

## 2020-07-06 DIAGNOSIS — W19XXXA Unspecified fall, initial encounter: Secondary | ICD-10-CM | POA: Diagnosis not present

## 2020-07-06 DIAGNOSIS — Y939 Activity, unspecified: Secondary | ICD-10-CM | POA: Diagnosis not present

## 2020-07-06 DIAGNOSIS — I10 Essential (primary) hypertension: Secondary | ICD-10-CM | POA: Diagnosis not present

## 2020-07-06 DIAGNOSIS — S0280XA Fracture of other specified skull and facial bones, unspecified side, initial encounter for closed fracture: Secondary | ICD-10-CM

## 2020-07-06 DIAGNOSIS — R2681 Unsteadiness on feet: Secondary | ICD-10-CM | POA: Diagnosis not present

## 2020-07-06 DIAGNOSIS — S02402A Zygomatic fracture, unspecified, initial encounter for closed fracture: Secondary | ICD-10-CM | POA: Insufficient documentation

## 2020-07-06 DIAGNOSIS — Z7984 Long term (current) use of oral hypoglycemic drugs: Secondary | ICD-10-CM | POA: Diagnosis not present

## 2020-07-06 DIAGNOSIS — R262 Difficulty in walking, not elsewhere classified: Secondary | ICD-10-CM | POA: Diagnosis not present

## 2020-07-06 DIAGNOSIS — Z79899 Other long term (current) drug therapy: Secondary | ICD-10-CM | POA: Insufficient documentation

## 2020-07-06 DIAGNOSIS — Y929 Unspecified place or not applicable: Secondary | ICD-10-CM | POA: Diagnosis not present

## 2020-07-06 DIAGNOSIS — S0993XA Unspecified injury of face, initial encounter: Secondary | ICD-10-CM | POA: Diagnosis present

## 2020-07-06 DIAGNOSIS — Z20822 Contact with and (suspected) exposure to covid-19: Secondary | ICD-10-CM | POA: Insufficient documentation

## 2020-07-06 DIAGNOSIS — Z9181 History of falling: Secondary | ICD-10-CM | POA: Diagnosis not present

## 2020-07-06 DIAGNOSIS — S42291A Other displaced fracture of upper end of right humerus, initial encounter for closed fracture: Secondary | ICD-10-CM | POA: Insufficient documentation

## 2020-07-06 DIAGNOSIS — E119 Type 2 diabetes mellitus without complications: Secondary | ICD-10-CM | POA: Diagnosis not present

## 2020-07-06 DIAGNOSIS — R296 Repeated falls: Secondary | ICD-10-CM | POA: Insufficient documentation

## 2020-07-06 DIAGNOSIS — S02401A Maxillary fracture, unspecified, initial encounter for closed fracture: Secondary | ICD-10-CM | POA: Diagnosis not present

## 2020-07-06 DIAGNOSIS — R519 Headache, unspecified: Secondary | ICD-10-CM | POA: Diagnosis not present

## 2020-07-06 DIAGNOSIS — S0230XA Fracture of orbital floor, unspecified side, initial encounter for closed fracture: Secondary | ICD-10-CM

## 2020-07-06 DIAGNOSIS — M79641 Pain in right hand: Secondary | ICD-10-CM | POA: Diagnosis not present

## 2020-07-06 DIAGNOSIS — S6991XA Unspecified injury of right wrist, hand and finger(s), initial encounter: Secondary | ICD-10-CM | POA: Diagnosis not present

## 2020-07-06 DIAGNOSIS — S0231XA Fracture of orbital floor, right side, initial encounter for closed fracture: Secondary | ICD-10-CM | POA: Diagnosis not present

## 2020-07-06 DIAGNOSIS — M25519 Pain in unspecified shoulder: Secondary | ICD-10-CM | POA: Diagnosis not present

## 2020-07-06 DIAGNOSIS — S0990XA Unspecified injury of head, initial encounter: Secondary | ICD-10-CM | POA: Diagnosis not present

## 2020-07-06 DIAGNOSIS — S42201A Unspecified fracture of upper end of right humerus, initial encounter for closed fracture: Secondary | ICD-10-CM | POA: Diagnosis not present

## 2020-07-06 DIAGNOSIS — M542 Cervicalgia: Secondary | ICD-10-CM | POA: Diagnosis not present

## 2020-07-06 MED ORDER — OXYCODONE-ACETAMINOPHEN 5-325 MG PO TABS: 1.0000 | ORAL_TABLET | Freq: Once | ORAL | Status: DC

## 2020-07-06 MED ORDER — FENTANYL CITRATE (PF) 100 MCG/2ML IJ SOLN: 100.0000 ug | Freq: Once | INTRAMUSCULAR | Status: DC

## 2020-07-06 MED ORDER — ATORVASTATIN CALCIUM 40 MG PO TABS
40.0000 mg | ORAL_TABLET | Freq: Every day | ORAL | Status: DC
  Administered 2020-07-07 – 2020-07-08 (×2): 40 mg via ORAL
  Filled 2020-07-06 (×2): qty 1

## 2020-07-06 MED ORDER — RAMIPRIL 2.5 MG PO CAPS
2.5000 mg | ORAL_CAPSULE | Freq: Every day | ORAL | Status: DC
  Administered 2020-07-06 – 2020-07-08 (×3): 2.5 mg via ORAL
  Filled 2020-07-06 (×3): qty 1

## 2020-07-06 MED ORDER — METFORMIN HCL 500 MG PO TABS: 500.0000 mg | ORAL_TABLET | ORAL | Status: DC

## 2020-07-06 MED ORDER — CLOPIDOGREL BISULFATE 75 MG PO TABS
75.0000 mg | ORAL_TABLET | Freq: Every day | ORAL | Status: DC
  Administered 2020-07-06 – 2020-07-08 (×3): 75 mg via ORAL
  Filled 2020-07-06 (×3): qty 1

## 2020-07-06 MED ORDER — OXYCODONE HCL 5 MG PO TABS
5.0000 mg | ORAL_TABLET | Freq: Four times a day (QID) | ORAL | Status: DC | PRN
  Administered 2020-07-08: 5 mg via ORAL
  Filled 2020-07-06 (×2): qty 1

## 2020-07-06 MED ORDER — OXYCODONE HCL 5 MG PO TABS: 5.0000 mg | ORAL_TABLET | ORAL | 0 refills | Status: DC | PRN

## 2020-07-06 MED ORDER — FENTANYL CITRATE (PF) 100 MCG/2ML IJ SOLN
100.0000 ug | Freq: Once | INTRAMUSCULAR | Status: AC
  Administered 2020-07-06: 100 ug via INTRAMUSCULAR
  Filled 2020-07-06: qty 2

## 2020-07-06 MED ORDER — ACETAMINOPHEN 500 MG PO TABS
1000.0000 mg | ORAL_TABLET | Freq: Four times a day (QID) | ORAL | Status: DC | PRN
  Administered 2020-07-07 – 2020-07-08 (×3): 1000 mg via ORAL
  Filled 2020-07-06 (×3): qty 2

## 2020-07-06 MED ORDER — DARIFENACIN HYDROBROMIDE ER 15 MG PO TB24: 15.0000 mg | ORAL_TABLET | Freq: Every day | ORAL | Status: DC

## 2020-07-06 NOTE — NC FL2 (Signed)
[place] MEDICAID FL2 LEVEL OF CARE SCREENING TOOL     IDENTIFICATION  Patient Name: Jaclyn Guzman Birthdate: 06-Jan-1941 Sex: female Admission Date (Current Location): 07/06/2020  [facility] and [place] Number:  Producer, television/film/video and Address:  [facility],  [address], [place] [address]      Provider Number:    Attending Physician Name and Address:  Pricilla Loveless, MD  Relative Name and Phone Number:  Moody Bruins [phone]    Current Level of Care: Hospital Recommended Level of Care: Skilled Nursing Facility Prior Approval Number:    Date Approved/Denied:   PASRR Number: [id number]  Discharge Plan: SNF    Current Diagnoses: Patient Active Problem List   Diagnosis Date Noted  . Closed fracture of zygomatic complex, initial encounter (HCC)   . Nondisplaced fracture of proximal phalanx of right lesser toe(s), initial encounter for closed fracture 08/19/2019  . Urinary incontinence   . Hypertension   . Dyslipidemia   . TIA (transient ischemic attack) 08/18/2019    Orientation RESPIRATION BLADDER Height & Weight     Self, Time, Situation, Place  Normal Continent Weight: 57 kg Height:  5\' 3"  (160 cm)  BEHAVIORAL SYMPTOMS/MOOD NEUROLOGICAL BOWEL NUTRITION STATUS      Continent Diet (Mechanical soft diet per ENT)  AMBULATORY STATUS COMMUNICATION OF NEEDS Skin   Limited Assist (will need platform walker  due to humeral fracture) Verbally Bruising (HUmeral fracture splinted)                       Personal Care Assistance Level of Assistance  Bathing, Dressing     Dressing Assistance: Limited assistance     Functional Limitations Info  Sight (wears glasses) Sight Info: Impaired        SPECIAL CARE FACTORS FREQUENCY  PT (By licensed PT), OT (By licensed OT)     PT Frequency: 5x week OT Frequency: 5x Week            Contractures Contractures Info: Not present    Additional Factors Info  Code Status Code Status  Info: Full Code             Current Medications (07/06/2020):  This is the current hospital active medication list No current facility-administered medications for this encounter.   Current Outpatient Medications  Medication Sig Dispense Refill  . alendronate (FOSAMAX) 70 MG tablet Take 70 mg by mouth once a week. Sundays    . atorvastatin (LIPITOR) 40 MG tablet Take 1 tablet (40 mg total) by mouth daily. 30 tablet 0  . Calcium Carbonate-Vitamin D (CALTRATE 600+D PO) Take 1 tablet by mouth 2 (two) times daily.    . clopidogrel (PLAVIX) 75 MG tablet Take 1 tablet (75 mg total) by mouth daily. 30 tablet 2  . metFORMIN (GLUCOPHAGE) 500 MG tablet Take 500-1,000 mg by mouth See admin instructions. 2 tablets with breakfast and 1 with dinner    . Multiple Vitamins-Minerals (CENTRUM SILVER PO) Take 1 tablet by mouth daily.    02-21-1974 oxyCODONE (ROXICODONE) 5 MG immediate release tablet Take 1 tablet (5 mg total) by mouth every 4 (four) hours as needed for severe pain. 20 tablet 0  . ramipril (ALTACE) 2.5 MG capsule Take 2.5 mg by mouth daily.      . solifenacin (VESICARE) 5 MG tablet Take 10 mg by mouth daily.     Marland Kitchen VITAMIN D PO Take 1 tablet by mouth daily.       Discharge  Medications: Please see discharge summary for a list of discharge medications.  Relevant Imaging Results:  Relevant Lab Results:   Additional Information ssn [id number]  Lockie Pares, RN

## 2020-07-06 NOTE — Consult Note (Signed)
Reason for Consult: Facial fracture Referring Physician: ER attending ([place])  Jaclyn Guzman is an 79 y.o. female status post fall.  She presents complaining of some minimal discomfort over the right side of her face.  She denies malocclusion or visual change.  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  She is joined by her friend who was in the room today during my exam.   Past Medical History:  Diagnosis Date  . Diabetes mellitus   . Dyslipidemia   . Hypertension   . Osteoporosis   . Urinary incontinence     History reviewed. No pertinent surgical history.  Family History  Problem Relation Age of Onset  . Fragile X syndrome Father   . Healthy Sister   . Breast cancer Neg Hx   . Stroke Neg Hx     Social History:  reports that she has never smoked. She has never used smokeless tobacco. She reports that she does not drink alcohol and does not use drugs.  Allergies:  Allergies  Allergen Reactions  . Amoxicillin-Pot Clavulanate Hives  . Meloxicam   . Scallops [Shellfish Allergy] Other (See Comments)    "I don't feel so good after an hour"  . Toviaz [Fesoterodine Fumarate Er]     Medications: I have reviewed the patient's current medications.  No results found for this or any previous visit (from the past 48 hour(s)).  CT Head Wo Contrast  Result Date: 07/06/2020 CLINICAL DATA:  Head and neck pain after a fall. EXAM: CT HEAD WITHOUT CONTRAST CT CERVICAL SPINE WITHOUT CONTRAST TECHNIQUE: Multidetector CT imaging of the head and cervical spine was performed following the standard protocol without intravenous contrast. Multiplanar CT image reconstructions of the cervical spine were also generated. COMPARISON:  CT head dated 08/18/2019 and CT neck dated 08/19/2019 FINDINGS: CT HEAD FINDINGS Brain: No evidence of acute infarction, hemorrhage, hydrocephalus, extra-axial collection or mass lesion/mass effect. There is mild cerebral volume loss with associated ex vacuo  dilatation. Periventricular white matter hypoattenuation likely represents chronic small vessel ischemic disease. Vascular: There are vascular calcifications in the carotid siphons. Skull: There is an apparent cortical discontinuity of the right zygomatic arch which is partially imaged. This was not seen on prior head CT and is favored to represent an acute fracture given the partially imaged mild overlying fat stranding. Sinuses/Orbits: Fluid layers dependently in the right maxillary sinus. Other: None. CT CERVICAL SPINE FINDINGS Alignment: Normal. Skull base and vertebrae: No acute fracture. No primary bone lesion or focal pathologic process. Soft tissues and spinal canal: No prevertebral fluid or swelling. No visible canal hematoma. Disc levels:  Multilevel degenerative disc and joint disease. Upper chest: Negative. Other: A 1.8 cm hypoechoic left thyroid nodule is not significantly changed since 08/19/2019. IMPRESSION: 1. No acute intracranial process. 2. No acute osseous injury in the cervical spine. 3. Cortical discontinuity of the right zygomatic arch is partially imaged. This in combination with fluid layering dependently in the right maxillary sinus raises concern for a zygomaticomaxillary complex fracture and CT face is recommended for further evaluation. 4. Hypoechoic left thyroid nodule is not significantly changed since 08/19/2019. Recommend further evaluation with thyroid ultrasound. (ref: J Am Coll Radiol. 2015 Feb;12(2): 143-50). Electronically Signed   By: Romona Curls M.D.   On: 07/06/2020 12:33   CT Cervical Spine Wo Contrast  Result Date: 07/06/2020 CLINICAL DATA:  Head and neck pain after a fall. EXAM: CT HEAD WITHOUT CONTRAST CT CERVICAL SPINE WITHOUT CONTRAST TECHNIQUE: Multidetector CT imaging  of the head and cervical spine was performed following the standard protocol without intravenous contrast. Multiplanar CT image reconstructions of the cervical spine were also generated.  COMPARISON:  CT head dated 08/18/2019 and CT neck dated 08/19/2019 FINDINGS: CT HEAD FINDINGS Brain: No evidence of acute infarction, hemorrhage, hydrocephalus, extra-axial collection or mass lesion/mass effect. There is mild cerebral volume loss with associated ex vacuo dilatation. Periventricular white matter hypoattenuation likely represents chronic small vessel ischemic disease. Vascular: There are vascular calcifications in the carotid siphons. Skull: There is an apparent cortical discontinuity of the right zygomatic arch which is partially imaged. This was not seen on prior head CT and is favored to represent an acute fracture given the partially imaged mild overlying fat stranding. Sinuses/Orbits: Fluid layers dependently in the right maxillary sinus. Other: None. CT CERVICAL SPINE FINDINGS Alignment: Normal. Skull base and vertebrae: No acute fracture. No primary bone lesion or focal pathologic process. Soft tissues and spinal canal: No prevertebral fluid or swelling. No visible canal hematoma. Disc levels:  Multilevel degenerative disc and joint disease. Upper chest: Negative. Other: A 1.8 cm hypoechoic left thyroid nodule is not significantly changed since 08/19/2019. IMPRESSION: 1. No acute intracranial process. 2. No acute osseous injury in the cervical spine. 3. Cortical discontinuity of the right zygomatic arch is partially imaged. This in combination with fluid layering dependently in the right maxillary sinus raises concern for a zygomaticomaxillary complex fracture and CT face is recommended for further evaluation. 4. Hypoechoic left thyroid nodule is not significantly changed since 08/19/2019. Recommend further evaluation with thyroid ultrasound. (ref: J Am Coll Radiol. 2015 Feb;12(2): 143-50). Electronically Signed   By: Romona Curls M.D.   On: 07/06/2020 12:33   DG Shoulder Right Port  Result Date: 07/06/2020 CLINICAL DATA:  Pt arrived via EMS from [facility], patient was  reaching for something on the floor when she was in bed, rolled out of bed and fell on the floor, unwitnessed fall around 0845. Pt complained of right shoulder pain and right hand pain. EXAM: PORTABLE RIGHT SHOULDER COMPARISON:  None. FINDINGS: There is an impacted fracture at the neck of the proximal humerus. No evidence of dislocation. There are AC joint degenerative changes. Regional soft tissues are unremarkable. IMPRESSION: Impacted fracture of the proximal right humerus. Electronically Signed   By: Emmaline Kluver M.D.   On: 07/06/2020 11:18   DG Hand Complete Right  Result Date: 07/06/2020 CLINICAL DATA:  Pt arrived via EMS from [facility], patient was reaching for something on the floor when she was in bed, rolled out of bed and fell on the floor, unwitnessed fall around 0845. Pt complained of right shoulder pain and right hand pain. EXAM: RIGHT HAND - COMPLETE 3+ VIEW COMPARISON:  None. FINDINGS: There is no evidence of fracture or dislocation. Mild degenerative changes. No erosive changes. Osteopenia. Soft tissues are unremarkable. IMPRESSION: Negative. Electronically Signed   By: Emmaline Kluver M.D.   On: 07/06/2020 11:20   CT Maxillofacial Wo Contrast  Result Date: 07/06/2020 CLINICAL DATA:  Facial trauma with pain EXAM: CT MAXILLOFACIAL WITHOUT CONTRAST TECHNIQUE: Multidetector CT imaging of the maxillofacial structures was performed. Multiplanar CT image reconstructions were also generated. COMPARISON:  Same day CT head FINDINGS: Osseous: There is a right-sided zygomaticomaxillary complex fracture which involves fractures of the zygomatic arch, inferior orbital rim, anterior and posterior walls of the right maxillary sinus, and diastasis of the suture between the zygomatic bone and greater wing of sphenoid within right  lateral orbital wall (series 4, images 21-28). Orbits: The globes are intact.  No intraorbital gas or hematoma. Sinuses: Fluid layering in the right  maxillary sinus likely represents blood products. Soft tissues: There is soft tissue swelling overlying the right face. Limited intracranial: No significant or unexpected finding. IMPRESSION: Right-sided zygomaticomaxillary complex fracture as above. Electronically Signed   By: Romona Curls M.D.   On: 07/06/2020 14:18    Review of Systems Blood pressure 131/77, pulse 88, temperature 97.6 F (36.4 C), temperature source Oral, resp. rate 16, height 5\' 3"  (1.6 m), weight 57 kg, SpO2 100 %. Physical Exam  Alert and oriented x3 in no acute distress Pupils equal round and reactive to light.  Extraocular movements intact.  Gaze conjugate.  Visual acuity grossly normal.  No pain with eye movement No malocclusion.  No bony step-offs involving facial bones.  Midface stable.  TMJ nontender Neck without trauma or mass.  Trachea midline Cranial nerves II through XII intact.  No focal motor or sensory deficits noted Chest symmetric expansions bilaterally without use of accessory muscles Nasal cavity without epistaxis or mass.  No clear rhinorrhea. Normal tongue mobility.  Normal lips.  No evidence of posterior rhinorrhea.  Patient denies a salty taste.  CT scan - I reviewed the films and report dictated by the radiologist.  The patient has a minimally displaced right ZMC fracture without a concomitant orbital floor fracture.  The zygoma itself is nondisplaced.  The most displaced aspect of the fracture involves the right lateral buttress.  Medial buttresses intact.  Mandible is unremarkable.   Assessment/Plan:  Right zygomatic complex fracture  This patient has a nondisplaced right ZMC fracture without an associated orbital floor fracture.  She has no malocclusion, trismus, or pain with eye movement.  She denies diplopia or blurred vision.  Both the patient and her friend who joins her in the room during my visit deny any significant cosmetic deformity.  I would not recommend surgical fixation for this  patient given the nature of her fracture, the absence of symptoms, her age, and her use of platelet inhibitor medications for TIAs.  I would treat this conservatively with a soft mechanical diet x4 weeks.  She should follow-up with her primary care physician and/or an oral maxillofacial surgeon at that time.  She should call sooner should she develop any of the above-mentioned symptoms.  07/06/2020, 3:12 PM

## 2020-07-06 NOTE — Evaluation (Signed)
Physical Therapy Evaluation Patient Details Name: Jaclyn Guzman MRN: [id number] DOB: 1941-04-14 Today's Date: 07/06/2020   History of Present Illness  79 yo female admitted with R shoulder fx and facial fx after falling at ALF. Hx of DM, frequent falls  Clinical Impression  On eval in ED, pt required Mod assist for bed mobility and Min assist +2 for safety/equipment for ambulation. Pt presents with general weakness, decreased activity tolerance, and impaired gait and balance. Pt remains at high risk for more falls. Pt is from ALF where she required very little assistance for mobility/ADLs. Pt currently requires a higher level of care. Recommend ST SNF for continued rehab.     Follow Up Recommendations SNF    Equipment Recommendations   (TBD at next venue)    Recommendations for Other Services       Precautions / Restrictions Precautions Precautions: Fall Required Braces or Orthoses: Sling (R UE) Restrictions Weight Bearing Restrictions: Yes RUE Weight Bearing: Non weight bearing      Mobility  Bed Mobility Overal bed mobility: Needs Assistance Bed Mobility: Supine to Sit;Sit to Supine     Supine to sit: Mod assist;HOB elevated Sit to supine: Mod assist;HOB elevated   General bed mobility comments: Assist for trunk and LEs. Assist to scoot to EOB. Increased time. Pt relied on stretcher rail  Transfers Overall transfer level: Needs assistance Equipment used: Hemi-walker Transfers: Sit to/from Stand Sit to Stand: Min assist         General transfer comment: Assist to rise, stabilize, control descent. Very unsteady and high risk for further falls.  Ambulation/Gait Ambulation/Gait assistance: Min assist;+2 safety/equipment Gait Distance (Feet): 4 Feet Assistive device: Hemi-walker Gait Pattern/deviations: Step-to pattern;Wide base of support     General Gait Details: Assist to stabilize pt throughout short distance. Very unsteady and high risk for further falls.  Pt was eventually able to take 5 steps forwards then 5 steps backwards.  Stairs            Wheelchair Mobility    Modified Rankin (Stroke Patients Only)       Balance Overall balance assessment: History of Falls;Needs assistance         Standing balance support: Single extremity supported Standing balance-Leahy Scale: Poor                               Pertinent Vitals/Pain Pain Assessment: Faces Pain Score: 2  Faces Pain Scale: Hurts even more Pain Location: R UE Pain Descriptors / Indicators: Discomfort;Aching;Sore;Grimacing Pain Intervention(s): Monitored during session;Limited activity within patient's tolerance;Repositioned    Home Living Family/patient expects to be discharged to:: Private residence   Available Help at Discharge: Available PRN/intermittently Type of Home: Assisted living ([facility]) Home Access: Level entry     Home Layout: One level Home Equipment: Environmental consultant - 4 wheels;Cane - single point      Prior Function Level of Independence: Needs assistance   Gait / Transfers Assistance Needed: uses rollator for ambulation  ADL's / Homemaking Assistance Needed: assistance for meds and showers 2x/week. Pt was able to get dressed on her own.  Comments: uses rollator for ambulation. assistance for meds, showers 2x/week.     Hand Dominance        Extremity/Trunk Assessment   Upper Extremity Assessment Upper Extremity Assessment: RUE deficits/detail RUE: Unable to fully assess due to immobilization    Lower Extremity Assessment Lower Extremity Assessment: Generalized weakness  Cervical / Trunk Assessment Cervical / Trunk Assessment: Normal  Communication   Communication: No difficulties  Cognition Arousal/Alertness: Awake/alert Behavior During Therapy: WFL for tasks assessed/performed Overall Cognitive Status: Within Functional Limits for tasks assessed                                         General Comments      Exercises     Assessment/Plan    PT Assessment Patient needs continued PT services  PT Problem List Decreased strength;Decreased mobility;Decreased activity tolerance;Decreased balance;Decreased range of motion;Decreased knowledge of use of DME;Pain;Decreased safety awareness;Decreased knowledge of precautions       PT Treatment Interventions DME instruction;Gait training;Therapeutic activities;Therapeutic exercise;Patient/family education;Balance training;Functional mobility training    PT Goals (Current goals can be found in the Care Plan section)  Acute Rehab PT Goals Patient Stated Goal: less pain. regain PLOF PT Goal Formulation: With patient/family Time For Goal Achievement: 07/20/20 Potential to Achieve Goals: Good    Frequency Min 2X/week   Barriers to discharge Decreased caregiver support      Co-evaluation               AM-PAC PT "6 Clicks" Mobility  Outcome Measure Help needed turning from your back to your side while in a flat bed without using bedrails?: A Little Help needed moving from lying on your back to sitting on the side of a flat bed without using bedrails?: A Lot Help needed moving to and from a bed to a chair (including a wheelchair)?: A Little Help needed standing up from a chair using your arms (e.g., wheelchair or bedside chair)?: A Little Help needed to walk in hospital room?: A Lot Help needed climbing 3-5 steps with a railing? : A Lot 6 Click Score: 15    End of Session Equipment Utilized During Treatment: Gait belt Activity Tolerance: Patient tolerated treatment well Patient left: in bed;with call bell/phone within reach;with family/visitor present   PT Visit Diagnosis: Difficulty in walking, not elsewhere classified (R26.2);Pain;History of falling (Z91.81);Repeated falls (R29.6);Unsteadiness on feet (R26.81) Pain - Right/Left: Right Pain - part of body: Shoulder    Time: 3875-6433 PT Time Calculation (min)  (ACUTE ONLY): 23 min   Charges:   PT Evaluation $PT Eval Low Complexity: 1 Low PT Treatments $Gait Training: 8-22 mins          Faye Ramsay, PT Acute Rehabilitation  Office: [phone] Pager: [phone]

## 2020-07-06 NOTE — ED Provider Notes (Signed)
[facility]-EMERGENCY DEPT Provider Note   CSN: [id number] Arrival date & time: 07/06/20  [id number]     History Chief Complaint  Patient presents with  . Fall  . Shoulder Pain    Jaclyn Guzman is a 79 y.o. female.  HPI      79 year old female with a history of diabetes, dyslipidemia, hypertension, cognitive dysfunction who presents with concern for right shoulder pain after a fall out of bed.  Reports that she was reaching for her sock, rolled too far, falling out of bed onto her right shoulder.  She denies loss of consciousness or hitting her head.  Denies headache, neck pain, numbness, weakness, chest pain, abdominal pain, hip pain.  Reports the pain in the right shoulder is about a 5 out of 10, however with movement becomes a 9 out of 10.   Past Medical History:  Diagnosis Date  . Diabetes mellitus   . Dyslipidemia   . Hypertension   . Osteoporosis   . Urinary incontinence     Patient Active Problem List   Diagnosis Date Noted  . Closed fracture of zygomatic complex, initial encounter (HCC)   . Nondisplaced fracture of proximal phalanx of right lesser toe(s), initial encounter for closed fracture 08/19/2019  . Urinary incontinence   . Hypertension   . Dyslipidemia   . TIA (transient ischemic attack) 08/18/2019    History reviewed. No pertinent surgical history.   OB History   No obstetric history on file.     Family History  Problem Relation Age of Onset  . Fragile X syndrome Father   . Healthy Sister   . Breast cancer Neg Hx   . Stroke Neg Hx     Social History   Tobacco Use  . Smoking status: Never Smoker  . Smokeless tobacco: Never Used  Vaping Use  . Vaping Use: Never used  Substance Use Topics  . Alcohol use: No  . Drug use: No    Home Medications Prior to Admission medications   Medication Sig Start Date End Date Taking? Authorizing Provider  alendronate (FOSAMAX) 70 MG tablet Take 70 mg by mouth once a week. Sundays  07/18/19  Yes [provider]  atorvastatin (LIPITOR) 40 MG tablet Take 1 tablet (40 mg total) by mouth daily. 08/21/19  Yes Joseph ArtVann, Jessica U, DO  Calcium Carbonate-Vitamin D (CALTRATE 600+D PO) Take 1 tablet by mouth 2 (two) times daily.   Yes [provider]  clopidogrel (PLAVIX) 75 MG tablet Take 1 tablet (75 mg total) by mouth daily. 08/20/19 08/19/20 Yes Vann, Jessica U, DO  metFORMIN (GLUCOPHAGE) 500 MG tablet Take 500 mg by mouth 2 (two) times daily with a meal.    Yes [provider]  Multiple Vitamins-Minerals (CENTRUM SILVER PO) Take 1 tablet by mouth daily.   Yes [provider]  ramipril (ALTACE) 2.5 MG capsule Take 2.5 mg by mouth daily.     Yes [provider]  VITAMIN D PO Take 1 tablet by mouth daily.   Yes [provider]  oxyCODONE (ROXICODONE) 5 MG immediate release tablet Take 1 tablet (5 mg total) by mouth every 4 (four) hours as needed for severe pain. 07/06/20   Alvira MondaySchlossman, Enijah Furr, MD    Allergies    Amoxicillin-pot clavulanate, Meloxicam, Scallops [shellfish allergy], and Toviaz [fesoterodine fumarate er]  Review of Systems   Review of Systems  Constitutional: Negative for fever.  HENT: Negative for sore throat.   Eyes: Negative for visual disturbance.  Respiratory: Negative for cough and shortness of breath.   Cardiovascular: Negative for chest pain.  Gastrointestinal: Negative for abdominal pain, nausea and vomiting.  Genitourinary: Negative for difficulty urinating.  Musculoskeletal: Positive for arthralgias. Negative for back pain and neck pain.  Skin: Negative for rash.  Neurological: Negative for syncope, weakness, numbness and headaches.    Physical Exam Updated Vital Signs BP 133/73 (BP Location: Right Arm)   Pulse 70   Temp 98.7 F (37.1 C) (Oral)   Resp 16   Ht 5\' 3"  (1.6 m)   Wt 57 kg   SpO2 97%   BMI 22.26 kg/m   Physical Exam Vitals and nursing note reviewed.  Constitutional:      General:  She is not in acute distress.    Appearance: She is well-developed. She is not diaphoretic.  HENT:     Head: Normocephalic and atraumatic.  Eyes:     Conjunctiva/sclera: Conjunctivae normal.  Cardiovascular:     Rate and Rhythm: Normal rate and regular rhythm.  Pulmonary:     Effort: Pulmonary effort is normal. No respiratory distress.     Breath sounds: Normal breath sounds.  Chest:     Chest wall: No tenderness.  Abdominal:     General: There is no distension.     Palpations: Abdomen is soft.     Tenderness: There is no abdominal tenderness. There is no guarding.  Musculoskeletal:        General: Tenderness present. Deformity: right shoulder, normal radial pulse, wrist extension/finger abduction, opponens, tenderness right shoulder and decreased ROM.     Cervical back: Normal range of motion. No tenderness.  Skin:    General: Skin is warm and dry.     Findings: No erythema or rash.  Neurological:     Mental Status: She is alert and oriented to person, place, and time.     ED Results / Procedures / Treatments   Labs (all labs ordered are listed, but only abnormal results are displayed) Labs Reviewed - No data to display  EKG None  Radiology CT Head Wo Contrast  Result Date: 07/06/2020 CLINICAL DATA:  Head and neck pain after a fall. EXAM: CT HEAD WITHOUT CONTRAST CT CERVICAL SPINE WITHOUT CONTRAST TECHNIQUE: Multidetector CT imaging of the head and cervical spine was performed following the standard protocol without intravenous contrast. Multiplanar CT image reconstructions of the cervical spine were also generated. COMPARISON:  CT head dated 08/18/2019 and CT neck dated 08/19/2019 FINDINGS: CT HEAD FINDINGS Brain: No evidence of acute infarction, hemorrhage, hydrocephalus, extra-axial collection or mass lesion/mass effect. There is mild cerebral volume loss with associated ex vacuo dilatation. Periventricular white matter hypoattenuation likely represents chronic small vessel  ischemic disease. Vascular: There are vascular calcifications in the carotid siphons. Skull: There is an apparent cortical discontinuity of the right zygomatic arch which is partially imaged. This was not seen on prior head CT and is favored to represent an acute fracture given the partially imaged mild overlying fat stranding. Sinuses/Orbits: Fluid layers dependently in the right maxillary sinus. Other: None. CT CERVICAL SPINE FINDINGS Alignment: Normal. Skull base and vertebrae: No acute fracture. No primary bone lesion or focal pathologic process. Soft tissues and spinal canal: No prevertebral fluid or swelling. No visible canal hematoma. Disc levels:  Multilevel degenerative disc and joint disease. Upper chest: Negative. Other: A 1.8 cm hypoechoic left thyroid nodule is not significantly changed since 08/19/2019. IMPRESSION: 1. No acute intracranial process. 2. No acute osseous injury in the cervical  spine. 3. Cortical discontinuity of the right zygomatic arch is partially imaged. This in combination with fluid layering dependently in the right maxillary sinus raises concern for a zygomaticomaxillary complex fracture and CT face is recommended for further evaluation. 4. Hypoechoic left thyroid nodule is not significantly changed since 08/19/2019. Recommend further evaluation with thyroid ultrasound. (ref: J Am Coll Radiol. 2015 Feb;12(2): 143-50). Electronically Signed   By: Romona Curls M.D.   On: 07/06/2020 12:33   CT Cervical Spine Wo Contrast  Result Date: 07/06/2020 CLINICAL DATA:  Head and neck pain after a fall. EXAM: CT HEAD WITHOUT CONTRAST CT CERVICAL SPINE WITHOUT CONTRAST TECHNIQUE: Multidetector CT imaging of the head and cervical spine was performed following the standard protocol without intravenous contrast. Multiplanar CT image reconstructions of the cervical spine were also generated. COMPARISON:  CT head dated 08/18/2019 and CT neck dated 08/19/2019 FINDINGS: CT HEAD FINDINGS Brain: No  evidence of acute infarction, hemorrhage, hydrocephalus, extra-axial collection or mass lesion/mass effect. There is mild cerebral volume loss with associated ex vacuo dilatation. Periventricular white matter hypoattenuation likely represents chronic small vessel ischemic disease. Vascular: There are vascular calcifications in the carotid siphons. Skull: There is an apparent cortical discontinuity of the right zygomatic arch which is partially imaged. This was not seen on prior head CT and is favored to represent an acute fracture given the partially imaged mild overlying fat stranding. Sinuses/Orbits: Fluid layers dependently in the right maxillary sinus. Other: None. CT CERVICAL SPINE FINDINGS Alignment: Normal. Skull base and vertebrae: No acute fracture. No primary bone lesion or focal pathologic process. Soft tissues and spinal canal: No prevertebral fluid or swelling. No visible canal hematoma. Disc levels:  Multilevel degenerative disc and joint disease. Upper chest: Negative. Other: A 1.8 cm hypoechoic left thyroid nodule is not significantly changed since 08/19/2019. IMPRESSION: 1. No acute intracranial process. 2. No acute osseous injury in the cervical spine. 3. Cortical discontinuity of the right zygomatic arch is partially imaged. This in combination with fluid layering dependently in the right maxillary sinus raises concern for a zygomaticomaxillary complex fracture and CT face is recommended for further evaluation. 4. Hypoechoic left thyroid nodule is not significantly changed since 08/19/2019. Recommend further evaluation with thyroid ultrasound. (ref: J Am Coll Radiol. 2015 Feb;12(2): 143-50). Electronically Signed   By: Romona Curls M.D.   On: 07/06/2020 12:33   DG Shoulder Right Port  Result Date: 07/06/2020 CLINICAL DATA:  Pt arrived via EMS from [facility], patient was reaching for something on the floor when she was in bed, rolled out of bed and fell on the floor,  unwitnessed fall around 0845. Pt complained of right shoulder pain and right hand pain. EXAM: PORTABLE RIGHT SHOULDER COMPARISON:  None. FINDINGS: There is an impacted fracture at the neck of the proximal humerus. No evidence of dislocation. There are AC joint degenerative changes. Regional soft tissues are unremarkable. IMPRESSION: Impacted fracture of the proximal right humerus. Electronically Signed   By: Emmaline Kluver M.D.   On: 07/06/2020 11:18   DG Hand Complete Right  Result Date: 07/06/2020 CLINICAL DATA:  Pt arrived via EMS from [facility], patient was reaching for something on the floor when she was in bed, rolled out of bed and fell on the floor, unwitnessed fall around 0845. Pt complained of right shoulder pain and right hand pain. EXAM: RIGHT HAND - COMPLETE 3+ VIEW COMPARISON:  None. FINDINGS: There is no evidence of fracture or dislocation. Mild degenerative changes.  No erosive changes. Osteopenia. Soft tissues are unremarkable. IMPRESSION: Negative. Electronically Signed   By: Emmaline Kluver M.D.   On: 07/06/2020 11:20   CT Maxillofacial Wo Contrast  Result Date: 07/06/2020 CLINICAL DATA:  Facial trauma with pain EXAM: CT MAXILLOFACIAL WITHOUT CONTRAST TECHNIQUE: Multidetector CT imaging of the maxillofacial structures was performed. Multiplanar CT image reconstructions were also generated. COMPARISON:  Same day CT head FINDINGS: Osseous: There is a right-sided zygomaticomaxillary complex fracture which involves fractures of the zygomatic arch, inferior orbital rim, anterior and posterior walls of the right maxillary sinus, and diastasis of the suture between the zygomatic bone and greater wing of sphenoid within right lateral orbital wall (series 4, images 21-28). Orbits: The globes are intact.  No intraorbital gas or hematoma. Sinuses: Fluid layering in the right maxillary sinus likely represents blood products. Soft tissues: There is soft tissue swelling overlying  the right face. Limited intracranial: No significant or unexpected finding. IMPRESSION: Right-sided zygomaticomaxillary complex fracture as above. Electronically Signed   By: Romona Curls M.D.   On: 07/06/2020 14:18    Procedures Procedures (including critical care time)  Medications Ordered in ED Medications  atorvastatin (LIPITOR) tablet 40 mg (has no administration in time range)  clopidogrel (PLAVIX) tablet 75 mg (75 mg Oral Given 07/06/20 2238)  metFORMIN (GLUCOPHAGE) tablet 500-1,000 mg (has no administration in time range)  ramipril (ALTACE) capsule 2.5 mg (2.5 mg Oral Given 07/06/20 2239)  acetaminophen (TYLENOL) tablet 1,000 mg (1,000 mg Oral Given 07/07/20 0027)  oxyCODONE (Oxy IR/ROXICODONE) immediate release tablet 5 mg (has no administration in time range)  fentaNYL (SUBLIMAZE) injection 100 mcg (100 mcg Intramuscular Given 07/06/20 1219)    ED Course  I have reviewed the triage vital signs and the nursing notes.  Pertinent labs & imaging results that were available during my care of the patient were reviewed by me and considered in my medical decision making (see chart for details).    MDM Rules/Calculators/A&P                          79 year old female with a history of diabetes, dyslipidemia, hypertension, cognitive dysfunction who presents with concern for right shoulder pain after a fall out of bed.   XR with proximal humerus fracture. NV intact and closed fx. Placed in sling and will refer to Orthopedics for further care. Given rx for oxycodone and discussed risks of this medication and reviewed in [place] drug database.    CT head/CSpine/face with right zygomaxillary complex fx. No trismus, no malocclusion, no problems with EOM.  Discussed with Dr. Elijah Birk who came to bedside for evaluation, recommends soft diet for 4 weeks.    Pt will be unable to ambulate normally with her roller walker given presence of humerus fracture.  [facility] has been [facility] and she can be moved to SNF portion for rehab. Contacted TOC team for FL2.     Final Clinical Impression(s) / ED Diagnoses Final diagnoses:  Fall  Other closed displaced fracture of proximal end of right humerus, initial encounter  Closed fracture of right zygomatic arch, initial encounter (HCC)    Rx / DC Orders ED Discharge Orders         Ordered    oxyCODONE (ROXICODONE) 5 MG immediate release tablet  Every 4 hours PRN        07/06/20 1409           Alvira Monday, MD 07/08/20 [phone]

## 2020-07-06 NOTE — Progress Notes (Signed)
Orthopedic Tech Progress Note Patient Details:  Jaclyn Guzman 1941-01-24 [id number]  Ortho Devices Type of Ortho Device: Sling and swathe Ortho Device/Splint Location: RUE Ortho Device/Splint Interventions: Ordered, Application   Post Interventions Patient Tolerated: Well Instructions Provided: Care of device   Jennye Moccasin 07/06/2020, 12:32 PM

## 2020-07-06 NOTE — ED Notes (Signed)
Ortho tech called for sling and swathe placement.

## 2020-07-06 NOTE — TOC Initial Note (Signed)
Transition of Care [facility]) - Initial/Assessment Note    Patient Details  Name: Jaclyn Guzman MRN: [id number] Date of Birth: 20-Sep-1941  [facility] CM/SW Contact:    Lockie Pares, RN Phone Number: 07/06/2020, 4:36 PM  Clinical Narrative:                  Patient will need to go to SNF to recoup from her fall.        Patient Goals and CMS Choice        Expected Discharge Plan and Services                                                Prior Living Arrangements/Services                       Activities of Daily Living      Permission Sought/Granted                  Emotional Assessment              Admission diagnosis:  fall Patient Active Problem List   Diagnosis Date Noted  . Closed fracture of zygomatic complex, initial encounter (HCC)   . Nondisplaced fracture of proximal phalanx of right lesser toe(s), initial encounter for closed fracture 08/19/2019  . Urinary incontinence   . Hypertension   . Dyslipidemia   . TIA (transient ischemic attack) 08/18/2019   PCP:  Georgann Housekeeper, MD Pharmacy:   [facility] [phone] [place], [place] - [phone] AT [facility] OF [facility] [address] [place] [address] Phone: [phone] Fax: [phone]     Social Determinants of Health (SDOH) Interventions    Readmission Risk Interventions No flowsheet data found.

## 2020-07-06 NOTE — Discharge Instructions (Addendum)
You will need to use a platform walker at your Skilled Nursing Facility  Recommend soft diet for 4 weeks for the facial fracture.  Take Tylenol (acetaminophen) 1000 mg 4 times a day for 1 week. This is the maximum dose of Tylenol usually take from all sources. Please check other over-the-counter medications and prescriptions to ensure you are not taking other medications that contain acetaminophen.  Take oxycodone as needed for breakthrough pain.  This medication can be addicting, sedating and cause constipation.    With your shoulder/right humerus injury, Jaclyn Bible will be unable to use her right arm with her walker and unable to lift.

## 2020-07-06 NOTE — ED Triage Notes (Addendum)
Arrives via EMS from [facility], patient was reaching for something on the floor when she was in bed, rolled out of bed and fell on the floor, unwitnessed fall around 0845. Ems found her supine on the ground. No blood thinners. Complains of R shoulder pain, briusing noted to R eyebrow and minor abrasions to R knee. Alert and oriented, normally ambulatory with a walker.

## 2020-07-07 MED ORDER — METFORMIN HCL 500 MG PO TABS
500.0000 mg | ORAL_TABLET | Freq: Two times a day (BID) | ORAL | Status: DC
  Administered 2020-07-07 – 2020-07-08 (×4): 500 mg via ORAL
  Filled 2020-07-07 (×4): qty 1

## 2020-07-07 NOTE — ED Notes (Signed)
Assumed care of pt at this time. Pt sleeping in stretcher, no s/sx of acute distress at this time.  

## 2020-07-07 NOTE — Progress Notes (Signed)
CSW attempted to reach admissions at [facility] without success - CSW spoke with Alycia Rossetti, Diplomatic Services operational officer at the facility who states no admissions staff are working this wekeend.  Edwin Dada, MSW, LCSW-A Transitions of Care  Clinical Social Worker  [facility] Emergency Departments  Medical ICU [phone]

## 2020-07-08 DIAGNOSIS — R262 Difficulty in walking, not elsewhere classified: Secondary | ICD-10-CM | POA: Diagnosis not present

## 2020-07-08 DIAGNOSIS — R279 Unspecified lack of coordination: Secondary | ICD-10-CM | POA: Diagnosis not present

## 2020-07-08 DIAGNOSIS — Z743 Need for continuous supervision: Secondary | ICD-10-CM | POA: Diagnosis not present

## 2020-07-08 LAB — SARS CORONAVIRUS 2 (TAT 6-24 HRS): SARS Coronavirus 2: NEGATIVE

## 2020-07-08 MED ORDER — OXYCODONE HCL 5 MG PO TABS: 5.0000 mg | ORAL_TABLET | ORAL | 0 refills | Status: DC | PRN

## 2020-07-08 NOTE — Progress Notes (Signed)
TOC CSW obtained authorization from [place].  Authorization #:  [id number]  Start Date:  07/08/2020  Review Date:  07/10/2020  Contact Person:  Cristine Sagar  [place], MSW, LCSW-A                  [facility] ED Transitions of Care Clinical Social Worker [email or web address] [phone]

## 2020-07-08 NOTE — Progress Notes (Signed)
TOC CSW spoke with Raiford Noble Crenshaw/pts son [phone].  Pts son wanted an update on pts status on being transferred.    CSW will continue to follow for dc needs.  Kylar Speelman Tarpley-Carter, MSW, LCSW-A                  [facility] ED Transitions of CareClinical Social Worker [email or web address] [phone]

## 2020-07-08 NOTE — Progress Notes (Signed)
CSW received a call from [place] at [facility] who confirmed that the patient has been offered a bed and has been accepted and that the pt can arrive on 07/08/20 ASAP.  The pt's accepting doctor is Dr. Chales Abrahams.  The room number will be 28 and in the [facility] skilled nursing unit.  The number for report is [phone] nursing office extension [phone].   RN, please insure that PTAR is clearly aware that pt is going to the "[facility]ALTH[facility] skilled nursing unit", specifically.   CSW will update RN and EDP.   Dorothe Pea. Aum Caggiano, Theresia Majors, LCAS Clinical Social Worker Ph: [phone]

## 2020-07-08 NOTE — Progress Notes (Signed)
TOC CSW has spoken with [facility]/[place] [phone].    Room #:  28 in Healthcare  Call report #:  [phone] ext. [phone]  Pts AVS has been faxed.  CSW will continue to follow for dc needs.  Anyssa Sharpless Tarpley-Carter, MSW, LCSW-A                  [facility] ED Transitions of CareClinical Social Worker [email or web address] [phone]

## 2020-07-08 NOTE — ED Notes (Signed)
Full lien change, peri-care, applied barrier cream to red areas. Changed brief

## 2020-07-08 NOTE — ED Notes (Signed)
PTAR called, transportation arranged. °

## 2020-07-09 ENCOUNTER — Non-Acute Institutional Stay (SKILLED_NURSING_FACILITY): Payer: Medicare Other | Admitting: Nurse Practitioner

## 2020-07-09 ENCOUNTER — Encounter: Payer: Self-pay | Admitting: Nurse Practitioner

## 2020-07-09 DIAGNOSIS — S02402A Zygomatic fracture, unspecified, initial encounter for closed fracture: Secondary | ICD-10-CM | POA: Diagnosis not present

## 2020-07-09 DIAGNOSIS — E1149 Type 2 diabetes mellitus with other diabetic neurological complication: Secondary | ICD-10-CM

## 2020-07-09 DIAGNOSIS — I10 Essential (primary) hypertension: Secondary | ICD-10-CM

## 2020-07-09 DIAGNOSIS — S42294D Other nondisplaced fracture of upper end of right humerus, subsequent encounter for fracture with routine healing: Secondary | ICD-10-CM | POA: Diagnosis not present

## 2020-07-09 DIAGNOSIS — R270 Ataxia, unspecified: Secondary | ICD-10-CM

## 2020-07-09 DIAGNOSIS — E119 Type 2 diabetes mellitus without complications: Secondary | ICD-10-CM | POA: Insufficient documentation

## 2020-07-09 DIAGNOSIS — S42201A Unspecified fracture of upper end of right humerus, initial encounter for closed fracture: Secondary | ICD-10-CM | POA: Insufficient documentation

## 2020-07-09 DIAGNOSIS — S02401A Maxillary fracture, unspecified, initial encounter for closed fracture: Secondary | ICD-10-CM

## 2020-07-09 DIAGNOSIS — M810 Age-related osteoporosis without current pathological fracture: Secondary | ICD-10-CM

## 2020-07-09 DIAGNOSIS — R419 Unspecified symptoms and signs involving cognitive functions and awareness: Secondary | ICD-10-CM | POA: Insufficient documentation

## 2020-07-09 DIAGNOSIS — S0230XA Fracture of orbital floor, unspecified side, initial encounter for closed fracture: Secondary | ICD-10-CM

## 2020-07-09 DIAGNOSIS — S0280XA Fracture of other specified skull and facial bones, unspecified side, initial encounter for closed fracture: Secondary | ICD-10-CM

## 2020-07-09 NOTE — Assessment & Plan Note (Signed)
Traumatic fx of the right zygomatic and proximal end of humerus, continue Alendronate, Ca, Vit D

## 2020-07-09 NOTE — Assessment & Plan Note (Signed)
Continue Metformin, update Hgb a1c

## 2020-07-09 NOTE — Assessment & Plan Note (Signed)
Blood pressure is controlled, continue Ramipril.  

## 2020-07-09 NOTE — Progress Notes (Signed)
Location:    [facility] Room Number: 28 Place of Service:  SNF (31) Provider:  Stacy Sailer, ManXie NP  Wenda Low, MD  Patient Care Team: Wenda Low, MD as PCP - General (Internal Medicine) [name], DO as Consulting Physician (Neurology)  Extended Emergency Contact Information Primary Emergency Contact: [name],[name] Address: [address]          [place] [address] [place] Phone: [phone] Relation: Sister Secondary Emergency Contact: [place] Mobile Phone: [phone] Relation: Brother  Code Status:  [place] of care: Advanced Directive information Advanced Directives 07/06/2020  Does Patient Have a Medical Advance Directive? No  Type of Advance Directive -     Chief Complaint  Patient presents with  . Acute Visit    Medication review    HPI:  Pt is a 79 y.o. female seen today for an acute visit for review medication  ED eval 07/06/20 for fall, resulted in close displaced fx of proximal end of right humerus, closed fracture of right zygomatic arch, CT head, maxillofacial, cervical spine, X-ray R hand, shoulder done in ED. Sling/swath, Tylenol, Oxycodone for pain. SN[facility] to receive therapy, her goal if to return [facility] when able  OP, takes Alendronate, Vit D, Ca  T2DM, takes Metformin  HTN, takes Ramipril  Tremor/Ataxia syndrome, frequent falls, last saw Neurology 03/22/20, MRI showed pathologic atrophy cerebellum  Nerocoognitive dysfunction/dementia, affecting frontal subcortical abilities.    Past Medical History:  Diagnosis Date  . Diabetes mellitus   . Dyslipidemia   . Hypertension   . Osteoporosis   . Urinary incontinence    History reviewed. No pertinent surgical history.  Allergies  Allergen Reactions  . Amoxicillin-Pot Clavulanate Hives  . Meloxicam     Unknown reaction  . Scallops [Shellfish Allergy] Other (See Comments)    "I don't feel so good after an hour"  . Toviaz  [Fesoterodine Fumarate Er]     Unknown reaction    Allergies as of 07/09/2020      Reactions   Amoxicillin-pot Clavulanate Hives   Meloxicam    Unknown reaction   Scallops [shellfish Allergy] Other (See Comments)   "I don't feel so good after an hour"   Toviaz [fesoterodine Fumarate Er]    Unknown reaction      Medication List       Accurate as of July 09, 2020 11:59 PM. If you have any questions, ask your nurse or doctor.        alendronate 70 MG tablet Commonly known as: FOSAMAX Take 70 mg by mouth once a week. Sundays   atorvastatin 40 MG tablet Commonly known as: LIPITOR Take 1 tablet (40 mg total) by mouth daily.   CALTRATE 600+D PO Take 1 tablet by mouth 2 (two) times daily.   CENTRUM SILVER PO Take 1 tablet by mouth daily.   clopidogrel 75 MG tablet Commonly known as: Plavix Take 1 tablet (75 mg total) by mouth daily.   metFORMIN 500 MG tablet Commonly known as: GLUCOPHAGE Take 500 mg by mouth 2 (two) times daily with a meal.   oxyCODONE 5 MG immediate release tablet Commonly known as: Roxicodone Take 1 tablet (5 mg total) by mouth every 4 (four) hours as needed for severe pain.   ramipril 2.5 MG capsule Commonly known as: ALTACE Take 2.5 mg by mouth daily.   VITAMIN D PO Take 1 tablet by mouth daily. 1,000 units   zinc oxide 20 % ointment  Apply 1 application topically as needed for irritation. To buttocks after every incontinent episode and as needed for redness. May keep at bedside.       Review of Systems  Constitutional: Negative for activity change, appetite change and fever.  HENT: Negative for congestion, trouble swallowing and voice change.   Eyes: Negative for visual disturbance.  Respiratory: Negative for cough, shortness of breath and wheezing.   Cardiovascular: Negative for chest pain, palpitations and leg swelling.  Gastrointestinal: Negative for abdominal pain, constipation, nausea and vomiting.  Genitourinary: Positive for  frequency. Negative for difficulty urinating.  Musculoskeletal: Positive for arthralgias, gait problem and joint swelling.  Skin: Positive for color change.  Neurological: Positive for tremors. Negative for facial asymmetry, speech difficulty, weakness and headaches.  Psychiatric/Behavioral: Negative for agitation, behavioral problems, hallucinations and sleep disturbance. The patient is not nervous/anxious.     Immunization History  Administered Date(s) Administered  . Influenza Inj Mdck Quad Pf 08/10/2019   Pertinent  Health Maintenance Due  Topic Date Due  . FOOT EXAM  Never done  . OPHTHALMOLOGY EXAM  Never done  . PNA vac Low Risk Adult (1 of 2 - PCV13) Never done  . HEMOGLOBIN A1C  02/17/2020  . INFLUENZA VACCINE  06/09/2020  . DEXA SCAN  Completed   Fall Risk  03/05/2020 09/06/2019  Falls in the past year? 1 1  Number falls in past yr: 0 0  Injury with Fall? 1 1   Functional Status Survey:    Vitals:   07/09/20 1010  BP: 127/75  Pulse: 85  Resp: 19  Temp: 98.1 F (36.7 C)  SpO2: 95%  Weight: 125 lb 9.6 oz (57 kg)  Height: 5' 3"  (1.6 m)   Body mass index is 22.25 kg/m. Physical Exam Vitals and nursing note reviewed.  Constitutional:      General: She is not in acute distress.    Appearance: Normal appearance. She is not ill-appearing, toxic-appearing or diaphoretic.  HENT:     Head: Normocephalic.     Comments: Right face bruise, Zygomatic arch fx    Nose: Nose normal.     Mouth/Throat:     Mouth: Mucous membranes are moist.  Eyes:     Extraocular Movements: Extraocular movements intact.     Conjunctiva/sclera: Conjunctivae normal.     Pupils: Pupils are equal, round, and reactive to light.  Cardiovascular:     Rate and Rhythm: Normal rate and regular rhythm.     Heart sounds: No murmur heard.   Pulmonary:     Effort: Pulmonary effort is normal.     Breath sounds: No wheezing, rhonchi or rales.  Chest:     Chest wall: No tenderness.  Abdominal:      General: Bowel sounds are normal.     Palpations: Abdomen is soft.     Tenderness: There is no abdominal tenderness. There is no right CVA tenderness, left CVA tenderness, guarding or rebound.  Musculoskeletal:        General: Tenderness present.     Cervical back: Normal range of motion and neck supple.     Right lower leg: No edema.     Left lower leg: No edema.     Comments: Right shoulder pain, in sling  Skin:    General: Skin is warm and dry.     Findings: Bruising present.     Comments: Right face, shoulder bruise.   Neurological:     General: No focal deficit present.  Mental Status: She is alert and oriented to person, place, and time. Mental status is at baseline.     Motor: No weakness.     Coordination: Coordination abnormal.     Gait: Gait abnormal.  Psychiatric:        Mood and Affect: Mood normal.        Behavior: Behavior normal.        Thought Content: Thought content normal.     Labs reviewed: Recent Labs    08/18/19 1715  NA 136  K 4.0  CL 101  CO2 24  GLUCOSE 103*  BUN 19  CREATININE 0.84  CALCIUM 9.6   Recent Labs    08/18/19 1715  AST 18  ALT 18  ALKPHOS 41  BILITOT 0.7  PROT 6.7  ALBUMIN 4.2   Recent Labs    08/18/19 1715  WBC 6.5  NEUTROABS 3.8  HGB 13.1  HCT 39.4  MCV 95.9  PLT 226   Lab Results  Component Value Date   TSH 1.010 08/19/2019   Lab Results  Component Value Date   HGBA1C 6.2 (H) 08/19/2019   Lab Results  Component Value Date   CHOL 145 08/20/2019   HDL 32 (L) 08/20/2019   LDLCALC 86 08/20/2019   TRIG 133 08/20/2019   CHOLHDL 4.5 08/20/2019    Significant Diagnostic Results in last 30 days:  CT Head Wo Contrast  Result Date: 07/06/2020 CLINICAL DATA:  Head and neck pain after a fall. EXAM: CT HEAD WITHOUT CONTRAST CT CERVICAL SPINE WITHOUT CONTRAST TECHNIQUE: Multidetector CT imaging of the head and cervical spine was performed following the standard protocol without intravenous contrast.  Multiplanar CT image reconstructions of the cervical spine were also generated. COMPARISON:  CT head dated 08/18/2019 and CT neck dated 08/19/2019 FINDINGS: CT HEAD FINDINGS Brain: No evidence of acute infarction, hemorrhage, hydrocephalus, extra-axial collection or mass lesion/mass effect. There is mild cerebral volume loss with associated ex vacuo dilatation. Periventricular white matter hypoattenuation likely represents chronic small vessel ischemic disease. Vascular: There are vascular calcifications in the carotid siphons. Skull: There is an apparent cortical discontinuity of the right zygomatic arch which is partially imaged. This was not seen on prior head CT and is favored to represent an acute fracture given the partially imaged mild overlying fat stranding. Sinuses/Orbits: Fluid layers dependently in the right maxillary sinus. Other: None. CT CERVICAL SPINE FINDINGS Alignment: Normal. Skull base and vertebrae: No acute fracture. No primary bone lesion or focal pathologic process. Soft tissues and spinal canal: No prevertebral fluid or swelling. No visible canal hematoma. Disc levels:  Multilevel degenerative disc and joint disease. Upper chest: Negative. Other: A 1.8 cm hypoechoic left thyroid nodule is not significantly changed since 08/19/2019. IMPRESSION: 1. No acute intracranial process. 2. No acute osseous injury in the cervical spine. 3. Cortical discontinuity of the right zygomatic arch is partially imaged. This in combination with fluid layering dependently in the right maxillary sinus raises concern for a zygomaticomaxillary complex fracture and CT face is recommended for further evaluation. 4. Hypoechoic left thyroid nodule is not significantly changed since 08/19/2019. Recommend further evaluation with thyroid ultrasound. (ref: J Am Coll Radiol. 2015 Feb;12(2): 143-50). Electronically Signed   By: Zerita Boers M.D.   On: 07/06/2020 12:33   CT Cervical Spine Wo Contrast  Result Date:  07/06/2020 CLINICAL DATA:  Head and neck pain after a fall. EXAM: CT HEAD WITHOUT CONTRAST CT CERVICAL SPINE WITHOUT CONTRAST TECHNIQUE: Multidetector CT imaging of the head and  cervical spine was performed following the standard protocol without intravenous contrast. Multiplanar CT image reconstructions of the cervical spine were also generated. COMPARISON:  CT head dated 08/18/2019 and CT neck dated 08/19/2019 FINDINGS: CT HEAD FINDINGS Brain: No evidence of acute infarction, hemorrhage, hydrocephalus, extra-axial collection or mass lesion/mass effect. There is mild cerebral volume loss with associated ex vacuo dilatation. Periventricular white matter hypoattenuation likely represents chronic small vessel ischemic disease. Vascular: There are vascular calcifications in the carotid siphons. Skull: There is an apparent cortical discontinuity of the right zygomatic arch which is partially imaged. This was not seen on prior head CT and is favored to represent an acute fracture given the partially imaged mild overlying fat stranding. Sinuses/Orbits: Fluid layers dependently in the right maxillary sinus. Other: None. CT CERVICAL SPINE FINDINGS Alignment: Normal. Skull base and vertebrae: No acute fracture. No primary bone lesion or focal pathologic process. Soft tissues and spinal canal: No prevertebral fluid or swelling. No visible canal hematoma. Disc levels:  Multilevel degenerative disc and joint disease. Upper chest: Negative. Other: A 1.8 cm hypoechoic left thyroid nodule is not significantly changed since 08/19/2019. IMPRESSION: 1. No acute intracranial process. 2. No acute osseous injury in the cervical spine. 3. Cortical discontinuity of the right zygomatic arch is partially imaged. This in combination with fluid layering dependently in the right maxillary sinus raises concern for a zygomaticomaxillary complex fracture and CT face is recommended for further evaluation. 4. Hypoechoic left thyroid nodule is not  significantly changed since 08/19/2019. Recommend further evaluation with thyroid ultrasound. (ref: J Am Coll Radiol. 2015 Feb;12(2): 143-50). Electronically Signed   By: Zerita Boers M.D.   On: 07/06/2020 12:33   DG Shoulder Right Port  Result Date: 07/06/2020 CLINICAL DATA:  Pt arrived via EMS from [facility], patient was reaching for something on the floor when she was in bed, rolled out of bed and fell on the floor, unwitnessed fall around 0845. Pt complained of right shoulder pain and right hand pain. EXAM: PORTABLE RIGHT SHOULDER COMPARISON:  None. FINDINGS: There is an impacted fracture at the neck of the proximal humerus. No evidence of dislocation. There are AC joint degenerative changes. Regional soft tissues are unremarkable. IMPRESSION: Impacted fracture of the proximal right humerus. Electronically Signed   By: Audie Pinto M.D.   On: 07/06/2020 11:18   DG Hand Complete Right  Result Date: 07/06/2020 CLINICAL DATA:  Pt arrived via EMS from [facility], patient was reaching for something on the floor when she was in bed, rolled out of bed and fell on the floor, unwitnessed fall around 0845. Pt complained of right shoulder pain and right hand pain. EXAM: RIGHT HAND - COMPLETE 3+ VIEW COMPARISON:  None. FINDINGS: There is no evidence of fracture or dislocation. Mild degenerative changes. No erosive changes. Osteopenia. Soft tissues are unremarkable. IMPRESSION: Negative. Electronically Signed   By: Audie Pinto M.D.   On: 07/06/2020 11:20   CT Maxillofacial Wo Contrast  Result Date: 07/06/2020 CLINICAL DATA:  Facial trauma with pain EXAM: CT MAXILLOFACIAL WITHOUT CONTRAST TECHNIQUE: Multidetector CT imaging of the maxillofacial structures was performed. Multiplanar CT image reconstructions were also generated. COMPARISON:  Same day CT head FINDINGS: Osseous: There is a right-sided zygomaticomaxillary complex fracture which involves fractures of the  zygomatic arch, inferior orbital rim, anterior and posterior walls of the right maxillary sinus, and diastasis of the suture between the zygomatic bone and greater wing of sphenoid within right lateral orbital wall (series  4, images 21-28). Orbits: The globes are intact.  No intraorbital gas or hematoma. Sinuses: Fluid layering in the right maxillary sinus likely represents blood products. Soft tissues: There is soft tissue swelling overlying the right face. Limited intracranial: No significant or unexpected finding. IMPRESSION: Right-sided zygomaticomaxillary complex fracture as above. Electronically Signed   By: Zerita Boers M.D.   On: 07/06/2020 14:18    Assessment/Plan Closed fracture of right proximal humerus ED eval 07/06/20 for fall, resulted in close displaced fx of proximal end of right humerus, closed fracture of right zygomatic arch, CT head, maxillofacial, cervical spine, X-ray R hand, shoulder done in ED. Sling/swath, Tylenol, Oxycodone for pain. SN[facility] to receive therapy, her goal if to return [facility] when able Update CBC/diff, CMP/eGFR, TSH  Closed fracture of zygomatic complex, initial encounter ([place]) Continue to observe. Delay ENT  Hypertension Blood pressure is controlled, continue Ramipril.   T2DM (type 2 diabetes mellitus) ([place]) Continue Metformin, update Hgb a1c  Osteoporosis Traumatic fx of the right zygomatic and proximal end of humerus, continue Alendronate, Ca, Vit D  Neurocognitive disorder Nerocoognitive dysfunction/dementia, affecting frontal subcortical abilities. F/u neurology   Ataxia Tremor/Ataxia syndrome, frequent falls, last saw Neurology 03/22/20, MRI showed pathologic atrophy cerebellum      Family/ staff Communication: plan of care reviewed with the patient and charge nurse.   Labs/tests ordered:  none  Time spend 35 minutes.

## 2020-07-09 NOTE — Assessment & Plan Note (Addendum)
Continue to observe. Delay ENT

## 2020-07-09 NOTE — Assessment & Plan Note (Signed)
ED eval 07/06/20 for fall, resulted in close displaced fx of proximal end of right humerus, closed fracture of right zygomatic arch, CT head, maxillofacial, cervical spine, X-ray R hand, shoulder done in ED. Sling/swath, Tylenol, Oxycodone for pain. SN[facility] to receive therapy, her goal if to return [facility] when able Update CBC/diff, CMP/eGFR, TSH

## 2020-07-09 NOTE — Assessment & Plan Note (Signed)
Nerocoognitive dysfunction/dementia, affecting frontal subcortical abilities. F/u neurology

## 2020-07-09 NOTE — Assessment & Plan Note (Signed)
Tremor/Ataxia syndrome, frequent falls, last saw Neurology 03/22/20, MRI showed pathologic atrophy cerebellum

## 2020-07-10 ENCOUNTER — Other Ambulatory Visit: Payer: Self-pay

## 2020-07-10 ENCOUNTER — Encounter: Payer: Self-pay | Admitting: Nurse Practitioner

## 2020-07-10 ENCOUNTER — Emergency Department (HOSPITAL_COMMUNITY)
Admission: EM | Admit: 2020-07-10 | Discharge: 2020-07-10 | Disposition: A | Discharge status: Home / Self Care | Payer: Medicare Other | Attending: Emergency Medicine | Admitting: Emergency Medicine

## 2020-07-10 DIAGNOSIS — Z794 Long term (current) use of insulin: Secondary | ICD-10-CM | POA: Insufficient documentation

## 2020-07-10 DIAGNOSIS — I951 Orthostatic hypotension: Secondary | ICD-10-CM | POA: Insufficient documentation

## 2020-07-10 DIAGNOSIS — Z743 Need for continuous supervision: Secondary | ICD-10-CM | POA: Diagnosis not present

## 2020-07-10 DIAGNOSIS — W19XXXA Unspecified fall, initial encounter: Secondary | ICD-10-CM | POA: Insufficient documentation

## 2020-07-10 DIAGNOSIS — Z79899 Other long term (current) drug therapy: Secondary | ICD-10-CM | POA: Insufficient documentation

## 2020-07-10 DIAGNOSIS — I10 Essential (primary) hypertension: Secondary | ICD-10-CM | POA: Diagnosis not present

## 2020-07-10 DIAGNOSIS — Y929 Unspecified place or not applicable: Secondary | ICD-10-CM | POA: Insufficient documentation

## 2020-07-10 DIAGNOSIS — I959 Hypotension, unspecified: Secondary | ICD-10-CM | POA: Diagnosis not present

## 2020-07-10 DIAGNOSIS — R5381 Other malaise: Secondary | ICD-10-CM

## 2020-07-10 DIAGNOSIS — Z723 Lack of physical exercise: Secondary | ICD-10-CM | POA: Diagnosis not present

## 2020-07-10 DIAGNOSIS — E119 Type 2 diabetes mellitus without complications: Secondary | ICD-10-CM | POA: Insufficient documentation

## 2020-07-10 DIAGNOSIS — S069X9A Unspecified intracranial injury with loss of consciousness of unspecified duration, initial encounter: Secondary | ICD-10-CM | POA: Diagnosis present

## 2020-07-10 DIAGNOSIS — R55 Syncope and collapse: Secondary | ICD-10-CM | POA: Diagnosis not present

## 2020-07-10 DIAGNOSIS — R410 Disorientation, unspecified: Secondary | ICD-10-CM | POA: Diagnosis not present

## 2020-07-10 DIAGNOSIS — R0902 Hypoxemia: Secondary | ICD-10-CM | POA: Diagnosis not present

## 2020-07-10 DIAGNOSIS — R279 Unspecified lack of coordination: Secondary | ICD-10-CM | POA: Diagnosis not present

## 2020-07-10 DIAGNOSIS — Y9389 Activity, other specified: Secondary | ICD-10-CM | POA: Insufficient documentation

## 2020-07-10 DIAGNOSIS — Y999 Unspecified external cause status: Secondary | ICD-10-CM | POA: Insufficient documentation

## 2020-07-10 LAB — BASIC METABOLIC PANEL
Anion gap: 14 (ref 5–15)
BUN: 21 mg/dL (ref 8–23)
CO2: 22 mmol/L (ref 22–32)
Calcium: 9.3 mg/dL (ref 8.9–10.3)
Chloride: 100 mmol/L (ref 98–111)
Creatinine, Ser: 0.94 mg/dL (ref 0.44–1.00)
GFR calc Af Amer: >60 mL/min (ref >60)
GFR calc non Af Amer: 59 mL/min — ABNORMAL LOW (ref >60)
Glucose, Bld: 187 mg/dL — ABNORMAL HIGH (ref 70–99)
Potassium: 4.2 mmol/L (ref 3.5–5.1)
Sodium: 136 mmol/L (ref 135–145)

## 2020-07-10 LAB — CBC WITH DIFFERENTIAL/PLATELET
Abs Immature Granulocytes: 0.05 10*3/uL (ref 0.00–0.07)
Basophils Absolute: 0 10*3/uL (ref 0.0–0.1)
Basophils Relative: 0 %
Eosinophils Absolute: 0.1 10*3/uL (ref 0.0–0.5)
Eosinophils Relative: 1 %
HCT: 37.4 % (ref 36.0–46.0)
Hemoglobin: 12.5 g/dL (ref 12.0–15.0)
Immature Granulocytes: 1 %
Lymphocytes Relative: 12 %
Lymphs Abs: 1.2 10*3/uL (ref 0.7–4.0)
MCH: 31.2 pg (ref 26.0–34.0)
MCHC: 33.4 g/dL (ref 30.0–36.0)
MCV: 93.3 fL (ref 80.0–100.0)
Monocytes Absolute: 0.7 10*3/uL (ref 0.1–1.0)
Monocytes Relative: 7 %
Neutro Abs: 8.1 10*3/uL — ABNORMAL HIGH (ref 1.7–7.7)
Neutrophils Relative %: 79 %
Platelets: 251 10*3/uL (ref 150–400)
RBC: 4.01 MIL/uL (ref 3.87–5.11)
RDW: 13 % (ref 11.5–15.5)
WBC: 10.2 10*3/uL (ref 4.0–10.5)
nRBC: 0 % (ref 0.0–0.2)

## 2020-07-10 MED ORDER — SODIUM CHLORIDE 0.9 % IV BOLUS
1000.0000 mL | Freq: Once | INTRAVENOUS | Status: AC
  Administered 2020-07-10: 1000 mL via INTRAVENOUS

## 2020-07-10 NOTE — ED Triage Notes (Signed)
79 yo female from [facility] (SNF). Pt is there for shoulder rehab. This morning pt was having physical therapy and was witnessed having 3 episodes of syncope lasting about 10 seconds each time. No fall or trauma. No associated symptoms. Pt was hypotensive 90/60 HR 72 at facility.  FSBS=203. Received 500 ml NS en route by EMS. BP improved.   #20 left hand

## 2020-07-10 NOTE — ED Provider Notes (Signed)
[facility]-EMERGENCY DEPT Provider Note   CSN: [id number] Arrival date & time: 07/10/20  1026     History Chief Complaint  Patient presents with   Loss of Consciousness    Jaclyn Guzman is a 79 y.o. female.  The history is provided by the patient.  Loss of Consciousness Most recent episode:  Today Progression:  Resolved Chronicity:  New Context: standing up   Witnessed: yes   Relieved by:  Bed rest Worsened by:  Nothing Associated symptoms: recent fall   Associated symptoms: no anxiety, no chest pain, no confusion, no diaphoresis, no difficulty breathing, no dizziness, no fever, no focal sensory loss, no focal weakness, no headaches, no malaise/fatigue, no nausea, no palpitations, no recent surgery, no rectal bleeding, no seizures, no shortness of breath, no visual change and no vomiting        Past Medical History:  Diagnosis Date   Diabetes mellitus    Dyslipidemia    Hypertension    Osteoporosis    Urinary incontinence     Patient Active Problem List   Diagnosis Date Noted   Closed fracture of right proximal humerus 07/09/2020   T2DM (type 2 diabetes mellitus) (HCC) 07/09/2020   Osteoporosis 07/09/2020   Neurocognitive disorder 07/09/2020   Ataxia 07/09/2020   Closed fracture of zygomatic complex, initial encounter (HCC)    Nondisplaced fracture of proximal phalanx of right lesser toe(s), initial encounter for closed fracture 08/19/2019   Urinary incontinence    Hypertension    Dyslipidemia    TIA (transient ischemic attack) 08/18/2019    No past surgical history on file.   OB History   No obstetric history on file.     Family History  Problem Relation Age of Onset   Fragile X syndrome Father    Healthy Sister    Breast cancer Neg Hx    Stroke Neg Hx     Social History   Tobacco Use   Smoking status: Never Smoker   Smokeless tobacco: Never Used  Vaping Use   Vaping Use: Never used  Substance  Use Topics   Alcohol use: No   Drug use: No    Home Medications Prior to Admission medications   Medication Sig Start Date End Date Taking? Authorizing Provider  alendronate (FOSAMAX) 70 MG tablet Take 70 mg by mouth once a week. Sundays 07/18/19   [provider]  atorvastatin (LIPITOR) 40 MG tablet Take 1 tablet (40 mg total) by mouth daily. 08/21/19   Joseph Art, DO  Calcium Carbonate-Vitamin D (CALTRATE 600+D PO) Take 1 tablet by mouth 2 (two) times daily.    [provider]  clopidogrel (PLAVIX) 75 MG tablet Take 1 tablet (75 mg total) by mouth daily. 08/20/19 08/19/20  Joseph Art, DO  metFORMIN (GLUCOPHAGE) 500 MG tablet Take 500 mg by mouth 2 (two) times daily with a meal.     [provider]  Multiple Vitamins-Minerals (CENTRUM SILVER PO) Take 1 tablet by mouth daily.    [provider]  oxyCODONE (ROXICODONE) 5 MG immediate release tablet Take 1 tablet (5 mg total) by mouth every 4 (four) hours as needed for severe pain. 07/08/20   Terrilee Files, MD  ramipril (ALTACE) 2.5 MG capsule Take 2.5 mg by mouth daily.      [provider]  VITAMIN D PO Take 1 tablet by mouth daily. 1,000 units    [provider]  zinc oxide 20 % ointment Apply 1 application  topically as needed for irritation. To buttocks after every incontinent episode and as needed for redness. May keep at bedside.    [provider]    Allergies    Amoxicillin-pot clavulanate, Meloxicam, Scallops [shellfish allergy], and Toviaz [fesoterodine fumarate er]  Review of Systems   Review of Systems  Constitutional: Negative for chills, diaphoresis, fever and malaise/fatigue.  HENT: Negative for ear pain and sore throat.   Eyes: Negative for pain and visual disturbance.  Respiratory: Negative for cough and shortness of breath.   Cardiovascular: Positive for syncope. Negative for chest pain and palpitations.  Gastrointestinal: Negative for abdominal  pain, nausea and vomiting.  Genitourinary: Negative for dysuria and hematuria.  Musculoskeletal: Negative for arthralgias and back pain.  Skin: Negative for color change and rash.  Neurological: Positive for syncope (stood up to work with PT and got lightheaded and fell back into bed). Negative for dizziness, focal weakness, seizures and headaches.  Psychiatric/Behavioral: Negative for confusion.  All other systems reviewed and are negative.   Physical Exam Updated Vital Signs  ED Triage Vitals  Enc Vitals Group     BP 07/10/20 1048 (!) 126/57     Pulse Rate 07/10/20 1048 80     Resp 07/10/20 1048 16     Temp 07/10/20 1048 98.3 F (36.8 C)     Temp Source 07/10/20 1048 Oral     SpO2 07/10/20 1048 100 %     Weight 07/10/20 1050 134 lb (60.8 kg)     Height 07/10/20 1050 5\' 3"  (1.6 m)     Head Circumference --      Peak Flow --      Pain Score 07/10/20 1049 3     Pain Loc --      Pain Edu? --      Excl. in GC? --     Physical Exam Vitals and nursing note reviewed.  Constitutional:      General: She is not in acute distress.    Appearance: She is well-developed. She is not ill-appearing.  HENT:     Head: Normocephalic and atraumatic.     Nose: Nose normal.     Mouth/Throat:     Mouth: Mucous membranes are moist.  Eyes:     Extraocular Movements: Extraocular movements intact.     Conjunctiva/sclera: Conjunctivae normal.     Pupils: Pupils are equal, round, and reactive to light.  Cardiovascular:     Rate and Rhythm: Normal rate and regular rhythm.     Pulses: Normal pulses.     Heart sounds: Normal heart sounds. No murmur heard.   Pulmonary:     Effort: Pulmonary effort is normal. No respiratory distress.     Breath sounds: Normal breath sounds.  Abdominal:     Palpations: Abdomen is soft.     Tenderness: There is no abdominal tenderness.  Musculoskeletal:     Cervical back: Normal range of motion and neck supple.  Skin:    General: Skin is warm and dry.      Capillary Refill: Capillary refill takes less than 2 seconds.  Neurological:     General: No focal deficit present.     Mental Status: She is alert and oriented to person, place, and time.     Cranial Nerves: No cranial nerve deficit.     Sensory: No sensory deficit.     Motor: No weakness.     Coordination: Coordination normal.     Comments: 5+ out of 5 strength throughout,  normal sensation, patient able to stand with minimal symptoms  Psychiatric:        Mood and Affect: Mood normal.     ED Results / Procedures / Treatments   Labs (all labs ordered are listed, but only abnormal results are displayed) Labs Reviewed  CBC WITH DIFFERENTIAL/PLATELET - Abnormal; Notable for the following components:      Result Value   Neutro Abs 8.1 (*)    All other components within normal limits  BASIC METABOLIC PANEL - Abnormal; Notable for the following components:   Glucose, Bld 187 (*)    GFR calc non Af Amer 59 (*)    All other components within normal limits    EKG EKG Interpretation  Date/Time:  Wednesday July 10 2020 11:35:29 EDT Ventricular Rate:  83 PR Interval:    QRS Duration: 100 QT Interval:  373 QTC Calculation: 439 R Axis:   60 Text Interpretation: Sinus rhythm Borderline prolonged PR interval Low voltage, precordial leads Confirmed by Virgina Norfolk [phone]) on 07/10/2020 11:40:09 AM   Radiology No results found.  Procedures Procedures (including critical care time)  Medications Ordered in ED Medications  sodium chloride 0.9 % bolus 1,000 mL (1,000 mLs Intravenous New Bag/Given 07/10/20 1102)    ED Course  I have reviewed the triage vital signs and the nursing notes.  Pertinent labs & imaging results that were available during my care of the patient were reviewed by me and considered in my medical decision making (see chart for details).    MDM Rules/Calculators/A&P                          JENEFER WOERNER is a 79 year old female with history of diabetes,  hypertension who presents to the ED after syncopal episodes.  Normal vitals.  No fever.  Patient just transferred to skilled nursing facility last night after breaking her humerus.  Has spent the last several days in the emergency department waiting for facility.  She has not had much to eat or drink.  She woke up this morning and physical therapist tried to work with her but every time she stood up she was too weak and would fall back into bed.  Possibly lost consciousness with that but did not hit her head.  Event was witnessed.  Patient overall asymptomatic here.  EMS states that blood pressure was in the 90s when they first got there but blood pressure is stabilized after some IV fluids with EMS.  Suspect likely orthostatic/deconditioning.  She is not having any chest pain or shortness of breath.  She was able to stand in the room with me with some feeling of lightheadedness but mostly felt like her legs are weak. Suspect that from recent fall and having some deconditioning causing that.  Will give IV fluids and check basic labs.  Lab work with no significant anemia, electrolyte abnormality, kidney injury.  EKG with sinus rhythm.  Blood pressure is stabilized following IV fluids.  Discharged back to facility for continued physical therapy as patient previously condition.  Suspect vasovagal/orthostatic event today.  This chart was dictated using voice recognition software.  Despite best efforts to proofread,  errors can occur which can change the documentation meaning.    Final Clinical Impression(s) / ED Diagnoses Final diagnoses:  Orthostatic syncope  Physical deconditioning    Rx / DC Orders ED Discharge Orders    None       Virgina Norfolk, DO 07/10/20 1141

## 2020-07-10 NOTE — Discharge Instructions (Addendum)
Continue physical therapy.  Maintain good nutrition.  Take your time when changing positions from sitting to standing.

## 2020-07-10 NOTE — ED Notes (Signed)
[facility] called and transportation arranged 

## 2020-07-10 NOTE — ED Notes (Signed)
Attempted to call report to the facility.

## 2020-07-11 ENCOUNTER — Encounter: Payer: Self-pay | Admitting: Internal Medicine

## 2020-07-11 ENCOUNTER — Encounter: Payer: Self-pay | Admitting: Neurology

## 2020-07-11 ENCOUNTER — Non-Acute Institutional Stay (SKILLED_NURSING_FACILITY): Payer: Medicare Other | Admitting: Internal Medicine

## 2020-07-11 DIAGNOSIS — I10 Essential (primary) hypertension: Secondary | ICD-10-CM | POA: Diagnosis not present

## 2020-07-11 DIAGNOSIS — R419 Unspecified symptoms and signs involving cognitive functions and awareness: Secondary | ICD-10-CM | POA: Diagnosis not present

## 2020-07-11 DIAGNOSIS — E1149 Type 2 diabetes mellitus with other diabetic neurological complication: Secondary | ICD-10-CM

## 2020-07-11 DIAGNOSIS — R270 Ataxia, unspecified: Secondary | ICD-10-CM

## 2020-07-11 DIAGNOSIS — M810 Age-related osteoporosis without current pathological fracture: Secondary | ICD-10-CM | POA: Diagnosis not present

## 2020-07-11 DIAGNOSIS — S42294D Other nondisplaced fracture of upper end of right humerus, subsequent encounter for fracture with routine healing: Secondary | ICD-10-CM

## 2020-07-11 LAB — TSH: TSH: 0.96 (ref 0.41–5.90)

## 2020-07-11 LAB — HEPATIC FUNCTION PANEL
ALT: 10 (ref 7–35)
AST: 14 (ref 13–35)
Alkaline Phosphatase: 53 (ref 25–125)
Bilirubin, Total: 0.8

## 2020-07-11 LAB — CBC AND DIFFERENTIAL
HCT: 36 (ref 36–46)
Hemoglobin: 12.1 (ref 12.0–16.0)
Neutrophils Absolute: 3725
Platelets: 257 (ref 150–399)
WBC: 6.4

## 2020-07-11 LAB — BASIC METABOLIC PANEL
BUN: 21 (ref 4–21)
CO2: 23 — AB (ref 13–22)
Chloride: 104 (ref 99–108)
Creatinine: 1 (ref 0.5–1.1)
Glucose: 161
Potassium: 4.2 (ref 3.4–5.3)
Sodium: 138 (ref 137–147)

## 2020-07-11 LAB — COMPREHENSIVE METABOLIC PANEL
Albumin: 3.5 (ref 3.5–5.0)
Calcium: 9.1 (ref 8.7–10.7)
Globulin: 2.3

## 2020-07-11 LAB — CBC: RBC: 3.87 (ref 3.87–5.11)

## 2020-07-11 LAB — HEMOGLOBIN A1C: Hemoglobin A1C: 6.9

## 2020-07-11 NOTE — Telephone Encounter (Signed)
Letter is completed and ready to be printed from her chart for my signature.

## 2020-07-11 NOTE — Progress Notes (Signed)
Provider:  Einar Crow, MD Location:   [facility] Room Number: 28 Place of Service:  SNF (31)  PCP: Georgann Housekeeper, MD Patient Care Team: Georgann Housekeeper, MD as PCP - General (Internal Medicine) [name], DO as Consulting Physician (Neurology)  Extended Emergency Contact Information Primary Emergency Contact: [name],[name] Address: [address]          [place] [address] [place] Home Phone: [phone] Relation: Sister Secondary Emergency Contact: Pless,Rick Mobile Phone: [phone] Relation: Brother  Code Status: DNR Goals of Care: Advanced Directive information Advanced Directives 07/11/2020  Does Patient Have a Medical Advance Directive? Yes  Type of Advance Directive Living will;Out of facility DNR (pink MOST or yellow form)  Does patient want to make changes to medical advance directive? No - Patient declined  Would patient like information on creating a medical advance directive? -  Pre-existing out of facility DNR order (yellow form or pink MOST form) Pink MOST form placed in chart (order not valid for inpatient use)      Chief Complaint  Patient presents with  . New Admit To SNF    New admission   . Best Practice Recommendations    Hep C Screening, Tetanus/Tdap, COVID-19 vaccine and Flu vaccine  . Quality Metric Gaps    Foot exam and HgA1c    HPI: Patient is a 79 y.o. 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  Patient has been living in AL and had been doing well when she had a unwitnessed fall.  She sustained closed displaced fracture of proximal end of right humerus needing Soft Cast Was admitted now in SNF as she is unable to do her ADLs. Yesterday patient had an episode of syncope and was sent to the ED where she was diagnosed with dehydration.  She was  given IV fluids and sent back to the facility. Patient did not have any acute complaints today.  She wanted to know when she can go back to her assisted living.  She said her pain is controlled with the medications that she is getting.  She is working with therapy and has not had any more dizziness.  Denies any cough shortness of breath dysuria.  Past Medical History:  Diagnosis Date  . Diabetes mellitus   . Dyslipidemia   . Hypertension   . Osteoporosis   . Urinary incontinence    No past surgical history on file.  reports that she has never smoked. She has never used smokeless tobacco. She reports that she does not drink alcohol and does not use drugs. Social History   Socioeconomic History  . Marital status: Single    Spouse name: Not on file  . Number of children: 0  . Years of education: Not on file  . Highest education level: Master's degree (e.g., MA, MS, MEng, MEd, MSW, MBA)  Occupational History  . Occupation: retired  Tobacco Use  . Smoking status: Never Smoker  . Smokeless tobacco: Never Used  Vaping Use  . Vaping Use: Never used  Substance and Sexual Activity  . Alcohol use: No  . Drug use: No  . Sexual activity: Not on file  Other Topics Concern  . Not on file  Social History Narrative   Social Review      Patient lives at home alone   Pt lives in  a one or two story home?one story home   Private home   What is patient highest level of education?Masters Degree   Is patient RIGHT or LEFT handed? RIGHT HANDED   Does patient drink coffee, tea, soda? YES    How much coffee, tea, soda does patient drink? SODA only at lunch, sometimes 1-2 times a week   Does patient exercise regularly? NO   Social Determinants of Health   Financial Resource Strain:   . Difficulty of Paying Living Expenses: Not on file  Food Insecurity:   . Worried About Programme researcher, broadcasting/film/videounning Out of Food in the Last Year: Not on file  . Ran Out of Food in the Last Year: Not on file  Transportation Needs:     . Lack of Transportation (Medical): Not on file  . Lack of Transportation (Non-Medical): Not on file  Physical Activity:   . Days of Exercise per Week: Not on file  . Minutes of Exercise per Session: Not on file  Stress:   . Feeling of Stress : Not on file  Social Connections:   . Frequency of Communication with Friends and Family: Not on file  . Frequency of Social Gatherings with Friends and Family: Not on file  . Attends Religious Services: Not on file  . Active Member of Clubs or Organizations: Not on file  . Attends BankerClub or Organization Meetings: Not on file  . Marital Status: Not on file  Intimate Partner Violence:   . Fear of Current or Ex-Partner: Not on file  . Emotionally Abused: Not on file  . Physically Abused: Not on file  . Sexually Abused: Not on file    Functional Status Survey:    Family History  Problem Relation Age of Onset  . Fragile X syndrome Father   . Healthy Sister   . Breast cancer Neg Hx   . Stroke Neg Hx     Health Maintenance  Topic Date Due  . Hepatitis C Screening  Never done  . FOOT EXAM  Never done  . OPHTHALMOLOGY EXAM  Never done  . TETANUS/TDAP  Never done  . PNA vac Low Risk Adult (1 of 2 - PCV13) Never done  . HEMOGLOBIN A1C  02/17/2020  . INFLUENZA VACCINE  06/09/2020  . DEXA SCAN  Completed  . COVID-19 Vaccine  Completed    Allergies  Allergen Reactions  . Amoxicillin-Pot Clavulanate Hives  . Meloxicam     Unknown reaction  . Scallops [Shellfish Allergy] Other (See Comments)    "I don't feel so good after an hour"  . Toviaz [Fesoterodine Fumarate Er]     Unknown reaction    Allergies as of 07/11/2020      Reactions   Amoxicillin-pot Clavulanate Hives   Meloxicam    Unknown reaction   Scallops [shellfish Allergy] Other (See Comments)   "I don't feel so good after an hour"   Toviaz [fesoterodine Fumarate Er]    Unknown reaction      Medication List       Accurate as of July 11, 2020 11:59 AM. If you have  any questions, ask your nurse or doctor.        alendronate 70 MG tablet Commonly known as: FOSAMAX Take 70 mg by mouth once a week. Sundays   atorvastatin 40 MG tablet Commonly known as: LIPITOR Take 1 tablet (40 mg total) by mouth daily.   CALTRATE 600+D PO Take 1 tablet by mouth 2 (two) times daily.   CENTRUM SILVER  PO Take 1 tablet by mouth daily.   clopidogrel 75 MG tablet Commonly known as: Plavix Take 1 tablet (75 mg total) by mouth daily.   metFORMIN 500 MG tablet Commonly known as: GLUCOPHAGE Take 500 mg by mouth 2 (two) times daily with a meal.   oxyCODONE 5 MG immediate release tablet Commonly known as: Roxicodone Take 1 tablet (5 mg total) by mouth every 4 (four) hours as needed for severe pain.   ramipril 2.5 MG capsule Commonly known as: ALTACE Take 2.5 mg by mouth daily.   VITAMIN D PO Take 1 tablet by mouth daily. 1,000 units   zinc oxide 20 % ointment Apply 1 application topically as needed for irritation. To buttocks after every incontinent episode and as needed for redness. May keep at bedside.       Review of Systems  Review of Systems  Constitutional: Negative for activity change, appetite change, chills, diaphoresis, fatigue and fever.  HENT: Negative for mouth sores, postnasal drip, rhinorrhea, sinus pain and sore throat.   Respiratory: Negative for apnea, cough, chest tightness, shortness of breath and wheezing.   Cardiovascular: Negative for chest pain, palpitations and leg swelling.  Gastrointestinal: Negative for abdominal distention, abdominal pain, constipation, diarrhea, nausea and vomiting.  Genitourinary: Negative for dysuria and frequency.  Musculoskeletal: Negative for arthralgias, joint swelling and myalgias.  Skin: Negative for rash.  Neurological: Negative for dizziness, syncope, weakness, light-headedness and numbness.  Psychiatric/Behavioral: Negative for behavioral problems, confusion and sleep disturbance.     Vitals:     07/11/20 1150  BP: 131/61  Pulse: 82  Resp: 18  Temp: (!) 97.3 F (36.3 C)  SpO2: 94%  Weight: 135 lb (61.2 kg)  Height: 5\' 3"  (1.6 m)   Body mass index is 23.91 kg/m. Physical Exam  Constitutional:  Well-developed and well-nourished.  HENT:  Head: Normocephalic.  Mouth/Throat: Oropharynx is clear and moist.  Eyes: Pupils are equal, round, and reactive to light.  Neck: Neck supple.  Cardiovascular: Normal rate and normal heart sounds.  No murmur heard. Pulmonary/Chest: Effort normal and breath sounds normal. No respiratory distress. No wheezes. She has no rales.  Abdominal: Soft. Bowel sounds are normal. No distension. There is no tenderness. There is no rebound.  Musculoskeletal: No edema.  Lymphadenopathy: none Neurological: No Focal Deficits Skin: Skin is warm and dry.  Psychiatric: Normal mood and affect. Behavior is normal. Thought content normal.    Labs reviewed: Basic Metabolic Panel: Recent Labs    08/18/19 1715 07/10/20 1105  NA 136 136  K 4.0 4.2  CL 101 100  CO2 24 22  GLUCOSE 103* 187*  BUN 19 21  CREATININE 0.84 0.94  CALCIUM 9.6 9.3   Liver Function Tests: Recent Labs    08/18/19 1715  AST 18  ALT 18  ALKPHOS 41  BILITOT 0.7  PROT 6.7  ALBUMIN 4.2   No results for input(s): LIPASE, AMYLASE in the last 8760 hours. No results for input(s): AMMONIA in the last 8760 hours. CBC: Recent Labs    08/18/19 1715 07/10/20 1105  WBC 6.5 10.2  NEUTROABS 3.8 8.1*  HGB 13.1 12.5  HCT 39.4 37.4  MCV 95.9 93.3  PLT 226 251   Cardiac Enzymes: No results for input(s): CKTOTAL, CKMB, CKMBINDEX, TROPONINI in the last 8760 hours. BNP: Invalid input(s): POCBNP Lab Results  Component Value Date   HGBA1C 6.2 (H) 08/19/2019   Lab Results  Component Value Date   TSH 1.010 08/19/2019   No results found  for: VITAMINB12 No results found for: FOLATE No results found for: IRON, TIBC, FERRITIN  Imaging and Procedures obtained prior to SNF  admission: No results found.  Assessment/Plan  Hypertension, unspecified type Controlled on Ramipril Type 2 diabetes mellitus with other neurologic complication, without long-term current use of insulin (HCC) On Metformin Follows with her PCP Age related osteoporosis, unspecified pathological fracture presence On Fosamax Neurocognitive disorder Will Need AL transfer Follows Closely with Neurology Ataxia Working with therapy closed nondisplaced fracture of proximal end of right humerus with routine healing,  Needs to Follow with Ortho Pain Controlled on Tylenol and Pxycodone .H/o TIA On Plavix and Stain Hyperlipidemia On Lipitor  Family/ staff Communication:   Labs/tests ordered:

## 2020-07-16 DIAGNOSIS — S42294A Other nondisplaced fracture of upper end of right humerus, initial encounter for closed fracture: Secondary | ICD-10-CM | POA: Diagnosis not present

## 2020-07-29 ENCOUNTER — Non-Acute Institutional Stay (SKILLED_NURSING_FACILITY): Payer: Medicare Other | Admitting: Internal Medicine

## 2020-07-29 ENCOUNTER — Encounter: Payer: Self-pay | Admitting: Internal Medicine

## 2020-07-29 DIAGNOSIS — S0240ES Zygomatic fracture, right side, sequela: Secondary | ICD-10-CM | POA: Diagnosis not present

## 2020-07-29 DIAGNOSIS — R270 Ataxia, unspecified: Secondary | ICD-10-CM

## 2020-07-29 DIAGNOSIS — E1149 Type 2 diabetes mellitus with other diabetic neurological complication: Secondary | ICD-10-CM

## 2020-07-29 DIAGNOSIS — M810 Age-related osteoporosis without current pathological fracture: Secondary | ICD-10-CM

## 2020-07-29 DIAGNOSIS — R419 Unspecified symptoms and signs involving cognitive functions and awareness: Secondary | ICD-10-CM | POA: Diagnosis not present

## 2020-07-29 NOTE — Progress Notes (Signed)
Location:    [facility] Room Number: 28 Place of Service:  SNF [phone]) Provider:  Einar Crow MD  Georgann Housekeeper, MD  Patient Care Team: Georgann Housekeeper, MD as PCP - General (Internal Medicine) [name], DO as Consulting Physician (Neurology)  Extended Emergency Contact Information Primary Emergency Contact: [name],[name] Address: [address]          [place] [id number] [name] Home Phone: [phone] Relation: Sister Secondary Emergency Contact: Wicklund,Rick Mobile Phone: [phone] Relation: Brother  Code Status:  Managed [facility] Goals of care: Advanced Directive information Advanced Directives 07/11/2020  Does Patient Have a Medical Advance Directive? Yes  Type of Advance Directive Living will;Out of facility DNR (pink MOST or yellow form)  Does patient want to make changes to medical advance directive? No - Patient declined  Would patient like information on creating a medical advance directive? -  Pre-existing out of facility DNR order (yellow form or pink MOST form) Pink MOST form placed in chart (order not valid for inpatient use)     Chief Complaint  Patient presents with  . Acute Visit    Swelling under right eye    HPI:  Pt is a 79 y.o. 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  Ad[facility] SNF from [facility] after having unwitnessed fall and sustaining closed displaced fracture of proximal end of right humerus. Patient noticed swelling and tenderness below her right eye Where her glasses are. Patient had a CT done after her fall on her face which had shown a right-sided Zygomatic maxillary complex fracture without orbital floor fracture The ENT recommended soft mechanical diet for 4 weeks and follow-up with them if needed.  She  feels little tender at that spot. No Recent Falls Past Medical History:  Diagnosis Date  . Diabetes mellitus   . Dyslipidemia   . Hypertension   . Osteoporosis   . Urinary incontinence    History reviewed. No pertinent surgical history.  Allergies  Allergen Reactions  . Amoxicillin-Pot Clavulanate Hives  . Meloxicam     Unknown reaction  . Scallops [Shellfish Allergy] Other (See Comments)    "I don't feel so good after an hour"  . Toviaz [Fesoterodine Fumarate Er]     Unknown reaction    Allergies as of 07/29/2020      Reactions   Amoxicillin-pot Clavulanate Hives   Meloxicam    Unknown reaction   Scallops [shellfish Allergy] Other (See Comments)   "I don't feel so good after an hour"   Toviaz [fesoterodine Fumarate Er]    Unknown reaction      Medication List       Accurate as of July 29, 2020 10:54 AM. If you have any questions, ask your nurse or doctor.        alendronate 70 MG tablet Commonly known as: FOSAMAX Take 70 mg by mouth once a week. Sundays   atorvastatin 40 MG tablet Commonly known as: LIPITOR Take 1 tablet (40 mg total) by mouth daily.   CALTRATE 600+D PO Take 1 tablet by mouth 2 (two) times daily.   CENTRUM SILVER PO Take 1 tablet by mouth daily.   clopidogrel 75 MG tablet Commonly known as: Plavix Take 1 tablet (75 mg total) by mouth daily.   metFORMIN 500 MG tablet  Commonly known as: GLUCOPHAGE Take 500 mg by mouth 2 (two) times daily with a meal.   oxyCODONE 5 MG immediate release tablet Commonly known as: Roxicodone Take 1 tablet (5 mg total) by mouth every 4 (four) hours as needed for severe pain.   ramipril 2.5 MG capsule Commonly known as: ALTACE Take 2.5 mg by mouth daily.   solifenacin 10 MG tablet Commonly known as: VESICARE Take 10 mg by mouth daily.   VITAMIN D PO Take 1 tablet by mouth daily. 1,000 units   zinc oxide 20 % ointment Apply 1 application topically as needed for irritation. To buttocks after  every incontinent episode and as needed for redness. May keep at bedside.       Review of Systems  Review of Systems  Constitutional: Negative for activity change, appetite change, chills, diaphoresis, fatigue and fever.  HENT: Negative for mouth sores, postnasal drip, rhinorrhea, sinus pain and sore throat.   Respiratory: Negative for apnea, cough, chest tightness, shortness of breath and wheezing.   Cardiovascular: Negative for chest pain, palpitations and leg swelling.  Gastrointestinal: Negative for abdominal distention, abdominal pain, constipation, diarrhea, nausea and vomiting.  Genitourinary: Negative for dysuria and frequency.  Musculoskeletal: Negative for arthralgias, joint swelling and myalgias.  Skin: Negative for rash.  Neurological: Negative for dizziness, syncope, weakness, light-headedness and numbness.  Psychiatric/Behavioral: Negative for behavioral problems, confusion and sleep disturbance.     Immunization History  Administered Date(s) Administered  . Influenza Inj Mdck Quad Pf 08/10/2019  . Moderna SARS-COVID-2 Vaccination 12/11/2019, 01/08/2020  . Zoster 07/01/2020   Pertinent  Health Maintenance Due  Topic Date Due  . FOOT EXAM  Never done  . OPHTHALMOLOGY EXAM  Never done  . PNA vac Low Risk Adult (1 of 2 - PCV13) Never done  . INFLUENZA VACCINE  06/09/2020  . HEMOGLOBIN A1C  01/08/2021  . DEXA SCAN  Completed   Fall Risk  03/05/2020 09/06/2019  Falls in the past year? 1 1  Number falls in past yr: 0 0  Injury with Fall? 1 1   Functional Status Survey:    Vitals:   07/29/20 1049  BP: (!) 100/58  Pulse: 68  Resp: 18  Temp: (!) 97.1 F (36.2 C)  SpO2: 98%  Weight: 131 lb 14.4 oz (59.8 kg)  Height: 5\' 3"  (1.6 m)   Body mass index is 23.37 kg/m. Physical Exam Constitutional: Oriented to person, place, and time. Well-developed and well-nourished.  HENT:  Head: Normocephalic.   Mouth/Throat: Oropharynx is clear and moist.  Eyes: Pupils  are equal, round, and reactive to light. Area Under her Right eye feels like hard area where she is tender Neck: Neck supple.  Cardiovascular: Normal rate and normal heart sounds.  No murmur heard. Pulmonary/Chest: Effort normal and breath sounds normal. No respiratory distress. No wheezes. She has no rales.  Abdominal: Soft. Bowel sounds are normal. No distension. There is no tenderness. There is no rebound.  Musculoskeletal: No edema.  Lymphadenopathy: none Neurological: Alert and oriented to person, place, and time.  Skin: Skin is warm and dry.  Psychiatric: Normal mood and affect. Behavior is normal. Thought content normal.   Labs reviewed: Recent Labs    08/18/19 1715 07/10/20 1105 07/11/20 0000  NA 136 136 138  K 4.0 4.2 4.2  CL 101 100 104  CO2 24 22 23*  GLUCOSE 103* 187*  --   BUN 19 21 21   CREATININE 0.84 0.94 1.0  CALCIUM 9.6 9.3 9.1  Recent Labs    08/18/19 1715 07/11/20 0000  AST 18 14  ALT 18 10  ALKPHOS 41 53  BILITOT 0.7  --   PROT 6.7  --   ALBUMIN 4.2 3.5   Recent Labs    08/18/19 1715 07/10/20 1105 07/11/20 0000  WBC 6.5 10.2 6.4  NEUTROABS 3.8 8.1* 3,725  HGB 13.1 12.5 12.1  HCT 39.4 37.4 36  MCV 95.9 93.3  --   PLT 226 251 257   Lab Results  Component Value Date   TSH 0.96 07/11/2020   Lab Results  Component Value Date   HGBA1C 6.9 07/11/2020   Lab Results  Component Value Date   CHOL 145 08/20/2019   HDL 32 (L) 08/20/2019   LDLCALC 86 08/20/2019   TRIG 133 08/20/2019   CHOLHDL 4.5 08/20/2019    Significant Diagnostic Results in last 30 days:  CT Head Wo Contrast  Result Date: 07/06/2020 CLINICAL DATA:  Head and neck pain after a fall. EXAM: CT HEAD WITHOUT CONTRAST CT CERVICAL SPINE WITHOUT CONTRAST TECHNIQUE: Multidetector CT imaging of the head and cervical spine was performed following the standard protocol without intravenous contrast. Multiplanar CT image reconstructions of the cervical spine were also generated.  COMPARISON:  CT head dated 08/18/2019 and CT neck dated 08/19/2019 FINDINGS: CT HEAD FINDINGS Brain: No evidence of acute infarction, hemorrhage, hydrocephalus, extra-axial collection or mass lesion/mass effect. There is mild cerebral volume loss with associated ex vacuo dilatation. Periventricular white matter hypoattenuation likely represents chronic small vessel ischemic disease. Vascular: There are vascular calcifications in the carotid siphons. Skull: There is an apparent cortical discontinuity of the right zygomatic arch which is partially imaged. This was not seen on prior head CT and is favored to represent an acute fracture given the partially imaged mild overlying fat stranding. Sinuses/Orbits: Fluid layers dependently in the right maxillary sinus. Other: None. CT CERVICAL SPINE FINDINGS Alignment: Normal. Skull base and vertebrae: No acute fracture. No primary bone lesion or focal pathologic process. Soft tissues and spinal canal: No prevertebral fluid or swelling. No visible canal hematoma. Disc levels:  Multilevel degenerative disc and joint disease. Upper chest: Negative. Other: A 1.8 cm hypoechoic left thyroid nodule is not significantly changed since 08/19/2019. IMPRESSION: 1. No acute intracranial process. 2. No acute osseous injury in the cervical spine. 3. Cortical discontinuity of the right zygomatic arch is partially imaged. This in combination with fluid layering dependently in the right maxillary sinus raises concern for a zygomaticomaxillary complex fracture and CT face is recommended for further evaluation. 4. Hypoechoic left thyroid nodule is not significantly changed since 08/19/2019. Recommend further evaluation with thyroid ultrasound. (ref: J Am Coll Radiol. 2015 Feb;12(2): 143-50). Electronically Signed   By: Romona Curls M.D.   On: 07/06/2020 12:33   CT Cervical Spine Wo Contrast  Result Date: 07/06/2020 CLINICAL DATA:  Head and neck pain after a fall. EXAM: CT HEAD WITHOUT  CONTRAST CT CERVICAL SPINE WITHOUT CONTRAST TECHNIQUE: Multidetector CT imaging of the head and cervical spine was performed following the standard protocol without intravenous contrast. Multiplanar CT image reconstructions of the cervical spine were also generated. COMPARISON:  CT head dated 08/18/2019 and CT neck dated 08/19/2019 FINDINGS: CT HEAD FINDINGS Brain: No evidence of acute infarction, hemorrhage, hydrocephalus, extra-axial collection or mass lesion/mass effect. There is mild cerebral volume loss with associated ex vacuo dilatation. Periventricular white matter hypoattenuation likely represents chronic small vessel ischemic disease. Vascular: There are vascular calcifications in the carotid siphons. Skull: There  is an apparent cortical discontinuity of the right zygomatic arch which is partially imaged. This was not seen on prior head CT and is favored to represent an acute fracture given the partially imaged mild overlying fat stranding. Sinuses/Orbits: Fluid layers dependently in the right maxillary sinus. Other: None. CT CERVICAL SPINE FINDINGS Alignment: Normal. Skull base and vertebrae: No acute fracture. No primary bone lesion or focal pathologic process. Soft tissues and spinal canal: No prevertebral fluid or swelling. No visible canal hematoma. Disc levels:  Multilevel degenerative disc and joint disease. Upper chest: Negative. Other: A 1.8 cm hypoechoic left thyroid nodule is not significantly changed since 08/19/2019. IMPRESSION: 1. No acute intracranial process. 2. No acute osseous injury in the cervical spine. 3. Cortical discontinuity of the right zygomatic arch is partially imaged. This in combination with fluid layering dependently in the right maxillary sinus raises concern for a zygomaticomaxillary complex fracture and CT face is recommended for further evaluation. 4. Hypoechoic left thyroid nodule is not significantly changed since 08/19/2019. Recommend further evaluation with thyroid  ultrasound. (ref: J Am Coll Radiol. 2015 Feb;12(2): 143-50). Electronically Signed   By: Romona Curls M.D.   On: 07/06/2020 12:33   DG Shoulder Right Port  Result Date: 07/06/2020 CLINICAL DATA:  Pt arrived via EMS from [facility], patient was reaching for something on the floor when she was in bed, rolled out of bed and fell on the floor, unwitnessed fall around 0845. Pt complained of right shoulder pain and right hand pain. EXAM: PORTABLE RIGHT SHOULDER COMPARISON:  None. FINDINGS: There is an impacted fracture at the neck of the proximal humerus. No evidence of dislocation. There are AC joint degenerative changes. Regional soft tissues are unremarkable. IMPRESSION: Impacted fracture of the proximal right humerus. Electronically Signed   By: Emmaline Kluver M.D.   On: 07/06/2020 11:18   DG Hand Complete Right  Result Date: 07/06/2020 CLINICAL DATA:  Pt arrived via EMS from [facility], patient was reaching for something on the floor when she was in bed, rolled out of bed and fell on the floor, unwitnessed fall around 0845. Pt complained of right shoulder pain and right hand pain. EXAM: RIGHT HAND - COMPLETE 3+ VIEW COMPARISON:  None. FINDINGS: There is no evidence of fracture or dislocation. Mild degenerative changes. No erosive changes. Osteopenia. Soft tissues are unremarkable. IMPRESSION: Negative. Electronically Signed   By: Emmaline Kluver M.D.   On: 07/06/2020 11:20   CT Maxillofacial Wo Contrast  Result Date: 07/06/2020 CLINICAL DATA:  Facial trauma with pain EXAM: CT MAXILLOFACIAL WITHOUT CONTRAST TECHNIQUE: Multidetector CT imaging of the maxillofacial structures was performed. Multiplanar CT image reconstructions were also generated. COMPARISON:  Same day CT head FINDINGS: Osseous: There is a right-sided zygomaticomaxillary complex fracture which involves fractures of the zygomatic arch, inferior orbital rim, anterior and posterior walls of the right  maxillary sinus, and diastasis of the suture between the zygomatic bone and greater wing of sphenoid within right lateral orbital wall (series 4, images 21-28). Orbits: The globes are intact.  No intraorbital gas or hematoma. Sinuses: Fluid layering in the right maxillary sinus likely represents blood products. Soft tissues: There is soft tissue swelling overlying the right face. Limited intracranial: No significant or unexpected finding. IMPRESSION: Right-sided zygomaticomaxillary complex fracture as above. Electronically Signed   By: Romona Curls M.D.   On: 07/06/2020 14:18    Assessment/Plan S/P Complex Zygomatic Maxillary Fracture On Soft Diet Will do Xray of facial bones in  facility. Consider follow up with ENT if continues to be tender  Addendum  Xrays Came back with No acute signs of fracture Continue to monitor  Other Issues  Type 2 diabetes mellitus with other neurologic complication, without long-term current use of insulin (HCC) On Metformin And ACE inhibitor Follows with her PCP Age related osteoporosis, unspecified pathological fracture presence On Fosamax Neurocognitive disorder Will Need AL transfer Follows Closely with Neurology Ataxia Working with therapy closed nondisplaced fracture of proximal end of right humerus with routine healing,   Follow with Ortho Pain Controlled on Tylenol and oxycodone .H/o TIA On Plavix and Stain Hyperlipidemia On Lipitor   Family/ staff Communication:   Labs/tests ordered:

## 2020-07-30 DIAGNOSIS — R52 Pain, unspecified: Secondary | ICD-10-CM | POA: Diagnosis not present

## 2020-08-01 DIAGNOSIS — M6281 Muscle weakness (generalized): Secondary | ICD-10-CM | POA: Diagnosis not present

## 2020-08-01 DIAGNOSIS — S0230XD Fracture of orbital floor, unspecified side, subsequent encounter for fracture with routine healing: Secondary | ICD-10-CM | POA: Diagnosis not present

## 2020-08-01 DIAGNOSIS — S42294D Other nondisplaced fracture of upper end of right humerus, subsequent encounter for fracture with routine healing: Secondary | ICD-10-CM | POA: Diagnosis not present

## 2020-08-01 DIAGNOSIS — G459 Transient cerebral ischemic attack, unspecified: Secondary | ICD-10-CM | POA: Diagnosis not present

## 2020-08-01 DIAGNOSIS — E785 Hyperlipidemia, unspecified: Secondary | ICD-10-CM | POA: Diagnosis not present

## 2020-08-01 DIAGNOSIS — S02402D Zygomatic fracture, unspecified, subsequent encounter for fracture with routine healing: Secondary | ICD-10-CM | POA: Diagnosis not present

## 2020-08-01 DIAGNOSIS — Z9181 History of falling: Secondary | ICD-10-CM | POA: Diagnosis not present

## 2020-08-01 DIAGNOSIS — S02401D Maxillary fracture, unspecified, subsequent encounter for fracture with routine healing: Secondary | ICD-10-CM | POA: Diagnosis not present

## 2020-08-01 DIAGNOSIS — I10 Essential (primary) hypertension: Secondary | ICD-10-CM | POA: Diagnosis not present

## 2020-08-01 DIAGNOSIS — S42201D Unspecified fracture of upper end of right humerus, subsequent encounter for fracture with routine healing: Secondary | ICD-10-CM | POA: Diagnosis not present

## 2020-08-01 DIAGNOSIS — R296 Repeated falls: Secondary | ICD-10-CM | POA: Diagnosis not present

## 2020-08-01 DIAGNOSIS — S0280XD Fracture of other specified skull and facial bones, unspecified side, subsequent encounter for fracture with routine healing: Secondary | ICD-10-CM | POA: Diagnosis not present

## 2020-08-01 DIAGNOSIS — R2681 Unsteadiness on feet: Secondary | ICD-10-CM | POA: Diagnosis not present

## 2020-08-01 DIAGNOSIS — R1311 Dysphagia, oral phase: Secondary | ICD-10-CM | POA: Diagnosis not present

## 2020-08-05 DIAGNOSIS — M6281 Muscle weakness (generalized): Secondary | ICD-10-CM | POA: Diagnosis not present

## 2020-08-05 DIAGNOSIS — G459 Transient cerebral ischemic attack, unspecified: Secondary | ICD-10-CM | POA: Diagnosis not present

## 2020-08-05 DIAGNOSIS — R2681 Unsteadiness on feet: Secondary | ICD-10-CM | POA: Diagnosis not present

## 2020-08-05 DIAGNOSIS — I10 Essential (primary) hypertension: Secondary | ICD-10-CM | POA: Diagnosis not present

## 2020-08-05 DIAGNOSIS — Z9181 History of falling: Secondary | ICD-10-CM | POA: Diagnosis not present

## 2020-08-05 DIAGNOSIS — S02401D Maxillary fracture, unspecified, subsequent encounter for fracture with routine healing: Secondary | ICD-10-CM | POA: Diagnosis not present

## 2020-08-05 DIAGNOSIS — S02402D Zygomatic fracture, unspecified, subsequent encounter for fracture with routine healing: Secondary | ICD-10-CM | POA: Diagnosis not present

## 2020-08-05 DIAGNOSIS — S42201D Unspecified fracture of upper end of right humerus, subsequent encounter for fracture with routine healing: Secondary | ICD-10-CM | POA: Diagnosis not present

## 2020-08-05 DIAGNOSIS — S0280XD Fracture of other specified skull and facial bones, unspecified side, subsequent encounter for fracture with routine healing: Secondary | ICD-10-CM | POA: Diagnosis not present

## 2020-08-05 DIAGNOSIS — E785 Hyperlipidemia, unspecified: Secondary | ICD-10-CM | POA: Diagnosis not present

## 2020-08-05 DIAGNOSIS — R296 Repeated falls: Secondary | ICD-10-CM | POA: Diagnosis not present

## 2020-08-05 DIAGNOSIS — S0230XD Fracture of orbital floor, unspecified side, subsequent encounter for fracture with routine healing: Secondary | ICD-10-CM | POA: Diagnosis not present

## 2020-08-05 DIAGNOSIS — R1311 Dysphagia, oral phase: Secondary | ICD-10-CM | POA: Diagnosis not present

## 2020-08-06 DIAGNOSIS — I10 Essential (primary) hypertension: Secondary | ICD-10-CM | POA: Diagnosis not present

## 2020-08-06 DIAGNOSIS — M6281 Muscle weakness (generalized): Secondary | ICD-10-CM | POA: Diagnosis not present

## 2020-08-06 DIAGNOSIS — S02401D Maxillary fracture, unspecified, subsequent encounter for fracture with routine healing: Secondary | ICD-10-CM | POA: Diagnosis not present

## 2020-08-06 DIAGNOSIS — S0230XD Fracture of orbital floor, unspecified side, subsequent encounter for fracture with routine healing: Secondary | ICD-10-CM | POA: Diagnosis not present

## 2020-08-06 DIAGNOSIS — Z9181 History of falling: Secondary | ICD-10-CM | POA: Diagnosis not present

## 2020-08-06 DIAGNOSIS — G459 Transient cerebral ischemic attack, unspecified: Secondary | ICD-10-CM | POA: Diagnosis not present

## 2020-08-06 DIAGNOSIS — S42201D Unspecified fracture of upper end of right humerus, subsequent encounter for fracture with routine healing: Secondary | ICD-10-CM | POA: Diagnosis not present

## 2020-08-06 DIAGNOSIS — S0280XD Fracture of other specified skull and facial bones, unspecified side, subsequent encounter for fracture with routine healing: Secondary | ICD-10-CM | POA: Diagnosis not present

## 2020-08-06 DIAGNOSIS — R1311 Dysphagia, oral phase: Secondary | ICD-10-CM | POA: Diagnosis not present

## 2020-08-06 DIAGNOSIS — R296 Repeated falls: Secondary | ICD-10-CM | POA: Diagnosis not present

## 2020-08-06 DIAGNOSIS — S02402D Zygomatic fracture, unspecified, subsequent encounter for fracture with routine healing: Secondary | ICD-10-CM | POA: Diagnosis not present

## 2020-08-06 DIAGNOSIS — E785 Hyperlipidemia, unspecified: Secondary | ICD-10-CM | POA: Diagnosis not present

## 2020-08-06 DIAGNOSIS — R2681 Unsteadiness on feet: Secondary | ICD-10-CM | POA: Diagnosis not present

## 2020-08-07 DIAGNOSIS — M6281 Muscle weakness (generalized): Secondary | ICD-10-CM | POA: Diagnosis not present

## 2020-08-07 DIAGNOSIS — R296 Repeated falls: Secondary | ICD-10-CM | POA: Diagnosis not present

## 2020-08-07 DIAGNOSIS — S42201D Unspecified fracture of upper end of right humerus, subsequent encounter for fracture with routine healing: Secondary | ICD-10-CM | POA: Diagnosis not present

## 2020-08-07 DIAGNOSIS — R1311 Dysphagia, oral phase: Secondary | ICD-10-CM | POA: Diagnosis not present

## 2020-08-07 DIAGNOSIS — I10 Essential (primary) hypertension: Secondary | ICD-10-CM | POA: Diagnosis not present

## 2020-08-07 DIAGNOSIS — S02401D Maxillary fracture, unspecified, subsequent encounter for fracture with routine healing: Secondary | ICD-10-CM | POA: Diagnosis not present

## 2020-08-07 DIAGNOSIS — R2681 Unsteadiness on feet: Secondary | ICD-10-CM | POA: Diagnosis not present

## 2020-08-07 DIAGNOSIS — Z9181 History of falling: Secondary | ICD-10-CM | POA: Diagnosis not present

## 2020-08-07 DIAGNOSIS — E785 Hyperlipidemia, unspecified: Secondary | ICD-10-CM | POA: Diagnosis not present

## 2020-08-07 DIAGNOSIS — S0230XD Fracture of orbital floor, unspecified side, subsequent encounter for fracture with routine healing: Secondary | ICD-10-CM | POA: Diagnosis not present

## 2020-08-07 DIAGNOSIS — S0280XD Fracture of other specified skull and facial bones, unspecified side, subsequent encounter for fracture with routine healing: Secondary | ICD-10-CM | POA: Diagnosis not present

## 2020-08-07 DIAGNOSIS — S02402D Zygomatic fracture, unspecified, subsequent encounter for fracture with routine healing: Secondary | ICD-10-CM | POA: Diagnosis not present

## 2020-08-07 DIAGNOSIS — G459 Transient cerebral ischemic attack, unspecified: Secondary | ICD-10-CM | POA: Diagnosis not present

## 2020-08-09 DIAGNOSIS — R296 Repeated falls: Secondary | ICD-10-CM | POA: Diagnosis not present

## 2020-08-09 DIAGNOSIS — M6281 Muscle weakness (generalized): Secondary | ICD-10-CM | POA: Diagnosis not present

## 2020-08-09 DIAGNOSIS — Z9181 History of falling: Secondary | ICD-10-CM | POA: Diagnosis not present

## 2020-08-09 DIAGNOSIS — G459 Transient cerebral ischemic attack, unspecified: Secondary | ICD-10-CM | POA: Diagnosis not present

## 2020-08-09 DIAGNOSIS — R2681 Unsteadiness on feet: Secondary | ICD-10-CM | POA: Diagnosis not present

## 2020-08-09 DIAGNOSIS — R1311 Dysphagia, oral phase: Secondary | ICD-10-CM | POA: Diagnosis not present

## 2020-08-09 DIAGNOSIS — S02401D Maxillary fracture, unspecified, subsequent encounter for fracture with routine healing: Secondary | ICD-10-CM | POA: Diagnosis not present

## 2020-08-09 DIAGNOSIS — S02402D Zygomatic fracture, unspecified, subsequent encounter for fracture with routine healing: Secondary | ICD-10-CM | POA: Diagnosis not present

## 2020-08-12 DIAGNOSIS — S02401D Maxillary fracture, unspecified, subsequent encounter for fracture with routine healing: Secondary | ICD-10-CM | POA: Diagnosis not present

## 2020-08-12 DIAGNOSIS — M6281 Muscle weakness (generalized): Secondary | ICD-10-CM | POA: Diagnosis not present

## 2020-08-12 DIAGNOSIS — S02402D Zygomatic fracture, unspecified, subsequent encounter for fracture with routine healing: Secondary | ICD-10-CM | POA: Diagnosis not present

## 2020-08-12 DIAGNOSIS — G459 Transient cerebral ischemic attack, unspecified: Secondary | ICD-10-CM | POA: Diagnosis not present

## 2020-08-12 DIAGNOSIS — R296 Repeated falls: Secondary | ICD-10-CM | POA: Diagnosis not present

## 2020-08-12 DIAGNOSIS — Z9181 History of falling: Secondary | ICD-10-CM | POA: Diagnosis not present

## 2020-08-12 DIAGNOSIS — R2681 Unsteadiness on feet: Secondary | ICD-10-CM | POA: Diagnosis not present

## 2020-08-12 DIAGNOSIS — R1311 Dysphagia, oral phase: Secondary | ICD-10-CM | POA: Diagnosis not present

## 2020-08-13 DIAGNOSIS — S02402D Zygomatic fracture, unspecified, subsequent encounter for fracture with routine healing: Secondary | ICD-10-CM | POA: Diagnosis not present

## 2020-08-13 DIAGNOSIS — R1311 Dysphagia, oral phase: Secondary | ICD-10-CM | POA: Diagnosis not present

## 2020-08-13 DIAGNOSIS — R296 Repeated falls: Secondary | ICD-10-CM | POA: Diagnosis not present

## 2020-08-13 DIAGNOSIS — G459 Transient cerebral ischemic attack, unspecified: Secondary | ICD-10-CM | POA: Diagnosis not present

## 2020-08-13 DIAGNOSIS — R2681 Unsteadiness on feet: Secondary | ICD-10-CM | POA: Diagnosis not present

## 2020-08-13 DIAGNOSIS — Z9181 History of falling: Secondary | ICD-10-CM | POA: Diagnosis not present

## 2020-08-13 DIAGNOSIS — S02401D Maxillary fracture, unspecified, subsequent encounter for fracture with routine healing: Secondary | ICD-10-CM | POA: Diagnosis not present

## 2020-08-13 DIAGNOSIS — M6281 Muscle weakness (generalized): Secondary | ICD-10-CM | POA: Diagnosis not present

## 2020-08-14 DIAGNOSIS — S02402D Zygomatic fracture, unspecified, subsequent encounter for fracture with routine healing: Secondary | ICD-10-CM | POA: Diagnosis not present

## 2020-08-14 DIAGNOSIS — R296 Repeated falls: Secondary | ICD-10-CM | POA: Diagnosis not present

## 2020-08-14 DIAGNOSIS — M6281 Muscle weakness (generalized): Secondary | ICD-10-CM | POA: Diagnosis not present

## 2020-08-14 DIAGNOSIS — S02401D Maxillary fracture, unspecified, subsequent encounter for fracture with routine healing: Secondary | ICD-10-CM | POA: Diagnosis not present

## 2020-08-14 DIAGNOSIS — R2681 Unsteadiness on feet: Secondary | ICD-10-CM | POA: Diagnosis not present

## 2020-08-14 DIAGNOSIS — R1311 Dysphagia, oral phase: Secondary | ICD-10-CM | POA: Diagnosis not present

## 2020-08-14 DIAGNOSIS — Z9181 History of falling: Secondary | ICD-10-CM | POA: Diagnosis not present

## 2020-08-14 DIAGNOSIS — G459 Transient cerebral ischemic attack, unspecified: Secondary | ICD-10-CM | POA: Diagnosis not present

## 2020-08-15 DIAGNOSIS — M6281 Muscle weakness (generalized): Secondary | ICD-10-CM | POA: Diagnosis not present

## 2020-08-15 DIAGNOSIS — S02402D Zygomatic fracture, unspecified, subsequent encounter for fracture with routine healing: Secondary | ICD-10-CM | POA: Diagnosis not present

## 2020-08-15 DIAGNOSIS — R1311 Dysphagia, oral phase: Secondary | ICD-10-CM | POA: Diagnosis not present

## 2020-08-15 DIAGNOSIS — R296 Repeated falls: Secondary | ICD-10-CM | POA: Diagnosis not present

## 2020-08-15 DIAGNOSIS — S02401D Maxillary fracture, unspecified, subsequent encounter for fracture with routine healing: Secondary | ICD-10-CM | POA: Diagnosis not present

## 2020-08-15 DIAGNOSIS — Z9181 History of falling: Secondary | ICD-10-CM | POA: Diagnosis not present

## 2020-08-15 DIAGNOSIS — R2681 Unsteadiness on feet: Secondary | ICD-10-CM | POA: Diagnosis not present

## 2020-08-15 DIAGNOSIS — G459 Transient cerebral ischemic attack, unspecified: Secondary | ICD-10-CM | POA: Diagnosis not present

## 2020-08-16 DIAGNOSIS — R296 Repeated falls: Secondary | ICD-10-CM | POA: Diagnosis not present

## 2020-08-16 DIAGNOSIS — R1311 Dysphagia, oral phase: Secondary | ICD-10-CM | POA: Diagnosis not present

## 2020-08-16 DIAGNOSIS — Z9181 History of falling: Secondary | ICD-10-CM | POA: Diagnosis not present

## 2020-08-16 DIAGNOSIS — S02401D Maxillary fracture, unspecified, subsequent encounter for fracture with routine healing: Secondary | ICD-10-CM | POA: Diagnosis not present

## 2020-08-16 DIAGNOSIS — S02402D Zygomatic fracture, unspecified, subsequent encounter for fracture with routine healing: Secondary | ICD-10-CM | POA: Diagnosis not present

## 2020-08-16 DIAGNOSIS — M6281 Muscle weakness (generalized): Secondary | ICD-10-CM | POA: Diagnosis not present

## 2020-08-16 DIAGNOSIS — G459 Transient cerebral ischemic attack, unspecified: Secondary | ICD-10-CM | POA: Diagnosis not present

## 2020-08-16 DIAGNOSIS — R2681 Unsteadiness on feet: Secondary | ICD-10-CM | POA: Diagnosis not present

## 2020-08-19 DIAGNOSIS — S02401D Maxillary fracture, unspecified, subsequent encounter for fracture with routine healing: Secondary | ICD-10-CM | POA: Diagnosis not present

## 2020-08-19 DIAGNOSIS — R2681 Unsteadiness on feet: Secondary | ICD-10-CM | POA: Diagnosis not present

## 2020-08-19 DIAGNOSIS — Z9181 History of falling: Secondary | ICD-10-CM | POA: Diagnosis not present

## 2020-08-19 DIAGNOSIS — S02402D Zygomatic fracture, unspecified, subsequent encounter for fracture with routine healing: Secondary | ICD-10-CM | POA: Diagnosis not present

## 2020-08-19 DIAGNOSIS — M6281 Muscle weakness (generalized): Secondary | ICD-10-CM | POA: Diagnosis not present

## 2020-08-19 DIAGNOSIS — R1311 Dysphagia, oral phase: Secondary | ICD-10-CM | POA: Diagnosis not present

## 2020-08-19 DIAGNOSIS — G459 Transient cerebral ischemic attack, unspecified: Secondary | ICD-10-CM | POA: Diagnosis not present

## 2020-08-19 DIAGNOSIS — R296 Repeated falls: Secondary | ICD-10-CM | POA: Diagnosis not present

## 2020-08-20 ENCOUNTER — Non-Acute Institutional Stay (SKILLED_NURSING_FACILITY): Payer: Medicare Other | Admitting: Nurse Practitioner

## 2020-08-20 ENCOUNTER — Encounter: Payer: Self-pay | Admitting: Nurse Practitioner

## 2020-08-20 DIAGNOSIS — S02402D Zygomatic fracture, unspecified, subsequent encounter for fracture with routine healing: Secondary | ICD-10-CM | POA: Diagnosis not present

## 2020-08-20 DIAGNOSIS — S02401A Maxillary fracture, unspecified, initial encounter for closed fracture: Secondary | ICD-10-CM

## 2020-08-20 DIAGNOSIS — G459 Transient cerebral ischemic attack, unspecified: Secondary | ICD-10-CM | POA: Diagnosis not present

## 2020-08-20 DIAGNOSIS — R32 Unspecified urinary incontinence: Secondary | ICD-10-CM

## 2020-08-20 DIAGNOSIS — E1149 Type 2 diabetes mellitus with other diabetic neurological complication: Secondary | ICD-10-CM

## 2020-08-20 DIAGNOSIS — I10 Essential (primary) hypertension: Secondary | ICD-10-CM

## 2020-08-20 DIAGNOSIS — M6281 Muscle weakness (generalized): Secondary | ICD-10-CM | POA: Diagnosis not present

## 2020-08-20 DIAGNOSIS — S02402A Zygomatic fracture, unspecified, initial encounter for closed fracture: Secondary | ICD-10-CM

## 2020-08-20 DIAGNOSIS — Z9181 History of falling: Secondary | ICD-10-CM | POA: Diagnosis not present

## 2020-08-20 DIAGNOSIS — S0230XA Fracture of orbital floor, unspecified side, initial encounter for closed fracture: Secondary | ICD-10-CM

## 2020-08-20 DIAGNOSIS — R419 Unspecified symptoms and signs involving cognitive functions and awareness: Secondary | ICD-10-CM

## 2020-08-20 DIAGNOSIS — S0280XA Fracture of other specified skull and facial bones, unspecified side, initial encounter for closed fracture: Secondary | ICD-10-CM

## 2020-08-20 DIAGNOSIS — S92514A Nondisplaced fracture of proximal phalanx of right lesser toe(s), initial encounter for closed fracture: Secondary | ICD-10-CM | POA: Diagnosis not present

## 2020-08-20 DIAGNOSIS — M810 Age-related osteoporosis without current pathological fracture: Secondary | ICD-10-CM

## 2020-08-20 DIAGNOSIS — R2681 Unsteadiness on feet: Secondary | ICD-10-CM | POA: Diagnosis not present

## 2020-08-20 DIAGNOSIS — R270 Ataxia, unspecified: Secondary | ICD-10-CM

## 2020-08-20 DIAGNOSIS — R1311 Dysphagia, oral phase: Secondary | ICD-10-CM | POA: Diagnosis not present

## 2020-08-20 DIAGNOSIS — R296 Repeated falls: Secondary | ICD-10-CM | POA: Diagnosis not present

## 2020-08-20 DIAGNOSIS — S02401D Maxillary fracture, unspecified, subsequent encounter for fracture with routine healing: Secondary | ICD-10-CM | POA: Diagnosis not present

## 2020-08-20 NOTE — Assessment & Plan Note (Signed)
Tremor/Ataxia syndrome, frequent falls, last saw Neurology 03/22/20, MRI showed pathologic atrophy cerebellum  

## 2020-08-20 NOTE — Assessment & Plan Note (Signed)
Nerocoognitive dysfunction/dementia, affecting frontal subcortical abilities.  

## 2020-08-20 NOTE — Assessment & Plan Note (Signed)
Managed, continue Vesicare

## 2020-08-20 NOTE — Assessment & Plan Note (Signed)
Blood pressure rungs low, but the patient denied headache, generalized malaise, change of vision, chest pian/pressure. Will continue Ramipril 2.5mg  qd,  Atorvastatin, Plavix

## 2020-08-20 NOTE — Assessment & Plan Note (Signed)
ED eval 07/06/20 for fall, a close displaced fx of proximal end of right humerus. Sling/swath right arm.  Tylenol, Oxycodone for pain. Healing nicely.

## 2020-08-20 NOTE — Assessment & Plan Note (Signed)
OP, takes Alendronate, Vit D, Ca 

## 2020-08-20 NOTE — Assessment & Plan Note (Signed)
T2DM, takes Metformin, Hgb a1c 6.9, TSH 0.96 07/2020, due for eye, foot exam.

## 2020-08-20 NOTE — Progress Notes (Signed)
Location:    [facility] Room Number: 28 Place of Service:  SNF (31) Provider:  Sydell Axon, Arna Snipe NP  Mahlon Gammon, MD  Patient Care Team: Mahlon Gammon, MD as PCP - General (Internal Medicine) [name], DO as Consulting Physician (Neurology)  Extended Emergency Contact Information Primary Emergency Contact: [name],[name] Address: [address]          [place] [address] [place] Home Phone: [phone] Relation: Sister Secondary Emergency Contact: [facility] Address: [address] [place], [place] [address] [name] Mobile Phone: [phone] Relation: Brother  Code Status:  Managed Care Goals of care: Advanced Directive information Advanced Directives 08/20/2020  Does Patient Have a Medical Advance Directive? Yes  Type of Advance Directive Healthcare Power of Attorney  Does patient want to make changes to medical advance directive? No - Patient declined  Copy of Healthcare Power of Attorney in Chart? Yes - validated most recent copy scanned in chart (See row information)  Would patient like information on creating a medical advance directive? -  Pre-existing out of facility DNR order (yellow form or pink MOST form) Pink MOST form placed in chart (order not valid for inpatient use)     Chief Complaint  Patient presents with   Medical Management of Chronic Issues   Health Maintenance    Foot and eye exam, TDAP, PCV13 and Hep C screen    HPI:  Pt is a 79 y.o. female seen today for medical management of chronic diseases.     ED eval 07/06/20 for fall, a close displaced fx of proximal end of right humerus, closed fracture of right zygomatic arch-f/u X-ray [facility] showed no acute signs of fx. . Sling/swath right arm.  Tylenol, Oxycodone for pain. Healing nicely.              OP, takes Alendronate, Vit D, Ca             T2DM, takes Metformin, Hgb a1c 6.9, TSH 0.96 07/2020, due for eye, foot exam.               HTN, takes Ramipril, Atorvastatin, Plavix             Tremor/Ataxia syndrome, frequent falls, last saw Neurology 03/22/20, MRI showed pathologic atrophy cerebellum             Nerocoognitive dysfunction/dementia, affecting frontal subcortical abilities.   Urinary frequency, takes Vesicare.   Past Medical History:  Diagnosis Date   Diabetes mellitus    Dyslipidemia    Hypertension    Osteoporosis    Urinary incontinence    History reviewed. No pertinent surgical history.  Allergies  Allergen Reactions   Amoxicillin-Pot Clavulanate Hives   Meloxicam     Unknown reaction   Scallops [Shellfish Allergy] Other (See Comments)    "I don't feel so good after an hour"   Toviaz [Fesoterodine Fumarate Er]     Unknown reaction    Allergies as of 08/20/2020      Reactions   Amoxicillin-pot Clavulanate Hives   Meloxicam    Unknown reaction   Scallops [shellfish Allergy] Other (See Comments)   "I don't feel so good after an hour"   Toviaz [fesoterodine Fumarate Er]    Unknown reaction      Medication List       Accurate as of August 20, 2020 11:59 PM. If you have any questions, ask  your nurse or doctor.        alendronate 70 MG tablet Commonly known as: FOSAMAX Take 70 mg by mouth once a week. Sundays   atorvastatin 40 MG tablet Commonly known as: LIPITOR Take 1 tablet (40 mg total) by mouth daily.   CALTRATE 600+D PO Take 1 tablet by mouth 2 (two) times daily.   CENTRUM SILVER PO Take 1 tablet by mouth daily.   clopidogrel 75 MG tablet Commonly known as: PLAVIX Take 75 mg by mouth daily.   metFORMIN 500 MG tablet Commonly known as: GLUCOPHAGE Take 500 mg by mouth 2 (two) times daily with a meal.   oxyCODONE 5 MG immediate release tablet Commonly known as: Roxicodone Take 1 tablet (5 mg total) by mouth every 4 (four) hours as needed for severe pain.   ramipril 2.5 MG capsule Commonly known as: ALTACE Take 2.5 mg by mouth daily.   solifenacin 10 MG  tablet Commonly known as: VESICARE Take 10 mg by mouth daily.   VITAMIN D PO Take 1 tablet by mouth daily. 1,000 units   zinc oxide 20 % ointment Apply 1 application topically as needed for irritation. To buttocks after every incontinent episode and as needed for redness. May keep at bedside.       Review of Systems  Constitutional: Negative for fatigue, fever and unexpected weight change.  HENT: Negative for congestion, trouble swallowing and voice change.   Eyes: Negative for visual disturbance.  Respiratory: Negative for shortness of breath and wheezing.   Cardiovascular: Negative for chest pain, palpitations and leg swelling.  Gastrointestinal: Negative for abdominal pain and constipation.  Genitourinary: Positive for frequency. Negative for difficulty urinating.  Musculoskeletal: Positive for arthralgias and gait problem. Negative for joint swelling.  Skin: Negative for color change.  Neurological: Positive for tremors. Negative for facial asymmetry, speech difficulty, weakness and headaches.  Psychiatric/Behavioral: Negative for behavioral problems, hallucinations and sleep disturbance. The patient is not nervous/anxious.     Immunization History  Administered Date(s) Administered   Influenza Inj Mdck Quad Pf 08/10/2019   Influenza-Unspecified 08/13/2020   Moderna SARS-COVID-2 Vaccination 12/11/2019, 01/08/2020   Zoster 07/01/2020   Pertinent  Health Maintenance Due  Topic Date Due   FOOT EXAM  Never done   OPHTHALMOLOGY EXAM  Never done   PNA vac Low Risk Adult (1 of 2 - PCV13) Never done   HEMOGLOBIN A1C  01/08/2021   INFLUENZA VACCINE  Completed   DEXA SCAN  Completed   Fall Risk  03/05/2020 09/06/2019  Falls in the past year? 1 1  Number falls in past yr: 0 0  Injury with Fall? 1 1   Functional Status Survey:    Vitals:   08/20/20 1017  BP: 96/64  Pulse: 67  Resp: 20  Temp: 97.6 F (36.4 C)  SpO2: 100%  Weight: 131 lb 1.6 oz (59.5 kg)    Height: 5\' 3"  (1.6 m)   Body mass index is 23.22 kg/m. Physical Exam Vitals and nursing note reviewed.  Constitutional:      Appearance: Normal appearance. She is normal weight.  HENT:     Head: Normocephalic.     Comments: Right face bruise, Zygomatic arch fx    Mouth/Throat:     Mouth: Mucous membranes are moist.  Eyes:     Extraocular Movements: Extraocular movements intact.     Conjunctiva/sclera: Conjunctivae normal.     Pupils: Pupils are equal, round, and reactive to light.  Cardiovascular:     Rate  and Rhythm: Normal rate and regular rhythm.     Heart sounds: No murmur heard.   Pulmonary:     Effort: Pulmonary effort is normal.     Breath sounds: No rales.  Abdominal:     General: Bowel sounds are normal.     Palpations: Abdomen is soft.     Tenderness: There is no abdominal tenderness. There is no rebound.  Musculoskeletal:        General: No tenderness.     Cervical back: Normal range of motion and neck supple.     Right lower leg: No edema.     Left lower leg: No edema.     Comments: Right shoulder in sling, pain is improved.   Skin:    General: Skin is warm and dry.     Findings: No bruising.  Neurological:     General: No focal deficit present.     Mental Status: She is alert and oriented to person, place, and time. Mental status is at baseline.     Motor: No weakness.     Coordination: Coordination abnormal.     Gait: Gait abnormal.  Psychiatric:        Mood and Affect: Mood normal.        Behavior: Behavior normal.        Thought Content: Thought content normal.     Labs reviewed: Recent Labs    07/10/20 1105 07/11/20 0000  NA 136 138  K 4.2 4.2  CL 100 104  CO2 22 23*  GLUCOSE 187*  --   BUN 21 21  CREATININE 0.94 1.0  CALCIUM 9.3 9.1   Recent Labs    07/11/20 0000  AST 14  ALT 10  ALKPHOS 53  ALBUMIN 3.5   Recent Labs    07/10/20 1105 07/11/20 0000  WBC 10.2 6.4  NEUTROABS 8.1* 3,725  HGB 12.5 12.1  HCT 37.4 36  MCV  93.3  --   PLT 251 257   Lab Results  Component Value Date   TSH 0.96 07/11/2020   Lab Results  Component Value Date   HGBA1C 6.9 07/11/2020   Lab Results  Component Value Date   CHOL 145 08/20/2019   HDL 32 (L) 08/20/2019   LDLCALC 86 08/20/2019   TRIG 133 08/20/2019   CHOLHDL 4.5 08/20/2019    Significant Diagnostic Results in last 30 days:  No results found.  Assessment/Plan Hypertension Blood pressure rungs low, but the patient denied headache, generalized malaise, change of vision, chest pian/pressure. Will continue Ramipril 2.5mg  qd,  Atorvastatin, Plavix   T2DM (type 2 diabetes mellitus) (HCC) T2DM, takes Metformin, Hgb a1c 6.9, TSH 0.96 07/2020, due for eye, foot exam.    Closed fracture of zygomatic complex, initial encounter [facility]) ED eval 07/06/20 for fall, closed fracture of right zygomatic arch-f/u X-ray [facility] showed no acute signs of fx. Healing nicely.   Nondisplaced fracture of proximal phalanx of right lesser toe(s), initial encounter for closed fracture ED eval 07/06/20 for fall, a close displaced fx of proximal end of right humerus. Sling/swath right arm.  Tylenol, Oxycodone for pain. Healing nicely.    Osteoporosis OP, takes Alendronate, Vit D, Ca  Urinary incontinence Managed, continue Vesicare  Neurocognitive disorder Nerocoognitive dysfunction/dementia, affecting frontal subcortical abilities.    Ataxia Tremor/Ataxia syndrome, frequent falls, last saw Neurology 03/22/20, MRI showed pathologic atrophy cerebellum      Family/ staff Communication: plan of care reviewed with the patient and charge nurse.   Labs/tests ordered:  none  Time spend 35 minutes.

## 2020-08-20 NOTE — Assessment & Plan Note (Signed)
ED eval 07/06/20 for fall, closed fracture of right zygomatic arch-f/u X-ray [facility] showed no acute signs of fx. Healing nicely.

## 2020-08-21 ENCOUNTER — Encounter: Payer: Self-pay | Admitting: Nurse Practitioner

## 2020-08-21 DIAGNOSIS — M6281 Muscle weakness (generalized): Secondary | ICD-10-CM | POA: Diagnosis not present

## 2020-08-21 DIAGNOSIS — S02402D Zygomatic fracture, unspecified, subsequent encounter for fracture with routine healing: Secondary | ICD-10-CM | POA: Diagnosis not present

## 2020-08-21 DIAGNOSIS — R2681 Unsteadiness on feet: Secondary | ICD-10-CM | POA: Diagnosis not present

## 2020-08-21 DIAGNOSIS — Z9181 History of falling: Secondary | ICD-10-CM | POA: Diagnosis not present

## 2020-08-21 DIAGNOSIS — G459 Transient cerebral ischemic attack, unspecified: Secondary | ICD-10-CM | POA: Diagnosis not present

## 2020-08-21 DIAGNOSIS — R1311 Dysphagia, oral phase: Secondary | ICD-10-CM | POA: Diagnosis not present

## 2020-08-21 DIAGNOSIS — S02401D Maxillary fracture, unspecified, subsequent encounter for fracture with routine healing: Secondary | ICD-10-CM | POA: Diagnosis not present

## 2020-08-21 DIAGNOSIS — R296 Repeated falls: Secondary | ICD-10-CM | POA: Diagnosis not present

## 2020-08-22 DIAGNOSIS — S02401D Maxillary fracture, unspecified, subsequent encounter for fracture with routine healing: Secondary | ICD-10-CM | POA: Diagnosis not present

## 2020-08-22 DIAGNOSIS — Z9181 History of falling: Secondary | ICD-10-CM | POA: Diagnosis not present

## 2020-08-22 DIAGNOSIS — R2681 Unsteadiness on feet: Secondary | ICD-10-CM | POA: Diagnosis not present

## 2020-08-22 DIAGNOSIS — R1311 Dysphagia, oral phase: Secondary | ICD-10-CM | POA: Diagnosis not present

## 2020-08-22 DIAGNOSIS — S02402D Zygomatic fracture, unspecified, subsequent encounter for fracture with routine healing: Secondary | ICD-10-CM | POA: Diagnosis not present

## 2020-08-22 DIAGNOSIS — R296 Repeated falls: Secondary | ICD-10-CM | POA: Diagnosis not present

## 2020-08-22 DIAGNOSIS — M6281 Muscle weakness (generalized): Secondary | ICD-10-CM | POA: Diagnosis not present

## 2020-08-22 DIAGNOSIS — G459 Transient cerebral ischemic attack, unspecified: Secondary | ICD-10-CM | POA: Diagnosis not present

## 2020-08-23 DIAGNOSIS — R296 Repeated falls: Secondary | ICD-10-CM | POA: Diagnosis not present

## 2020-08-23 DIAGNOSIS — M6281 Muscle weakness (generalized): Secondary | ICD-10-CM | POA: Diagnosis not present

## 2020-08-23 DIAGNOSIS — S02402D Zygomatic fracture, unspecified, subsequent encounter for fracture with routine healing: Secondary | ICD-10-CM | POA: Diagnosis not present

## 2020-08-23 DIAGNOSIS — S42294D Other nondisplaced fracture of upper end of right humerus, subsequent encounter for fracture with routine healing: Secondary | ICD-10-CM | POA: Diagnosis not present

## 2020-08-23 DIAGNOSIS — R1311 Dysphagia, oral phase: Secondary | ICD-10-CM | POA: Diagnosis not present

## 2020-08-23 DIAGNOSIS — S02401D Maxillary fracture, unspecified, subsequent encounter for fracture with routine healing: Secondary | ICD-10-CM | POA: Diagnosis not present

## 2020-08-23 DIAGNOSIS — G459 Transient cerebral ischemic attack, unspecified: Secondary | ICD-10-CM | POA: Diagnosis not present

## 2020-08-23 DIAGNOSIS — Z9181 History of falling: Secondary | ICD-10-CM | POA: Diagnosis not present

## 2020-08-23 DIAGNOSIS — R2681 Unsteadiness on feet: Secondary | ICD-10-CM | POA: Diagnosis not present

## 2020-08-26 DIAGNOSIS — S02401D Maxillary fracture, unspecified, subsequent encounter for fracture with routine healing: Secondary | ICD-10-CM | POA: Diagnosis not present

## 2020-08-26 DIAGNOSIS — R1311 Dysphagia, oral phase: Secondary | ICD-10-CM | POA: Diagnosis not present

## 2020-08-26 DIAGNOSIS — G459 Transient cerebral ischemic attack, unspecified: Secondary | ICD-10-CM | POA: Diagnosis not present

## 2020-08-26 DIAGNOSIS — Z9181 History of falling: Secondary | ICD-10-CM | POA: Diagnosis not present

## 2020-08-26 DIAGNOSIS — R2681 Unsteadiness on feet: Secondary | ICD-10-CM | POA: Diagnosis not present

## 2020-08-26 DIAGNOSIS — S02402D Zygomatic fracture, unspecified, subsequent encounter for fracture with routine healing: Secondary | ICD-10-CM | POA: Diagnosis not present

## 2020-08-26 DIAGNOSIS — M6281 Muscle weakness (generalized): Secondary | ICD-10-CM | POA: Diagnosis not present

## 2020-08-26 DIAGNOSIS — R296 Repeated falls: Secondary | ICD-10-CM | POA: Diagnosis not present

## 2020-08-28 DIAGNOSIS — R1311 Dysphagia, oral phase: Secondary | ICD-10-CM | POA: Diagnosis not present

## 2020-08-28 DIAGNOSIS — S02402D Zygomatic fracture, unspecified, subsequent encounter for fracture with routine healing: Secondary | ICD-10-CM | POA: Diagnosis not present

## 2020-08-28 DIAGNOSIS — S02401D Maxillary fracture, unspecified, subsequent encounter for fracture with routine healing: Secondary | ICD-10-CM | POA: Diagnosis not present

## 2020-08-28 DIAGNOSIS — Z9181 History of falling: Secondary | ICD-10-CM | POA: Diagnosis not present

## 2020-08-28 DIAGNOSIS — M6281 Muscle weakness (generalized): Secondary | ICD-10-CM | POA: Diagnosis not present

## 2020-08-28 DIAGNOSIS — R296 Repeated falls: Secondary | ICD-10-CM | POA: Diagnosis not present

## 2020-08-28 DIAGNOSIS — R2681 Unsteadiness on feet: Secondary | ICD-10-CM | POA: Diagnosis not present

## 2020-08-28 DIAGNOSIS — G459 Transient cerebral ischemic attack, unspecified: Secondary | ICD-10-CM | POA: Diagnosis not present

## 2020-08-29 DIAGNOSIS — Z9181 History of falling: Secondary | ICD-10-CM | POA: Diagnosis not present

## 2020-08-29 DIAGNOSIS — S02402D Zygomatic fracture, unspecified, subsequent encounter for fracture with routine healing: Secondary | ICD-10-CM | POA: Diagnosis not present

## 2020-08-29 DIAGNOSIS — M6281 Muscle weakness (generalized): Secondary | ICD-10-CM | POA: Diagnosis not present

## 2020-08-29 DIAGNOSIS — R296 Repeated falls: Secondary | ICD-10-CM | POA: Diagnosis not present

## 2020-08-29 DIAGNOSIS — G459 Transient cerebral ischemic attack, unspecified: Secondary | ICD-10-CM | POA: Diagnosis not present

## 2020-08-29 DIAGNOSIS — R2681 Unsteadiness on feet: Secondary | ICD-10-CM | POA: Diagnosis not present

## 2020-08-29 DIAGNOSIS — R1311 Dysphagia, oral phase: Secondary | ICD-10-CM | POA: Diagnosis not present

## 2020-08-29 DIAGNOSIS — S02401D Maxillary fracture, unspecified, subsequent encounter for fracture with routine healing: Secondary | ICD-10-CM | POA: Diagnosis not present

## 2020-08-30 DIAGNOSIS — R2681 Unsteadiness on feet: Secondary | ICD-10-CM | POA: Diagnosis not present

## 2020-08-30 DIAGNOSIS — S02402D Zygomatic fracture, unspecified, subsequent encounter for fracture with routine healing: Secondary | ICD-10-CM | POA: Diagnosis not present

## 2020-08-30 DIAGNOSIS — R1311 Dysphagia, oral phase: Secondary | ICD-10-CM | POA: Diagnosis not present

## 2020-08-30 DIAGNOSIS — G459 Transient cerebral ischemic attack, unspecified: Secondary | ICD-10-CM | POA: Diagnosis not present

## 2020-08-30 DIAGNOSIS — M6281 Muscle weakness (generalized): Secondary | ICD-10-CM | POA: Diagnosis not present

## 2020-08-30 DIAGNOSIS — Z9181 History of falling: Secondary | ICD-10-CM | POA: Diagnosis not present

## 2020-08-30 DIAGNOSIS — S02401D Maxillary fracture, unspecified, subsequent encounter for fracture with routine healing: Secondary | ICD-10-CM | POA: Diagnosis not present

## 2020-08-30 DIAGNOSIS — R296 Repeated falls: Secondary | ICD-10-CM | POA: Diagnosis not present

## 2020-09-02 DIAGNOSIS — R2681 Unsteadiness on feet: Secondary | ICD-10-CM | POA: Diagnosis not present

## 2020-09-02 DIAGNOSIS — S02401D Maxillary fracture, unspecified, subsequent encounter for fracture with routine healing: Secondary | ICD-10-CM | POA: Diagnosis not present

## 2020-09-02 DIAGNOSIS — Z9181 History of falling: Secondary | ICD-10-CM | POA: Diagnosis not present

## 2020-09-02 DIAGNOSIS — S02402D Zygomatic fracture, unspecified, subsequent encounter for fracture with routine healing: Secondary | ICD-10-CM | POA: Diagnosis not present

## 2020-09-02 DIAGNOSIS — G459 Transient cerebral ischemic attack, unspecified: Secondary | ICD-10-CM | POA: Diagnosis not present

## 2020-09-02 DIAGNOSIS — M6281 Muscle weakness (generalized): Secondary | ICD-10-CM | POA: Diagnosis not present

## 2020-09-02 DIAGNOSIS — R1311 Dysphagia, oral phase: Secondary | ICD-10-CM | POA: Diagnosis not present

## 2020-09-02 DIAGNOSIS — R296 Repeated falls: Secondary | ICD-10-CM | POA: Diagnosis not present

## 2020-09-03 DIAGNOSIS — M6281 Muscle weakness (generalized): Secondary | ICD-10-CM | POA: Diagnosis not present

## 2020-09-03 DIAGNOSIS — S02402D Zygomatic fracture, unspecified, subsequent encounter for fracture with routine healing: Secondary | ICD-10-CM | POA: Diagnosis not present

## 2020-09-03 DIAGNOSIS — S02401D Maxillary fracture, unspecified, subsequent encounter for fracture with routine healing: Secondary | ICD-10-CM | POA: Diagnosis not present

## 2020-09-03 DIAGNOSIS — R296 Repeated falls: Secondary | ICD-10-CM | POA: Diagnosis not present

## 2020-09-03 DIAGNOSIS — R2681 Unsteadiness on feet: Secondary | ICD-10-CM | POA: Diagnosis not present

## 2020-09-03 DIAGNOSIS — Z9181 History of falling: Secondary | ICD-10-CM | POA: Diagnosis not present

## 2020-09-03 DIAGNOSIS — R1311 Dysphagia, oral phase: Secondary | ICD-10-CM | POA: Diagnosis not present

## 2020-09-03 DIAGNOSIS — G459 Transient cerebral ischemic attack, unspecified: Secondary | ICD-10-CM | POA: Diagnosis not present

## 2020-09-04 DIAGNOSIS — S02401D Maxillary fracture, unspecified, subsequent encounter for fracture with routine healing: Secondary | ICD-10-CM | POA: Diagnosis not present

## 2020-09-04 DIAGNOSIS — Z9181 History of falling: Secondary | ICD-10-CM | POA: Diagnosis not present

## 2020-09-04 DIAGNOSIS — R1311 Dysphagia, oral phase: Secondary | ICD-10-CM | POA: Diagnosis not present

## 2020-09-04 DIAGNOSIS — G459 Transient cerebral ischemic attack, unspecified: Secondary | ICD-10-CM | POA: Diagnosis not present

## 2020-09-04 DIAGNOSIS — R2681 Unsteadiness on feet: Secondary | ICD-10-CM | POA: Diagnosis not present

## 2020-09-04 DIAGNOSIS — R296 Repeated falls: Secondary | ICD-10-CM | POA: Diagnosis not present

## 2020-09-04 DIAGNOSIS — S02402D Zygomatic fracture, unspecified, subsequent encounter for fracture with routine healing: Secondary | ICD-10-CM | POA: Diagnosis not present

## 2020-09-04 DIAGNOSIS — M6281 Muscle weakness (generalized): Secondary | ICD-10-CM | POA: Diagnosis not present

## 2020-09-05 DIAGNOSIS — S02402D Zygomatic fracture, unspecified, subsequent encounter for fracture with routine healing: Secondary | ICD-10-CM | POA: Diagnosis not present

## 2020-09-05 DIAGNOSIS — M6281 Muscle weakness (generalized): Secondary | ICD-10-CM | POA: Diagnosis not present

## 2020-09-05 DIAGNOSIS — R2681 Unsteadiness on feet: Secondary | ICD-10-CM | POA: Diagnosis not present

## 2020-09-05 DIAGNOSIS — R296 Repeated falls: Secondary | ICD-10-CM | POA: Diagnosis not present

## 2020-09-05 DIAGNOSIS — G459 Transient cerebral ischemic attack, unspecified: Secondary | ICD-10-CM | POA: Diagnosis not present

## 2020-09-05 DIAGNOSIS — Z9181 History of falling: Secondary | ICD-10-CM | POA: Diagnosis not present

## 2020-09-05 DIAGNOSIS — S02401D Maxillary fracture, unspecified, subsequent encounter for fracture with routine healing: Secondary | ICD-10-CM | POA: Diagnosis not present

## 2020-09-05 DIAGNOSIS — R1311 Dysphagia, oral phase: Secondary | ICD-10-CM | POA: Diagnosis not present

## 2020-09-06 DIAGNOSIS — M6281 Muscle weakness (generalized): Secondary | ICD-10-CM | POA: Diagnosis not present

## 2020-09-06 DIAGNOSIS — S02402D Zygomatic fracture, unspecified, subsequent encounter for fracture with routine healing: Secondary | ICD-10-CM | POA: Diagnosis not present

## 2020-09-06 DIAGNOSIS — M2040 Other hammer toe(s) (acquired), unspecified foot: Secondary | ICD-10-CM | POA: Diagnosis not present

## 2020-09-06 DIAGNOSIS — R1311 Dysphagia, oral phase: Secondary | ICD-10-CM | POA: Diagnosis not present

## 2020-09-06 DIAGNOSIS — L600 Ingrowing nail: Secondary | ICD-10-CM | POA: Diagnosis not present

## 2020-09-06 DIAGNOSIS — G459 Transient cerebral ischemic attack, unspecified: Secondary | ICD-10-CM | POA: Diagnosis not present

## 2020-09-06 DIAGNOSIS — S02401D Maxillary fracture, unspecified, subsequent encounter for fracture with routine healing: Secondary | ICD-10-CM | POA: Diagnosis not present

## 2020-09-06 DIAGNOSIS — R2681 Unsteadiness on feet: Secondary | ICD-10-CM | POA: Diagnosis not present

## 2020-09-06 DIAGNOSIS — Z9181 History of falling: Secondary | ICD-10-CM | POA: Diagnosis not present

## 2020-09-06 DIAGNOSIS — R296 Repeated falls: Secondary | ICD-10-CM | POA: Diagnosis not present

## 2020-09-09 DIAGNOSIS — S0230XD Fracture of orbital floor, unspecified side, subsequent encounter for fracture with routine healing: Secondary | ICD-10-CM | POA: Diagnosis not present

## 2020-09-09 DIAGNOSIS — S02402D Zygomatic fracture, unspecified, subsequent encounter for fracture with routine healing: Secondary | ICD-10-CM | POA: Diagnosis not present

## 2020-09-09 DIAGNOSIS — Z9181 History of falling: Secondary | ICD-10-CM | POA: Diagnosis not present

## 2020-09-09 DIAGNOSIS — S0280XD Fracture of other specified skull and facial bones, unspecified side, subsequent encounter for fracture with routine healing: Secondary | ICD-10-CM | POA: Diagnosis not present

## 2020-09-09 DIAGNOSIS — S02401D Maxillary fracture, unspecified, subsequent encounter for fracture with routine healing: Secondary | ICD-10-CM | POA: Diagnosis not present

## 2020-09-09 DIAGNOSIS — R2681 Unsteadiness on feet: Secondary | ICD-10-CM | POA: Diagnosis not present

## 2020-09-09 DIAGNOSIS — M6281 Muscle weakness (generalized): Secondary | ICD-10-CM | POA: Diagnosis not present

## 2020-09-09 DIAGNOSIS — R296 Repeated falls: Secondary | ICD-10-CM | POA: Diagnosis not present

## 2020-09-10 DIAGNOSIS — S02401D Maxillary fracture, unspecified, subsequent encounter for fracture with routine healing: Secondary | ICD-10-CM | POA: Diagnosis not present

## 2020-09-10 DIAGNOSIS — M6281 Muscle weakness (generalized): Secondary | ICD-10-CM | POA: Diagnosis not present

## 2020-09-10 DIAGNOSIS — S02402D Zygomatic fracture, unspecified, subsequent encounter for fracture with routine healing: Secondary | ICD-10-CM | POA: Diagnosis not present

## 2020-09-10 DIAGNOSIS — Z9181 History of falling: Secondary | ICD-10-CM | POA: Diagnosis not present

## 2020-09-10 DIAGNOSIS — S0230XD Fracture of orbital floor, unspecified side, subsequent encounter for fracture with routine healing: Secondary | ICD-10-CM | POA: Diagnosis not present

## 2020-09-10 DIAGNOSIS — S0280XD Fracture of other specified skull and facial bones, unspecified side, subsequent encounter for fracture with routine healing: Secondary | ICD-10-CM | POA: Diagnosis not present

## 2020-09-10 DIAGNOSIS — R296 Repeated falls: Secondary | ICD-10-CM | POA: Diagnosis not present

## 2020-09-10 DIAGNOSIS — R2681 Unsteadiness on feet: Secondary | ICD-10-CM | POA: Diagnosis not present

## 2020-09-11 DIAGNOSIS — S0280XD Fracture of other specified skull and facial bones, unspecified side, subsequent encounter for fracture with routine healing: Secondary | ICD-10-CM | POA: Diagnosis not present

## 2020-09-11 DIAGNOSIS — S02402D Zygomatic fracture, unspecified, subsequent encounter for fracture with routine healing: Secondary | ICD-10-CM | POA: Diagnosis not present

## 2020-09-11 DIAGNOSIS — S02401D Maxillary fracture, unspecified, subsequent encounter for fracture with routine healing: Secondary | ICD-10-CM | POA: Diagnosis not present

## 2020-09-11 DIAGNOSIS — S0230XD Fracture of orbital floor, unspecified side, subsequent encounter for fracture with routine healing: Secondary | ICD-10-CM | POA: Diagnosis not present

## 2020-09-11 DIAGNOSIS — M6281 Muscle weakness (generalized): Secondary | ICD-10-CM | POA: Diagnosis not present

## 2020-09-11 DIAGNOSIS — Z9181 History of falling: Secondary | ICD-10-CM | POA: Diagnosis not present

## 2020-09-11 DIAGNOSIS — R296 Repeated falls: Secondary | ICD-10-CM | POA: Diagnosis not present

## 2020-09-11 DIAGNOSIS — R2681 Unsteadiness on feet: Secondary | ICD-10-CM | POA: Diagnosis not present

## 2020-09-12 DIAGNOSIS — S0230XD Fracture of orbital floor, unspecified side, subsequent encounter for fracture with routine healing: Secondary | ICD-10-CM | POA: Diagnosis not present

## 2020-09-12 DIAGNOSIS — R2681 Unsteadiness on feet: Secondary | ICD-10-CM | POA: Diagnosis not present

## 2020-09-12 DIAGNOSIS — R296 Repeated falls: Secondary | ICD-10-CM | POA: Diagnosis not present

## 2020-09-12 DIAGNOSIS — S02402D Zygomatic fracture, unspecified, subsequent encounter for fracture with routine healing: Secondary | ICD-10-CM | POA: Diagnosis not present

## 2020-09-12 DIAGNOSIS — M6281 Muscle weakness (generalized): Secondary | ICD-10-CM | POA: Diagnosis not present

## 2020-09-12 DIAGNOSIS — S02401D Maxillary fracture, unspecified, subsequent encounter for fracture with routine healing: Secondary | ICD-10-CM | POA: Diagnosis not present

## 2020-09-12 DIAGNOSIS — Z9181 History of falling: Secondary | ICD-10-CM | POA: Diagnosis not present

## 2020-09-12 DIAGNOSIS — S0280XD Fracture of other specified skull and facial bones, unspecified side, subsequent encounter for fracture with routine healing: Secondary | ICD-10-CM | POA: Diagnosis not present

## 2020-09-13 DIAGNOSIS — M6281 Muscle weakness (generalized): Secondary | ICD-10-CM | POA: Diagnosis not present

## 2020-09-13 DIAGNOSIS — S02401D Maxillary fracture, unspecified, subsequent encounter for fracture with routine healing: Secondary | ICD-10-CM | POA: Diagnosis not present

## 2020-09-13 DIAGNOSIS — S0280XD Fracture of other specified skull and facial bones, unspecified side, subsequent encounter for fracture with routine healing: Secondary | ICD-10-CM | POA: Diagnosis not present

## 2020-09-13 DIAGNOSIS — R296 Repeated falls: Secondary | ICD-10-CM | POA: Diagnosis not present

## 2020-09-13 DIAGNOSIS — R2681 Unsteadiness on feet: Secondary | ICD-10-CM | POA: Diagnosis not present

## 2020-09-13 DIAGNOSIS — S0230XD Fracture of orbital floor, unspecified side, subsequent encounter for fracture with routine healing: Secondary | ICD-10-CM | POA: Diagnosis not present

## 2020-09-13 DIAGNOSIS — S02402D Zygomatic fracture, unspecified, subsequent encounter for fracture with routine healing: Secondary | ICD-10-CM | POA: Diagnosis not present

## 2020-09-13 DIAGNOSIS — Z9181 History of falling: Secondary | ICD-10-CM | POA: Diagnosis not present

## 2020-09-16 DIAGNOSIS — S02402D Zygomatic fracture, unspecified, subsequent encounter for fracture with routine healing: Secondary | ICD-10-CM | POA: Diagnosis not present

## 2020-09-16 DIAGNOSIS — Z9181 History of falling: Secondary | ICD-10-CM | POA: Diagnosis not present

## 2020-09-16 DIAGNOSIS — R2681 Unsteadiness on feet: Secondary | ICD-10-CM | POA: Diagnosis not present

## 2020-09-16 DIAGNOSIS — R296 Repeated falls: Secondary | ICD-10-CM | POA: Diagnosis not present

## 2020-09-16 DIAGNOSIS — M6281 Muscle weakness (generalized): Secondary | ICD-10-CM | POA: Diagnosis not present

## 2020-09-16 DIAGNOSIS — S0280XD Fracture of other specified skull and facial bones, unspecified side, subsequent encounter for fracture with routine healing: Secondary | ICD-10-CM | POA: Diagnosis not present

## 2020-09-16 DIAGNOSIS — S02401D Maxillary fracture, unspecified, subsequent encounter for fracture with routine healing: Secondary | ICD-10-CM | POA: Diagnosis not present

## 2020-09-16 DIAGNOSIS — S0230XD Fracture of orbital floor, unspecified side, subsequent encounter for fracture with routine healing: Secondary | ICD-10-CM | POA: Diagnosis not present

## 2020-09-17 DIAGNOSIS — R2681 Unsteadiness on feet: Secondary | ICD-10-CM | POA: Diagnosis not present

## 2020-09-17 DIAGNOSIS — R296 Repeated falls: Secondary | ICD-10-CM | POA: Diagnosis not present

## 2020-09-17 DIAGNOSIS — S02402D Zygomatic fracture, unspecified, subsequent encounter for fracture with routine healing: Secondary | ICD-10-CM | POA: Diagnosis not present

## 2020-09-17 DIAGNOSIS — S0280XD Fracture of other specified skull and facial bones, unspecified side, subsequent encounter for fracture with routine healing: Secondary | ICD-10-CM | POA: Diagnosis not present

## 2020-09-17 DIAGNOSIS — Z9181 History of falling: Secondary | ICD-10-CM | POA: Diagnosis not present

## 2020-09-17 DIAGNOSIS — S02401D Maxillary fracture, unspecified, subsequent encounter for fracture with routine healing: Secondary | ICD-10-CM | POA: Diagnosis not present

## 2020-09-17 DIAGNOSIS — M6281 Muscle weakness (generalized): Secondary | ICD-10-CM | POA: Diagnosis not present

## 2020-09-17 DIAGNOSIS — S0230XD Fracture of orbital floor, unspecified side, subsequent encounter for fracture with routine healing: Secondary | ICD-10-CM | POA: Diagnosis not present

## 2020-09-19 DIAGNOSIS — Z9181 History of falling: Secondary | ICD-10-CM | POA: Diagnosis not present

## 2020-09-19 DIAGNOSIS — S0230XD Fracture of orbital floor, unspecified side, subsequent encounter for fracture with routine healing: Secondary | ICD-10-CM | POA: Diagnosis not present

## 2020-09-19 DIAGNOSIS — S02402D Zygomatic fracture, unspecified, subsequent encounter for fracture with routine healing: Secondary | ICD-10-CM | POA: Diagnosis not present

## 2020-09-19 DIAGNOSIS — R296 Repeated falls: Secondary | ICD-10-CM | POA: Diagnosis not present

## 2020-09-19 DIAGNOSIS — S02401D Maxillary fracture, unspecified, subsequent encounter for fracture with routine healing: Secondary | ICD-10-CM | POA: Diagnosis not present

## 2020-09-19 DIAGNOSIS — S0280XD Fracture of other specified skull and facial bones, unspecified side, subsequent encounter for fracture with routine healing: Secondary | ICD-10-CM | POA: Diagnosis not present

## 2020-09-19 DIAGNOSIS — R2681 Unsteadiness on feet: Secondary | ICD-10-CM | POA: Diagnosis not present

## 2020-09-19 DIAGNOSIS — M6281 Muscle weakness (generalized): Secondary | ICD-10-CM | POA: Diagnosis not present

## 2020-09-20 DIAGNOSIS — S42294D Other nondisplaced fracture of upper end of right humerus, subsequent encounter for fracture with routine healing: Secondary | ICD-10-CM | POA: Diagnosis not present

## 2020-09-20 DIAGNOSIS — Z9181 History of falling: Secondary | ICD-10-CM | POA: Diagnosis not present

## 2020-09-20 DIAGNOSIS — R2681 Unsteadiness on feet: Secondary | ICD-10-CM | POA: Diagnosis not present

## 2020-09-20 DIAGNOSIS — R296 Repeated falls: Secondary | ICD-10-CM | POA: Diagnosis not present

## 2020-09-20 DIAGNOSIS — S0280XD Fracture of other specified skull and facial bones, unspecified side, subsequent encounter for fracture with routine healing: Secondary | ICD-10-CM | POA: Diagnosis not present

## 2020-09-20 DIAGNOSIS — S02402D Zygomatic fracture, unspecified, subsequent encounter for fracture with routine healing: Secondary | ICD-10-CM | POA: Diagnosis not present

## 2020-09-20 DIAGNOSIS — S0230XD Fracture of orbital floor, unspecified side, subsequent encounter for fracture with routine healing: Secondary | ICD-10-CM | POA: Diagnosis not present

## 2020-09-20 DIAGNOSIS — M6281 Muscle weakness (generalized): Secondary | ICD-10-CM | POA: Diagnosis not present

## 2020-09-20 DIAGNOSIS — S02401D Maxillary fracture, unspecified, subsequent encounter for fracture with routine healing: Secondary | ICD-10-CM | POA: Diagnosis not present

## 2020-09-23 ENCOUNTER — Non-Acute Institutional Stay (SKILLED_NURSING_FACILITY): Payer: Medicare Other | Admitting: Nurse Practitioner

## 2020-09-23 ENCOUNTER — Encounter: Payer: Self-pay | Admitting: Nurse Practitioner

## 2020-09-23 DIAGNOSIS — S42294D Other nondisplaced fracture of upper end of right humerus, subsequent encounter for fracture with routine healing: Secondary | ICD-10-CM | POA: Diagnosis not present

## 2020-09-23 DIAGNOSIS — M810 Age-related osteoporosis without current pathological fracture: Secondary | ICD-10-CM

## 2020-09-23 DIAGNOSIS — S02401D Maxillary fracture, unspecified, subsequent encounter for fracture with routine healing: Secondary | ICD-10-CM | POA: Diagnosis not present

## 2020-09-23 DIAGNOSIS — S0230XD Fracture of orbital floor, unspecified side, subsequent encounter for fracture with routine healing: Secondary | ICD-10-CM | POA: Diagnosis not present

## 2020-09-23 DIAGNOSIS — R2681 Unsteadiness on feet: Secondary | ICD-10-CM | POA: Diagnosis not present

## 2020-09-23 DIAGNOSIS — R296 Repeated falls: Secondary | ICD-10-CM | POA: Diagnosis not present

## 2020-09-23 DIAGNOSIS — R270 Ataxia, unspecified: Secondary | ICD-10-CM | POA: Diagnosis not present

## 2020-09-23 DIAGNOSIS — R419 Unspecified symptoms and signs involving cognitive functions and awareness: Secondary | ICD-10-CM

## 2020-09-23 DIAGNOSIS — S02401A Maxillary fracture, unspecified, initial encounter for closed fracture: Secondary | ICD-10-CM

## 2020-09-23 DIAGNOSIS — S0280XD Fracture of other specified skull and facial bones, unspecified side, subsequent encounter for fracture with routine healing: Secondary | ICD-10-CM | POA: Diagnosis not present

## 2020-09-23 DIAGNOSIS — I10 Essential (primary) hypertension: Secondary | ICD-10-CM

## 2020-09-23 DIAGNOSIS — S02402D Zygomatic fracture, unspecified, subsequent encounter for fracture with routine healing: Secondary | ICD-10-CM | POA: Diagnosis not present

## 2020-09-23 DIAGNOSIS — S0230XA Fracture of orbital floor, unspecified side, initial encounter for closed fracture: Secondary | ICD-10-CM

## 2020-09-23 DIAGNOSIS — E1149 Type 2 diabetes mellitus with other diabetic neurological complication: Secondary | ICD-10-CM

## 2020-09-23 DIAGNOSIS — M6281 Muscle weakness (generalized): Secondary | ICD-10-CM | POA: Diagnosis not present

## 2020-09-23 DIAGNOSIS — R32 Unspecified urinary incontinence: Secondary | ICD-10-CM

## 2020-09-23 DIAGNOSIS — S02402A Zygomatic fracture, unspecified, initial encounter for closed fracture: Secondary | ICD-10-CM | POA: Diagnosis not present

## 2020-09-23 DIAGNOSIS — S0280XA Fracture of other specified skull and facial bones, unspecified side, initial encounter for closed fracture: Secondary | ICD-10-CM

## 2020-09-23 DIAGNOSIS — Z9181 History of falling: Secondary | ICD-10-CM | POA: Diagnosis not present

## 2020-09-23 NOTE — Progress Notes (Signed)
Location:    [facility] Room Number: 28 Place of Service:  SNF (31) Provider:  Sydell Axon, Arna Snipe NP  Mahlon Gammon, MD  Patient Care Team: Mahlon Gammon, MD as PCP - General (Internal Medicine) [name], DO as Consulting Physician (Neurology)  Extended Emergency Contact Information Primary Emergency Contact: [name],[name] Address: [address]          [place] [address] [place] Home Phone: [phone] Relation: Sister Secondary Emergency Contact: [facility] Address: [address] [place], [place] [address] [name] Mobile Phone: [phone] Relation: Brother  Code Status:  DNR Goals of care: Advanced Directive information Advanced Directives 08/20/2020  Does Patient Have a Medical Advance Directive? Yes  Type of Advance Directive Healthcare Power of Attorney  Does patient want to make changes to medical advance directive? No - Patient declined  Copy of Healthcare Power of Attorney in Chart? Yes - validated most recent copy scanned in chart (See row information)  Would patient like information on creating a medical advance directive? -  Pre-existing out of facility DNR order (yellow form or pink MOST form) Pink MOST form placed in chart (order not valid for inpatient use)     Chief Complaint  Patient presents with   Medical Management of Chronic Issues   Health Maintenance    Hep C screening, Foot and eye exam, TDAP, PCV13    HPI:  Pt is a 79 y.o. female seen today for medical management of chronic diseases.    ED eval 07/06/20 for fall, a close displaced fx of proximal end of right humerus-healing nicely, closed fracture of right zygomatic arch-healed. Tylenol, Oxycodone for pain. OP, takes Alendronate, Vit D, Ca T2DM, takes Metformin, Hgb a1c 6.9, TSH 0.96 07/2020, due for eye, foot exam.  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              Urinary frequency, takes Vesicare.    Past Medical History:  Diagnosis Date   Diabetes mellitus    Dyslipidemia    Hypertension    Osteoporosis    Urinary incontinence    History reviewed. No pertinent surgical history.  Allergies  Allergen Reactions   Amoxicillin-Pot Clavulanate Hives   Meloxicam     Unknown reaction   Scallops [Shellfish Allergy] Other (See Comments)    "I don't feel so good after an hour"   Toviaz [Fesoterodine Fumarate Er]     Unknown reaction    Allergies as of 09/23/2020      Reactions   Amoxicillin-pot Clavulanate Hives   Meloxicam    Unknown reaction   Scallops [shellfish Allergy] Other (See Comments)   "I don't feel so good after an hour"   Toviaz [fesoterodine Fumarate Er]    Unknown reaction      Medication List       Accurate as of September 23, 2020 11:59 PM. If you have any questions, ask your nurse or doctor.        alendronate 70 MG tablet Commonly known as: FOSAMAX Take 70 mg by mouth once a week. Sundays   atorvastatin 40 MG tablet Commonly known as: LIPITOR Take 1 tablet (40 mg total) by mouth daily.   CALTRATE 600+D PO Take 1 tablet by mouth 2 (two) times daily.   CENTRUM SILVER PO  Take 1 tablet by mouth daily.   clopidogrel 75 MG tablet Commonly known as: PLAVIX Take 75 mg by mouth daily.   metFORMIN 500 MG tablet Commonly known as: GLUCOPHAGE Take 500 mg by mouth 2 (two) times daily with a meal.   oxyCODONE 5 MG immediate release tablet Commonly known as: Roxicodone Take 1 tablet (5 mg total) by mouth every 4 (four) hours as needed for severe pain.   ramipril 2.5 MG capsule Commonly known as: ALTACE Take 2.5 mg by mouth daily.   sennosides-docusate sodium 8.6-50 MG tablet Commonly known as:  SENOKOT-S Take 1 tablet by mouth in the morning and at bedtime.   solifenacin 10 MG tablet Commonly known as: VESICARE Take 10 mg by mouth daily.   VITAMIN D PO Take 1 tablet by mouth daily. 1,000 units   zinc oxide 20 % ointment Apply 1 application topically as needed for irritation. To buttocks after every incontinent episode and as needed for redness. May keep at bedside.       Review of Systems  Constitutional: Negative for fatigue, fever and unexpected weight change.  HENT: Negative for congestion, trouble swallowing and voice change.   Eyes: Negative for visual disturbance.  Respiratory: Negative for shortness of breath and wheezing.   Cardiovascular: Negative for chest pain, palpitations and leg swelling.  Gastrointestinal: Negative for abdominal pain and constipation.  Genitourinary: Positive for frequency. Negative for difficulty urinating.  Musculoskeletal: Positive for arthralgias and gait problem. Negative for joint swelling.  Skin: Negative for color change.  Neurological: Positive for tremors. Negative for facial asymmetry, speech difficulty, weakness and headaches.  Psychiatric/Behavioral: Negative for behavioral problems, hallucinations and sleep disturbance. The patient is not nervous/anxious.     Immunization History  Administered Date(s) Administered   Influenza Inj Mdck Quad Pf 08/10/2019   Influenza-Unspecified 08/13/2020   Moderna SARS-COVID-2 Vaccination 12/11/2019, 01/08/2020   Zoster 07/01/2020   Pertinent  Health Maintenance Due  Topic Date Due   FOOT EXAM  Never done   OPHTHALMOLOGY EXAM  Never done   PNA vac Low Risk Adult (1 of 2 - PCV13) Never done   HEMOGLOBIN A1C  01/08/2021   INFLUENZA VACCINE  Completed   DEXA SCAN  Completed   Fall Risk  03/05/2020 09/06/2019  Falls in the past year? 1 1  Number falls in past yr: 0 0  Injury with Fall? 1 1   Functional Status Survey:    Vitals:   09/23/20 1151  BP: 102/60  Pulse: 64   Resp: 18  Temp: 97.6 F (36.4 C)  SpO2: 97%  Weight: 130 lb 12.8 oz (59.3 kg)  Height: 5\' 3"  (1.6 m)   Body mass index is 23.17 kg/m. Physical Exam Vitals and nursing note reviewed.  Constitutional:      Appearance: Normal appearance. She is normal weight.  HENT:     Head: Normocephalic.     Comments: Right face bruise, Zygomatic arch fx    Mouth/Throat:     Mouth: Mucous membranes are moist.  Eyes:     Extraocular Movements: Extraocular movements intact.     Conjunctiva/sclera: Conjunctivae normal.     Pupils: Pupils are equal, round, and reactive to light.  Cardiovascular:     Rate and Rhythm: Normal rate and regular rhythm.     Heart sounds: No murmur heard.   Pulmonary:     Effort: Pulmonary effort is normal.     Breath sounds: No rales.  Abdominal:     General: Bowel sounds  are normal.     Palpations: Abdomen is soft.     Tenderness: There is no abdominal tenderness. There is no rebound.  Musculoskeletal:        General: No tenderness.     Cervical back: Normal range of motion and neck supple.     Right lower leg: No edema.     Left lower leg: No edema.     Comments: Right shoulder ROM as tolerated.   Skin:    General: Skin is warm and dry.     Findings: No bruising.  Neurological:     General: No focal deficit present.     Mental Status: She is alert and oriented to person, place, and time. Mental status is at baseline.     Motor: No weakness.     Coordination: Coordination abnormal.     Gait: Gait abnormal.  Psychiatric:        Mood and Affect: Mood normal.        Behavior: Behavior normal.        Thought Content: Thought content normal.     Labs reviewed: Recent Labs    07/10/20 1105 07/11/20 0000  NA 136 138  K 4.2 4.2  CL 100 104  CO2 22 23*  GLUCOSE 187*  --   BUN 21 21  CREATININE 0.94 1.0  CALCIUM 9.3 9.1   Recent Labs    07/11/20 0000  AST 14  ALT 10  ALKPHOS 53  ALBUMIN 3.5   Recent Labs    07/10/20 1105 07/11/20 0000   WBC 10.2 6.4  NEUTROABS 8.1* 3,725  HGB 12.5 12.1  HCT 37.4 36  MCV 93.3  --   PLT 251 257   Lab Results  Component Value Date   TSH 0.96 07/11/2020   Lab Results  Component Value Date   HGBA1C 6.9 07/11/2020   Lab Results  Component Value Date   CHOL 145 08/20/2019   HDL 32 (L) 08/20/2019   LDLCALC 86 08/20/2019   TRIG 133 08/20/2019   CHOLHDL 4.5 08/20/2019    Significant Diagnostic Results in last 30 days:  No results found.  Assessment/Plan Hypertension HTN, takes Ramipril, Atorvastatin, Plavix   Closed fracture of right proximal humerus Healing nicely, saw Ortho 10/15/2: ROM as tolerated, weight limit to 2 Ibs.   Closed fracture of zygomatic complex, initial encounter (HCC) Healed.   Ataxia Tremor/Ataxia syndrome, frequent falls, last saw Neurology 03/22/20, MRI showed pathologic atrophy cerebellum   Urinary incontinence No change, continue Vesicare  Neurocognitive disorder Nerocoognitive dysfunction/dementia, affecting frontal subcortical abilities.   T2DM (type 2 diabetes mellitus) (HCC) T2DM, takes Metformin, Hgb a1c 6.9, TSH 0.96 07/2020, due for eye, foot exam   Osteoporosis OP, takes Alendronate, Vit D, Ca     Family/ staff Communication: plan of care reviewed with the patient and charge nurse.   Labs/tests ordered:  none  Time spend 35 minutes.

## 2020-09-24 ENCOUNTER — Encounter: Payer: Self-pay | Admitting: Nurse Practitioner

## 2020-09-24 DIAGNOSIS — S0280XD Fracture of other specified skull and facial bones, unspecified side, subsequent encounter for fracture with routine healing: Secondary | ICD-10-CM | POA: Diagnosis not present

## 2020-09-24 DIAGNOSIS — Z9181 History of falling: Secondary | ICD-10-CM | POA: Diagnosis not present

## 2020-09-24 DIAGNOSIS — S02402D Zygomatic fracture, unspecified, subsequent encounter for fracture with routine healing: Secondary | ICD-10-CM | POA: Diagnosis not present

## 2020-09-24 DIAGNOSIS — R296 Repeated falls: Secondary | ICD-10-CM | POA: Diagnosis not present

## 2020-09-24 DIAGNOSIS — S02401D Maxillary fracture, unspecified, subsequent encounter for fracture with routine healing: Secondary | ICD-10-CM | POA: Diagnosis not present

## 2020-09-24 DIAGNOSIS — M6281 Muscle weakness (generalized): Secondary | ICD-10-CM | POA: Diagnosis not present

## 2020-09-24 DIAGNOSIS — S0230XD Fracture of orbital floor, unspecified side, subsequent encounter for fracture with routine healing: Secondary | ICD-10-CM | POA: Diagnosis not present

## 2020-09-24 DIAGNOSIS — R2681 Unsteadiness on feet: Secondary | ICD-10-CM | POA: Diagnosis not present

## 2020-09-24 NOTE — Assessment & Plan Note (Signed)
Healing nicely, saw Ortho 10/15/2: ROM as tolerated, weight limit to 2 Ibs.

## 2020-09-24 NOTE — Assessment & Plan Note (Signed)
Tremor/Ataxia syndrome, frequent falls, last saw Neurology 03/22/20, MRI showed pathologic atrophy cerebellum

## 2020-09-24 NOTE — Assessment & Plan Note (Signed)
No change, continue Vesicare 

## 2020-09-24 NOTE — Assessment & Plan Note (Signed)
OP, takes Alendronate, Vit D, Ca

## 2020-09-24 NOTE — Assessment & Plan Note (Signed)
Healed

## 2020-09-24 NOTE — Assessment & Plan Note (Signed)
Nerocoognitive dysfunction/dementia, affecting frontal subcortical abilities.  

## 2020-09-24 NOTE — Assessment & Plan Note (Signed)
HTN, takes Ramipril, Atorvastatin, Plavix

## 2020-09-24 NOTE — Assessment & Plan Note (Signed)
T2DM, takes Metformin, Hgb a1c 6.9, TSH 0.96 07/2020, due for eye, foot exam.   

## 2020-09-25 ENCOUNTER — Encounter: Payer: Self-pay | Admitting: Nurse Practitioner

## 2020-09-25 DIAGNOSIS — S0280XD Fracture of other specified skull and facial bones, unspecified side, subsequent encounter for fracture with routine healing: Secondary | ICD-10-CM | POA: Diagnosis not present

## 2020-09-25 DIAGNOSIS — R296 Repeated falls: Secondary | ICD-10-CM | POA: Diagnosis not present

## 2020-09-25 DIAGNOSIS — R2681 Unsteadiness on feet: Secondary | ICD-10-CM | POA: Diagnosis not present

## 2020-09-25 DIAGNOSIS — S02402D Zygomatic fracture, unspecified, subsequent encounter for fracture with routine healing: Secondary | ICD-10-CM | POA: Diagnosis not present

## 2020-09-25 DIAGNOSIS — S02401D Maxillary fracture, unspecified, subsequent encounter for fracture with routine healing: Secondary | ICD-10-CM | POA: Diagnosis not present

## 2020-09-25 DIAGNOSIS — S0230XD Fracture of orbital floor, unspecified side, subsequent encounter for fracture with routine healing: Secondary | ICD-10-CM | POA: Diagnosis not present

## 2020-09-25 DIAGNOSIS — M6281 Muscle weakness (generalized): Secondary | ICD-10-CM | POA: Diagnosis not present

## 2020-09-25 DIAGNOSIS — Z9181 History of falling: Secondary | ICD-10-CM | POA: Diagnosis not present

## 2020-09-26 DIAGNOSIS — Z9181 History of falling: Secondary | ICD-10-CM | POA: Diagnosis not present

## 2020-09-26 DIAGNOSIS — S0280XD Fracture of other specified skull and facial bones, unspecified side, subsequent encounter for fracture with routine healing: Secondary | ICD-10-CM | POA: Diagnosis not present

## 2020-09-26 DIAGNOSIS — M6281 Muscle weakness (generalized): Secondary | ICD-10-CM | POA: Diagnosis not present

## 2020-09-26 DIAGNOSIS — S02401D Maxillary fracture, unspecified, subsequent encounter for fracture with routine healing: Secondary | ICD-10-CM | POA: Diagnosis not present

## 2020-09-26 DIAGNOSIS — S0230XD Fracture of orbital floor, unspecified side, subsequent encounter for fracture with routine healing: Secondary | ICD-10-CM | POA: Diagnosis not present

## 2020-09-26 DIAGNOSIS — R2681 Unsteadiness on feet: Secondary | ICD-10-CM | POA: Diagnosis not present

## 2020-09-26 DIAGNOSIS — R296 Repeated falls: Secondary | ICD-10-CM | POA: Diagnosis not present

## 2020-09-26 DIAGNOSIS — S02402D Zygomatic fracture, unspecified, subsequent encounter for fracture with routine healing: Secondary | ICD-10-CM | POA: Diagnosis not present

## 2020-09-27 DIAGNOSIS — S0280XD Fracture of other specified skull and facial bones, unspecified side, subsequent encounter for fracture with routine healing: Secondary | ICD-10-CM | POA: Diagnosis not present

## 2020-09-27 DIAGNOSIS — R2681 Unsteadiness on feet: Secondary | ICD-10-CM | POA: Diagnosis not present

## 2020-09-27 DIAGNOSIS — M6281 Muscle weakness (generalized): Secondary | ICD-10-CM | POA: Diagnosis not present

## 2020-09-27 DIAGNOSIS — S0230XD Fracture of orbital floor, unspecified side, subsequent encounter for fracture with routine healing: Secondary | ICD-10-CM | POA: Diagnosis not present

## 2020-09-27 DIAGNOSIS — S02401D Maxillary fracture, unspecified, subsequent encounter for fracture with routine healing: Secondary | ICD-10-CM | POA: Diagnosis not present

## 2020-09-27 DIAGNOSIS — R296 Repeated falls: Secondary | ICD-10-CM | POA: Diagnosis not present

## 2020-09-27 DIAGNOSIS — S02402D Zygomatic fracture, unspecified, subsequent encounter for fracture with routine healing: Secondary | ICD-10-CM | POA: Diagnosis not present

## 2020-09-27 DIAGNOSIS — Z9181 History of falling: Secondary | ICD-10-CM | POA: Diagnosis not present

## 2020-09-30 DIAGNOSIS — S02402D Zygomatic fracture, unspecified, subsequent encounter for fracture with routine healing: Secondary | ICD-10-CM | POA: Diagnosis not present

## 2020-09-30 DIAGNOSIS — R2681 Unsteadiness on feet: Secondary | ICD-10-CM | POA: Diagnosis not present

## 2020-09-30 DIAGNOSIS — S0230XD Fracture of orbital floor, unspecified side, subsequent encounter for fracture with routine healing: Secondary | ICD-10-CM | POA: Diagnosis not present

## 2020-09-30 DIAGNOSIS — S02401D Maxillary fracture, unspecified, subsequent encounter for fracture with routine healing: Secondary | ICD-10-CM | POA: Diagnosis not present

## 2020-09-30 DIAGNOSIS — M6281 Muscle weakness (generalized): Secondary | ICD-10-CM | POA: Diagnosis not present

## 2020-09-30 DIAGNOSIS — S0280XD Fracture of other specified skull and facial bones, unspecified side, subsequent encounter for fracture with routine healing: Secondary | ICD-10-CM | POA: Diagnosis not present

## 2020-09-30 DIAGNOSIS — Z9181 History of falling: Secondary | ICD-10-CM | POA: Diagnosis not present

## 2020-09-30 DIAGNOSIS — R296 Repeated falls: Secondary | ICD-10-CM | POA: Diagnosis not present

## 2020-10-01 DIAGNOSIS — S02402D Zygomatic fracture, unspecified, subsequent encounter for fracture with routine healing: Secondary | ICD-10-CM | POA: Diagnosis not present

## 2020-10-01 DIAGNOSIS — M6281 Muscle weakness (generalized): Secondary | ICD-10-CM | POA: Diagnosis not present

## 2020-10-01 DIAGNOSIS — R296 Repeated falls: Secondary | ICD-10-CM | POA: Diagnosis not present

## 2020-10-01 DIAGNOSIS — Z9181 History of falling: Secondary | ICD-10-CM | POA: Diagnosis not present

## 2020-10-01 DIAGNOSIS — S0280XD Fracture of other specified skull and facial bones, unspecified side, subsequent encounter for fracture with routine healing: Secondary | ICD-10-CM | POA: Diagnosis not present

## 2020-10-01 DIAGNOSIS — S0230XD Fracture of orbital floor, unspecified side, subsequent encounter for fracture with routine healing: Secondary | ICD-10-CM | POA: Diagnosis not present

## 2020-10-01 DIAGNOSIS — R2681 Unsteadiness on feet: Secondary | ICD-10-CM | POA: Diagnosis not present

## 2020-10-01 DIAGNOSIS — S02401D Maxillary fracture, unspecified, subsequent encounter for fracture with routine healing: Secondary | ICD-10-CM | POA: Diagnosis not present

## 2020-10-02 DIAGNOSIS — R296 Repeated falls: Secondary | ICD-10-CM | POA: Diagnosis not present

## 2020-10-02 DIAGNOSIS — S0230XD Fracture of orbital floor, unspecified side, subsequent encounter for fracture with routine healing: Secondary | ICD-10-CM | POA: Diagnosis not present

## 2020-10-02 DIAGNOSIS — R2681 Unsteadiness on feet: Secondary | ICD-10-CM | POA: Diagnosis not present

## 2020-10-02 DIAGNOSIS — Z9181 History of falling: Secondary | ICD-10-CM | POA: Diagnosis not present

## 2020-10-02 DIAGNOSIS — S02401D Maxillary fracture, unspecified, subsequent encounter for fracture with routine healing: Secondary | ICD-10-CM | POA: Diagnosis not present

## 2020-10-02 DIAGNOSIS — S0280XD Fracture of other specified skull and facial bones, unspecified side, subsequent encounter for fracture with routine healing: Secondary | ICD-10-CM | POA: Diagnosis not present

## 2020-10-02 DIAGNOSIS — M6281 Muscle weakness (generalized): Secondary | ICD-10-CM | POA: Diagnosis not present

## 2020-10-02 DIAGNOSIS — S02402D Zygomatic fracture, unspecified, subsequent encounter for fracture with routine healing: Secondary | ICD-10-CM | POA: Diagnosis not present

## 2020-10-04 DIAGNOSIS — R296 Repeated falls: Secondary | ICD-10-CM | POA: Diagnosis not present

## 2020-10-04 DIAGNOSIS — R2681 Unsteadiness on feet: Secondary | ICD-10-CM | POA: Diagnosis not present

## 2020-10-04 DIAGNOSIS — S02402D Zygomatic fracture, unspecified, subsequent encounter for fracture with routine healing: Secondary | ICD-10-CM | POA: Diagnosis not present

## 2020-10-04 DIAGNOSIS — Z9181 History of falling: Secondary | ICD-10-CM | POA: Diagnosis not present

## 2020-10-04 DIAGNOSIS — S0230XD Fracture of orbital floor, unspecified side, subsequent encounter for fracture with routine healing: Secondary | ICD-10-CM | POA: Diagnosis not present

## 2020-10-04 DIAGNOSIS — S0280XD Fracture of other specified skull and facial bones, unspecified side, subsequent encounter for fracture with routine healing: Secondary | ICD-10-CM | POA: Diagnosis not present

## 2020-10-04 DIAGNOSIS — S02401D Maxillary fracture, unspecified, subsequent encounter for fracture with routine healing: Secondary | ICD-10-CM | POA: Diagnosis not present

## 2020-10-04 DIAGNOSIS — M6281 Muscle weakness (generalized): Secondary | ICD-10-CM | POA: Diagnosis not present

## 2020-10-07 DIAGNOSIS — S0280XD Fracture of other specified skull and facial bones, unspecified side, subsequent encounter for fracture with routine healing: Secondary | ICD-10-CM | POA: Diagnosis not present

## 2020-10-07 DIAGNOSIS — S02401D Maxillary fracture, unspecified, subsequent encounter for fracture with routine healing: Secondary | ICD-10-CM | POA: Diagnosis not present

## 2020-10-07 DIAGNOSIS — S0230XD Fracture of orbital floor, unspecified side, subsequent encounter for fracture with routine healing: Secondary | ICD-10-CM | POA: Diagnosis not present

## 2020-10-07 DIAGNOSIS — R296 Repeated falls: Secondary | ICD-10-CM | POA: Diagnosis not present

## 2020-10-07 DIAGNOSIS — M6281 Muscle weakness (generalized): Secondary | ICD-10-CM | POA: Diagnosis not present

## 2020-10-07 DIAGNOSIS — S02402D Zygomatic fracture, unspecified, subsequent encounter for fracture with routine healing: Secondary | ICD-10-CM | POA: Diagnosis not present

## 2020-10-07 DIAGNOSIS — Z9181 History of falling: Secondary | ICD-10-CM | POA: Diagnosis not present

## 2020-10-07 DIAGNOSIS — R2681 Unsteadiness on feet: Secondary | ICD-10-CM | POA: Diagnosis not present

## 2020-10-08 DIAGNOSIS — S0280XD Fracture of other specified skull and facial bones, unspecified side, subsequent encounter for fracture with routine healing: Secondary | ICD-10-CM | POA: Diagnosis not present

## 2020-10-08 DIAGNOSIS — Z9181 History of falling: Secondary | ICD-10-CM | POA: Diagnosis not present

## 2020-10-08 DIAGNOSIS — S02402D Zygomatic fracture, unspecified, subsequent encounter for fracture with routine healing: Secondary | ICD-10-CM | POA: Diagnosis not present

## 2020-10-08 DIAGNOSIS — R2681 Unsteadiness on feet: Secondary | ICD-10-CM | POA: Diagnosis not present

## 2020-10-08 DIAGNOSIS — S0230XD Fracture of orbital floor, unspecified side, subsequent encounter for fracture with routine healing: Secondary | ICD-10-CM | POA: Diagnosis not present

## 2020-10-08 DIAGNOSIS — M6281 Muscle weakness (generalized): Secondary | ICD-10-CM | POA: Diagnosis not present

## 2020-10-08 DIAGNOSIS — R296 Repeated falls: Secondary | ICD-10-CM | POA: Diagnosis not present

## 2020-10-08 DIAGNOSIS — S02401D Maxillary fracture, unspecified, subsequent encounter for fracture with routine healing: Secondary | ICD-10-CM | POA: Diagnosis not present

## 2020-10-09 DIAGNOSIS — Z9181 History of falling: Secondary | ICD-10-CM | POA: Diagnosis not present

## 2020-10-09 DIAGNOSIS — S02402D Zygomatic fracture, unspecified, subsequent encounter for fracture with routine healing: Secondary | ICD-10-CM | POA: Diagnosis not present

## 2020-10-09 DIAGNOSIS — R1311 Dysphagia, oral phase: Secondary | ICD-10-CM | POA: Diagnosis not present

## 2020-10-09 DIAGNOSIS — R2681 Unsteadiness on feet: Secondary | ICD-10-CM | POA: Diagnosis not present

## 2020-10-09 DIAGNOSIS — S02401D Maxillary fracture, unspecified, subsequent encounter for fracture with routine healing: Secondary | ICD-10-CM | POA: Diagnosis not present

## 2020-10-09 DIAGNOSIS — R296 Repeated falls: Secondary | ICD-10-CM | POA: Diagnosis not present

## 2020-10-09 DIAGNOSIS — M6281 Muscle weakness (generalized): Secondary | ICD-10-CM | POA: Diagnosis not present

## 2020-10-09 DIAGNOSIS — S0280XD Fracture of other specified skull and facial bones, unspecified side, subsequent encounter for fracture with routine healing: Secondary | ICD-10-CM | POA: Diagnosis not present

## 2020-10-09 DIAGNOSIS — S0230XD Fracture of orbital floor, unspecified side, subsequent encounter for fracture with routine healing: Secondary | ICD-10-CM | POA: Diagnosis not present

## 2020-10-10 ENCOUNTER — Encounter: Payer: Self-pay | Admitting: Internal Medicine

## 2020-10-10 ENCOUNTER — Non-Acute Institutional Stay (SKILLED_NURSING_FACILITY): Payer: Medicare Other | Admitting: Internal Medicine

## 2020-10-10 DIAGNOSIS — E1149 Type 2 diabetes mellitus with other diabetic neurological complication: Secondary | ICD-10-CM

## 2020-10-10 DIAGNOSIS — R270 Ataxia, unspecified: Secondary | ICD-10-CM

## 2020-10-10 DIAGNOSIS — E785 Hyperlipidemia, unspecified: Secondary | ICD-10-CM

## 2020-10-10 DIAGNOSIS — S42294D Other nondisplaced fracture of upper end of right humerus, subsequent encounter for fracture with routine healing: Secondary | ICD-10-CM

## 2020-10-10 DIAGNOSIS — M810 Age-related osteoporosis without current pathological fracture: Secondary | ICD-10-CM

## 2020-10-10 DIAGNOSIS — S0240ES Zygomatic fracture, right side, sequela: Secondary | ICD-10-CM

## 2020-10-10 DIAGNOSIS — I10 Essential (primary) hypertension: Secondary | ICD-10-CM | POA: Diagnosis not present

## 2020-10-10 NOTE — Progress Notes (Signed)
Location:   [facility]   Place of Service:   SNF Rm 28  Provider: Einar CrowGupta, Penina Reisner, MD  PCP: Mahlon GammonGupta, Isiaih Hollenbach L, MD Patient Care Team: Mahlon GammonGupta, Alphonsine Minium L, MD as PCP - General (Internal Medicine) Drema DallasJaffe, Adam R, DO as Consulting Physician (Neurology)  Extended Emergency Contact Information Primary Emergency Contact: [name],[name] Address: [address]          [place] [id number] [place] Home Phone: [phone] Relation: Sister Secondary Emergency Contact: [facility],Rick Address: [address] [place]          [place], [place] [id number] [place] Mobile Phone: [phone] Relation: Brother  Code Status: DNR Goals of care:  Advanced Directive information Advanced Directives 10/10/2020  Does Patient Have a Medical Advance Directive? Yes  Type of Estate agentAdvance Directive Healthcare Power of ComerAttorney;Living will;Out of facility DNR (pink MOST or yellow form)  Does patient want to make changes to medical advance directive? No - Patient declined  Copy of Healthcare Power of Attorney in Chart? Yes - validated most recent copy scanned in chart (See row information)  Would patient like information on creating a medical advance directive? -  Pre-existing out of facility DNR order (yellow form or pink MOST form) Pink MOST form placed in chart (order not valid for inpatient use)     Allergies  Allergen Reactions  . Amoxicillin-Pot Clavulanate Hives  . Meloxicam     Unknown reaction  . Scallops [Shellfish Allergy] Other (See Comments)    "I don't feel so good after an hour"  . Toviaz [Fesoterodine Fumarate Er]     Unknown reaction    Chief Complaint  Patient presents with  . Discharge Note    Discharge to [place]    HPI:  79 y.o. 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  Patient has been  living in [place] and had been doing well when she had a unwitnessed fall.  She sustained closed displaced fracture of proximal end of right humerus needing Soft Cast Was admitted now in SNF as she is unable to do her ADLs  Did well with therapy. Pain Controlled with Tylenol. Has not needed Oxycodone No Dizziness Independent in her ADLS and transfers now No Falls Looking forward to moving back to [place]   Past Medical History:  Diagnosis Date  . Diabetes mellitus   . Dyslipidemia   . Hypertension   . Osteoporosis   . Urinary incontinence     History reviewed. No pertinent surgical history.    reports that she has never smoked. She has never used smokeless tobacco. She reports that she does not drink alcohol and does not use drugs. Social History   Socioeconomic History  . Marital status: Single    Spouse name: Not on file  . Number of children: 0  . Years of education: Not on file  . Highest education level: Master's degree (e.g., MA, MS, MEng, MEd, MSW, MBA)  Occupational History  . Occupation: retired  Tobacco Use  . Smoking status: Never Smoker  . Smokeless tobacco: Never Used  Vaping Use  . Vaping Use: Never used  Substance and Sexual Activity  . Alcohol use: No  . Drug use: No  . Sexual activity: Not on file  Other Topics Concern  . Not on file  Social History Narrative  Social Review      Patient lives at home alone   Pt lives in a one or two story home?one story home   Private home   What is patient highest level of education?Masters Degree   Is patient RIGHT or LEFT handed? RIGHT HANDED   Does patient drink coffee, tea, soda? YES    How much coffee, tea, soda does patient drink? SODA only at lunch, sometimes 1-2 times a week   Does patient exercise regularly? NO   Social Determinants of Health   Financial Resource Strain:   . Difficulty of Paying Living Expenses: Not on file  Food Insecurity:   . Worried About Programme researcher, broadcasting/film/video in the Last Year: Not on  file  . Ran Out of Food in the Last Year: Not on file  Transportation Needs:   . Lack of Transportation (Medical): Not on file  . Lack of Transportation (Non-Medical): Not on file  Physical Activity:   . Days of Exercise per Week: Not on file  . Minutes of Exercise per Session: Not on file  Stress:   . Feeling of Stress : Not on file  Social Connections:   . Frequency of Communication with Friends and Family: Not on file  . Frequency of Social Gatherings with Friends and Family: Not on file  . Attends Religious Services: Not on file  . Active Member of Clubs or Organizations: Not on file  . Attends Banker Meetings: Not on file  . Marital Status: Not on file  Intimate Partner Violence:   . Fear of Current or Ex-Partner: Not on file  . Emotionally Abused: Not on file  . Physically Abused: Not on file  . Sexually Abused: Not on file   Functional Status Survey:    Allergies  Allergen Reactions  . Amoxicillin-Pot Clavulanate Hives  . Meloxicam     Unknown reaction  . Scallops [Shellfish Allergy] Other (See Comments)    "I don't feel so good after an hour"  . Toviaz [Fesoterodine Fumarate Er]     Unknown reaction    Pertinent  Health Maintenance Due  Topic Date Due  . FOOT EXAM  Never done  . OPHTHALMOLOGY EXAM  Never done  . PNA vac Low Risk Adult (1 of 2 - PCV13) Never done  . HEMOGLOBIN A1C  01/08/2021  . INFLUENZA VACCINE  Completed  . DEXA SCAN  Completed    Medications: Allergies as of 10/10/2020      Reactions   Amoxicillin-pot Clavulanate Hives   Meloxicam    Unknown reaction   Scallops [shellfish Allergy] Other (See Comments)   "I don't feel so good after an hour"   Toviaz [fesoterodine Fumarate Er]    Unknown reaction      Medication List       Accurate as of October 10, 2020 11:47 AM. If you have any questions, ask your nurse or doctor.        alendronate 70 MG tablet Commonly known as: FOSAMAX Take 70 mg by mouth once a week.  Sundays   atorvastatin 40 MG tablet Commonly known as: LIPITOR Take 1 tablet (40 mg total) by mouth daily.   CALTRATE 600+D PO Take 1 tablet by mouth 2 (two) times daily.   CENTRUM SILVER PO Take 1 tablet by mouth daily.   clopidogrel 75 MG tablet Commonly known as: PLAVIX Take 75 mg by mouth daily.   metFORMIN 500 MG tablet Commonly known as: GLUCOPHAGE Take 500 mg by  mouth 2 (two) times daily with a meal.   oxyCODONE 5 MG immediate release tablet Commonly known as: Roxicodone Take 1 tablet (5 mg total) by mouth every 4 (four) hours as needed for severe pain.   ramipril 2.5 MG capsule Commonly known as: ALTACE Take 2.5 mg by mouth daily.   sennosides-docusate sodium 8.6-50 MG tablet Commonly known as: SENOKOT-S Take 1 tablet by mouth in the morning and at bedtime.   solifenacin 10 MG tablet Commonly known as: VESICARE Take 10 mg by mouth daily.   VITAMIN D PO Take 1 tablet by mouth daily. 1,000 units   zinc oxide 20 % ointment Apply 1 application topically as needed for irritation. To buttocks after every incontinent episode and as needed for redness. May keep at bedside.       Review of Systems  Vitals:   10/10/20 1138  BP: (!) 95/57  Pulse: 71  Resp: 20  Temp: (!) 97.4 F (36.3 C)  SpO2: 96%  Weight: 129 lb 3.2 oz (58.6 kg)  Height: 5\' 3"  (1.6 m)   Body mass index is 22.89 kg/m. Physical Exam  Constitutional: Oriented to person, place, and time. Well-developed and well-nourished.  HENT:  Head: Normocephalic.  Mouth/Throat: Oropharynx is clear and moist.  Eyes: Pupils are equal, round, and reactive to light.  Neck: Neck supple.  Cardiovascular: Normal rate and normal heart sounds.  No murmur heard. Pulmonary/Chest: Effort normal and breath sounds normal. No respiratory distress. No wheezes. She has no rales.  Abdominal: Soft. Bowel sounds are normal. No distension. There is no tenderness. There is no rebound.  Musculoskeletal: No edema.    Lymphadenopathy: none Neurological: Alert and oriented to person, place, and time.  Skin: Skin is warm and dry.  Psychiatric: Normal mood and affect. Behavior is normal. Thought content normal.    Labs reviewed: Basic Metabolic Panel: Recent Labs    07/10/20 1105 07/11/20 0000  NA 136 138  K 4.2 4.2  CL 100 104  CO2 22 23*  GLUCOSE 187*  --   BUN 21 21  CREATININE 0.94 1.0  CALCIUM 9.3 9.1   Liver Function Tests: Recent Labs    07/11/20 0000  AST 14  ALT 10  ALKPHOS 53  ALBUMIN 3.5   No results for input(s): LIPASE, AMYLASE in the last 8760 hours. No results for input(s): AMMONIA in the last 8760 hours. CBC: Recent Labs    07/10/20 1105 07/11/20 0000  WBC 10.2 6.4  NEUTROABS 8.1* 3,725  HGB 12.5 12.1  HCT 37.4 36  MCV 93.3  --   PLT 251 257   Cardiac Enzymes: No results for input(s): CKTOTAL, CKMB, CKMBINDEX, TROPONINI in the last 8760 hours. BNP: Invalid input(s): POCBNP CBG: No results for input(s): GLUCAP in the last 8760 hours.  Procedures and Imaging Studies During Stay: No results found.  Assessment/Plan:   Type 2 diabetes mellitus with other neurologic complication, without long-term current use of insulin (HCC) On Metformin A1C was 6.9  Ataxia/Neurocognitive disorder Follows with Neurology Doing well with therapy Will continue therapy in [place]  Hypertension, unspecified type With low BP and her risk of falls  Will Discontinue Ramipril  closed nondisplaced fracture of proximal end of right humerus with routine healing,  No Pain Discontinue Oxycodone Continue Therapy in [place] Closed fracture of right zygomatic arch, sequela (HCC) Doing well Age related osteoporosis, unspecified pathological fracture presence On Fosamax Will need follow up of her DEXA Hyperlipidemia On Lipitor Will need follow up of her  Lipids Lat LDL in 10/20 was 86  H/o TIA On Plavix and Stain Urinary incontinence Continue Vesicare  Future labs/tests needed:

## 2020-10-11 DIAGNOSIS — R296 Repeated falls: Secondary | ICD-10-CM | POA: Diagnosis not present

## 2020-10-11 DIAGNOSIS — S0280XD Fracture of other specified skull and facial bones, unspecified side, subsequent encounter for fracture with routine healing: Secondary | ICD-10-CM | POA: Diagnosis not present

## 2020-10-11 DIAGNOSIS — R1311 Dysphagia, oral phase: Secondary | ICD-10-CM | POA: Diagnosis not present

## 2020-10-11 DIAGNOSIS — R2681 Unsteadiness on feet: Secondary | ICD-10-CM | POA: Diagnosis not present

## 2020-10-11 DIAGNOSIS — S02401D Maxillary fracture, unspecified, subsequent encounter for fracture with routine healing: Secondary | ICD-10-CM | POA: Diagnosis not present

## 2020-10-11 DIAGNOSIS — Z9181 History of falling: Secondary | ICD-10-CM | POA: Diagnosis not present

## 2020-10-11 DIAGNOSIS — S02402D Zygomatic fracture, unspecified, subsequent encounter for fracture with routine healing: Secondary | ICD-10-CM | POA: Diagnosis not present

## 2020-10-11 DIAGNOSIS — S0230XD Fracture of orbital floor, unspecified side, subsequent encounter for fracture with routine healing: Secondary | ICD-10-CM | POA: Diagnosis not present

## 2020-10-11 DIAGNOSIS — M6281 Muscle weakness (generalized): Secondary | ICD-10-CM | POA: Diagnosis not present

## 2020-10-14 DIAGNOSIS — S02401D Maxillary fracture, unspecified, subsequent encounter for fracture with routine healing: Secondary | ICD-10-CM | POA: Diagnosis not present

## 2020-10-14 DIAGNOSIS — M6281 Muscle weakness (generalized): Secondary | ICD-10-CM | POA: Diagnosis not present

## 2020-10-14 DIAGNOSIS — R2681 Unsteadiness on feet: Secondary | ICD-10-CM | POA: Diagnosis not present

## 2020-10-14 DIAGNOSIS — S02402D Zygomatic fracture, unspecified, subsequent encounter for fracture with routine healing: Secondary | ICD-10-CM | POA: Diagnosis not present

## 2020-10-14 DIAGNOSIS — R296 Repeated falls: Secondary | ICD-10-CM | POA: Diagnosis not present

## 2020-10-14 DIAGNOSIS — S0230XD Fracture of orbital floor, unspecified side, subsequent encounter for fracture with routine healing: Secondary | ICD-10-CM | POA: Diagnosis not present

## 2020-10-14 DIAGNOSIS — R1311 Dysphagia, oral phase: Secondary | ICD-10-CM | POA: Diagnosis not present

## 2020-10-14 DIAGNOSIS — Z9181 History of falling: Secondary | ICD-10-CM | POA: Diagnosis not present

## 2020-10-14 DIAGNOSIS — S0280XD Fracture of other specified skull and facial bones, unspecified side, subsequent encounter for fracture with routine healing: Secondary | ICD-10-CM | POA: Diagnosis not present

## 2020-10-15 DIAGNOSIS — Z9181 History of falling: Secondary | ICD-10-CM | POA: Diagnosis not present

## 2020-10-15 DIAGNOSIS — R2681 Unsteadiness on feet: Secondary | ICD-10-CM | POA: Diagnosis not present

## 2020-10-15 DIAGNOSIS — M6281 Muscle weakness (generalized): Secondary | ICD-10-CM | POA: Diagnosis not present

## 2020-10-17 DIAGNOSIS — Z9181 History of falling: Secondary | ICD-10-CM | POA: Diagnosis not present

## 2020-10-17 DIAGNOSIS — R2681 Unsteadiness on feet: Secondary | ICD-10-CM | POA: Diagnosis not present

## 2020-10-17 DIAGNOSIS — M6281 Muscle weakness (generalized): Secondary | ICD-10-CM | POA: Diagnosis not present

## 2020-10-18 DIAGNOSIS — M6281 Muscle weakness (generalized): Secondary | ICD-10-CM | POA: Diagnosis not present

## 2020-10-18 DIAGNOSIS — R2681 Unsteadiness on feet: Secondary | ICD-10-CM | POA: Diagnosis not present

## 2020-10-18 DIAGNOSIS — Z9181 History of falling: Secondary | ICD-10-CM | POA: Diagnosis not present

## 2020-10-21 DIAGNOSIS — R2681 Unsteadiness on feet: Secondary | ICD-10-CM | POA: Diagnosis not present

## 2020-10-21 DIAGNOSIS — Z9181 History of falling: Secondary | ICD-10-CM | POA: Diagnosis not present

## 2020-10-21 DIAGNOSIS — M6281 Muscle weakness (generalized): Secondary | ICD-10-CM | POA: Diagnosis not present

## 2020-10-22 DIAGNOSIS — R2681 Unsteadiness on feet: Secondary | ICD-10-CM | POA: Diagnosis not present

## 2020-10-22 DIAGNOSIS — M6281 Muscle weakness (generalized): Secondary | ICD-10-CM | POA: Diagnosis not present

## 2020-10-22 DIAGNOSIS — Z9181 History of falling: Secondary | ICD-10-CM | POA: Diagnosis not present

## 2020-10-23 DIAGNOSIS — M6281 Muscle weakness (generalized): Secondary | ICD-10-CM | POA: Diagnosis not present

## 2020-10-23 DIAGNOSIS — R2681 Unsteadiness on feet: Secondary | ICD-10-CM | POA: Diagnosis not present

## 2020-10-23 DIAGNOSIS — Z9181 History of falling: Secondary | ICD-10-CM | POA: Diagnosis not present

## 2020-10-24 DIAGNOSIS — M6281 Muscle weakness (generalized): Secondary | ICD-10-CM | POA: Diagnosis not present

## 2020-10-24 DIAGNOSIS — R2681 Unsteadiness on feet: Secondary | ICD-10-CM | POA: Diagnosis not present

## 2020-10-24 DIAGNOSIS — Z9181 History of falling: Secondary | ICD-10-CM | POA: Diagnosis not present

## 2020-10-25 DIAGNOSIS — M6281 Muscle weakness (generalized): Secondary | ICD-10-CM | POA: Diagnosis not present

## 2020-10-25 DIAGNOSIS — Z9181 History of falling: Secondary | ICD-10-CM | POA: Diagnosis not present

## 2020-10-25 DIAGNOSIS — R2681 Unsteadiness on feet: Secondary | ICD-10-CM | POA: Diagnosis not present

## 2020-10-28 DIAGNOSIS — Z9181 History of falling: Secondary | ICD-10-CM | POA: Diagnosis not present

## 2020-10-28 DIAGNOSIS — M6281 Muscle weakness (generalized): Secondary | ICD-10-CM | POA: Diagnosis not present

## 2020-10-28 DIAGNOSIS — R2681 Unsteadiness on feet: Secondary | ICD-10-CM | POA: Diagnosis not present

## 2020-10-29 DIAGNOSIS — Z9181 History of falling: Secondary | ICD-10-CM | POA: Diagnosis not present

## 2020-10-29 DIAGNOSIS — M6281 Muscle weakness (generalized): Secondary | ICD-10-CM | POA: Diagnosis not present

## 2020-10-29 DIAGNOSIS — R2681 Unsteadiness on feet: Secondary | ICD-10-CM | POA: Diagnosis not present

## 2020-10-30 DIAGNOSIS — Z9181 History of falling: Secondary | ICD-10-CM | POA: Diagnosis not present

## 2020-10-30 DIAGNOSIS — M6281 Muscle weakness (generalized): Secondary | ICD-10-CM | POA: Diagnosis not present

## 2020-10-30 DIAGNOSIS — R2681 Unsteadiness on feet: Secondary | ICD-10-CM | POA: Diagnosis not present

## 2020-10-31 DIAGNOSIS — Z9181 History of falling: Secondary | ICD-10-CM | POA: Diagnosis not present

## 2020-10-31 DIAGNOSIS — R2681 Unsteadiness on feet: Secondary | ICD-10-CM | POA: Diagnosis not present

## 2020-10-31 DIAGNOSIS — M6281 Muscle weakness (generalized): Secondary | ICD-10-CM | POA: Diagnosis not present

## 2020-11-01 DIAGNOSIS — M6281 Muscle weakness (generalized): Secondary | ICD-10-CM | POA: Diagnosis not present

## 2020-11-01 DIAGNOSIS — R2681 Unsteadiness on feet: Secondary | ICD-10-CM | POA: Diagnosis not present

## 2020-11-01 DIAGNOSIS — Z9181 History of falling: Secondary | ICD-10-CM | POA: Diagnosis not present

## 2020-11-04 DIAGNOSIS — M6281 Muscle weakness (generalized): Secondary | ICD-10-CM | POA: Diagnosis not present

## 2020-11-04 DIAGNOSIS — Z9181 History of falling: Secondary | ICD-10-CM | POA: Diagnosis not present

## 2020-11-04 DIAGNOSIS — R2681 Unsteadiness on feet: Secondary | ICD-10-CM | POA: Diagnosis not present

## 2020-11-05 DIAGNOSIS — R2681 Unsteadiness on feet: Secondary | ICD-10-CM | POA: Diagnosis not present

## 2020-11-05 DIAGNOSIS — Z9181 History of falling: Secondary | ICD-10-CM | POA: Diagnosis not present

## 2020-11-05 DIAGNOSIS — M6281 Muscle weakness (generalized): Secondary | ICD-10-CM | POA: Diagnosis not present

## 2020-11-06 DIAGNOSIS — R2681 Unsteadiness on feet: Secondary | ICD-10-CM | POA: Diagnosis not present

## 2020-11-06 DIAGNOSIS — Z9181 History of falling: Secondary | ICD-10-CM | POA: Diagnosis not present

## 2020-11-06 DIAGNOSIS — M6281 Muscle weakness (generalized): Secondary | ICD-10-CM | POA: Diagnosis not present

## 2020-11-07 DIAGNOSIS — M6281 Muscle weakness (generalized): Secondary | ICD-10-CM | POA: Diagnosis not present

## 2020-11-07 DIAGNOSIS — R2681 Unsteadiness on feet: Secondary | ICD-10-CM | POA: Diagnosis not present

## 2020-11-07 DIAGNOSIS — Z9181 History of falling: Secondary | ICD-10-CM | POA: Diagnosis not present

## 2020-11-08 DIAGNOSIS — R2681 Unsteadiness on feet: Secondary | ICD-10-CM | POA: Diagnosis not present

## 2020-11-08 DIAGNOSIS — M6281 Muscle weakness (generalized): Secondary | ICD-10-CM | POA: Diagnosis not present

## 2020-11-08 DIAGNOSIS — Z9181 History of falling: Secondary | ICD-10-CM | POA: Diagnosis not present

## 2020-11-12 DIAGNOSIS — S42294D Other nondisplaced fracture of upper end of right humerus, subsequent encounter for fracture with routine healing: Secondary | ICD-10-CM | POA: Diagnosis not present

## 2020-12-26 ENCOUNTER — Encounter: Payer: Self-pay | Admitting: Internal Medicine

## 2020-12-26 ENCOUNTER — Non-Acute Institutional Stay: Payer: Medicare Other | Admitting: Internal Medicine

## 2020-12-26 DIAGNOSIS — M25511 Pain in right shoulder: Secondary | ICD-10-CM | POA: Diagnosis not present

## 2020-12-26 DIAGNOSIS — I10 Essential (primary) hypertension: Secondary | ICD-10-CM

## 2020-12-26 DIAGNOSIS — R270 Ataxia, unspecified: Secondary | ICD-10-CM

## 2020-12-26 DIAGNOSIS — E1149 Type 2 diabetes mellitus with other diabetic neurological complication: Secondary | ICD-10-CM | POA: Diagnosis not present

## 2020-12-26 DIAGNOSIS — E785 Hyperlipidemia, unspecified: Secondary | ICD-10-CM

## 2020-12-26 DIAGNOSIS — M810 Age-related osteoporosis without current pathological fracture: Secondary | ICD-10-CM

## 2020-12-26 NOTE — Progress Notes (Signed)
Location:    [facility] Room Number: 34 Place of Service:  ALF [phone]) Provider:  Einar Crow MD  Mahlon Gammon, MD  Patient Care Team: Mahlon Gammon, MD as PCP - General (Internal Medicine) [name], DO as Consulting Physician (Neurology)  Extended Emergency Contact Information Primary Emergency Contact: [name],[name] Address: [address]          [place] [address] [name] Home Phone: [phone] Relation: Sister Secondary Emergency Contact: [facility] Address: [address] [place], [place] [address] [name] Mobile Phone: [phone] Relation: Brother  Code Status:  Managed Care Goals of care: Advanced Directive information Advanced Directives 10/10/2020  Does Patient Have a Medical Advance Directive? Yes  Type of Estate agent of [place];Living will;Out of facility DNR (pink MOST or yellow form)  Does patient want to make changes to medical advance directive? No - Patient declined  Copy of Healthcare Power of Attorney in Chart? Yes - validated most recent copy scanned in chart (See row information)  Would patient like information on creating a medical advance directive? -  Pre-existing out of facility DNR order (yellow form or pink MOST form) Pink MOST form placed in chart (order not valid for inpatient use)     Chief Complaint  Patient presents with  . Acute Visit    Pain in her arm     HPI:  Pt is a 80 y.o. 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 Also History of closed displaced fracture of proximal end of right humerus needing Soft Cast  Has recently started working with therapy Now c/o Pain in her Right arm. It seems more in her right shoulder specially when she tries to  lift her Right arm No Recent Fall or injuries Pain is worse when she is working with therapy. Does not bother her at night No Other issues    Past Medical History:  Diagnosis Date  . Diabetes mellitus   . Dyslipidemia   . Hypertension   . Osteoporosis   . Urinary incontinence    History reviewed. No pertinent surgical history.  Allergies  Allergen Reactions  . Amoxicillin-Pot Clavulanate Hives  . Meloxicam     Unknown reaction  . Scallops [Shellfish Allergy] Other (See Comments)    "I don't feel so good after an hour"  . Toviaz [Fesoterodine Fumarate Er]     Unknown reaction    Allergies as of 12/26/2020      Reactions   Amoxicillin-pot Clavulanate Hives   Meloxicam    Unknown reaction   Scallops [shellfish Allergy] Other (See Comments)   "I don't feel so good after an hour"   Toviaz [fesoterodine Fumarate Er]    Unknown reaction      Medication List       Accurate as of December 26, 2020  9:43 AM. If you have any questions, ask your nurse or doctor.        STOP taking these medications   solifenacin 10 MG tablet Commonly known as: VESICARE Stopped by: Mahlon Gammon, MD     TAKE these medications   acetaminophen 325 MG tablet Commonly known as: TYLENOL Take 650 mg by mouth 2 (two) times daily.   alendronate 70 MG  tablet Commonly known as: FOSAMAX Take 70 mg by mouth once a week. Sundays   atorvastatin 40 MG tablet Commonly known as: LIPITOR Take 1 tablet (40 mg total) by mouth daily.   CALTRATE 600+D PO Take 1 tablet by mouth 2 (two) times daily.   CENTRUM SILVER PO Take 1 tablet by mouth daily.   clopidogrel 75 MG tablet Commonly known as: PLAVIX Take 75 mg by mouth daily.   metFORMIN 500 MG tablet Commonly known as: GLUCOPHAGE Take 500 mg by mouth 2 (two) times daily with a meal.   oxybutynin 5 MG 24 hr tablet Commonly known as: DITROPAN-XL Take 5 mg by mouth at bedtime.   sennosides-docusate sodium 8.6-50 MG tablet Commonly known  as: SENOKOT-S Take 1 tablet by mouth in the morning and at bedtime.   VITAMIN D PO Take 1 tablet by mouth daily. 1,000 units   zinc oxide 20 % ointment Apply 1 application topically as needed for irritation. To buttocks after every incontinent episode and as needed for redness. May keep at bedside.       Review of Systems  Review of Systems  Constitutional: Negative for activity change, appetite change, chills, diaphoresis, fatigue and fever.  HENT: Negative for mouth sores, postnasal drip, rhinorrhea, sinus pain and sore throat.   Respiratory: Negative for apnea, cough, chest tightness, shortness of breath and wheezing.   Cardiovascular: Negative for chest pain, palpitations and leg swelling.  Gastrointestinal: Negative for abdominal distention, abdominal pain, constipation, diarrhea, nausea and vomiting.  Genitourinary: Negative for dysuria and frequency.  Musculoskeletal: Negative for arthralgias, joint swelling and myalgias.  Skin: Negative for rash.  Neurological: Negative for dizziness, syncope, weakness, light-headedness and numbness.  Psychiatric/Behavioral: Negative for behavioral problems, confusion and sleep disturbance.     Immunization History  Administered Date(s) Administered  . Influenza Inj Mdck Quad Pf 08/10/2019  . Influenza-Unspecified 08/13/2020  . Moderna Sars-Covid-2 Vaccination 12/11/2019, 01/08/2020  . Zoster 07/01/2020   Pertinent  Health Maintenance Due  Topic Date Due  . FOOT EXAM  Never done  . OPHTHALMOLOGY EXAM  Never done  . URINE MICROALBUMIN  Never done  . PNA vac Low Risk Adult (1 of 2 - PCV13) Never done  . HEMOGLOBIN A1C  01/08/2021  . INFLUENZA VACCINE  Completed  . DEXA SCAN  Completed   Fall Risk  03/05/2020 09/06/2019  Falls in the past year? 1 1  Number falls in past yr: 0 0  Injury with Fall? 1 1   Functional Status Survey:    Vitals:   12/26/20 0939  BP: 122/72  Pulse: 74  Resp: 20  Temp: 97.6 F (36.4 C)  SpO2: 98%   Weight: 135 lb 3.2 oz (61.3 kg)  Height: 5\' 4"  (1.626 m)   Body mass index is 23.21 kg/m. Physical Exam  Constitutional: Oriented to person, place, and time. Well-developed and well-nourished.  HENT:  Head: Normocephalic.  Mouth/Throat: Oropharynx is clear and moist.  Eyes: Pupils are equal, round, and reactive to light.  Neck: Neck supple.  Cardiovascular: Normal rate and normal heart sounds.  No murmur heard. Pulmonary/Chest: Effort normal and breath sounds normal. No respiratory distress. No wheezes. She has no rales.  Abdominal: Soft. Bowel sounds are normal. No distension. There is no tenderness. There is no rebound.  Musculoskeletal: No edema. Right shoulder not swollen or Red. C/o Pain with  Passive Extension and Abduction. No Pain with Flexion Lymphadenopathy: none Neurological: Alert and oriented to person, place, and time.  Skin: Skin  is warm and dry.  Psychiatric: Normal mood and affect. Behavior is normal. Thought content normal.    Labs reviewed: Recent Labs    07/10/20 1105 07/11/20 0000  NA 136 138  K 4.2 4.2  CL 100 104  CO2 22 23*  GLUCOSE 187*  --   BUN 21 21  CREATININE 0.94 1.0  CALCIUM 9.3 9.1   Recent Labs    07/11/20 0000  AST 14  ALT 10  ALKPHOS 53  ALBUMIN 3.5   Recent Labs    07/10/20 1105 07/11/20 0000  WBC 10.2 6.4  NEUTROABS 8.1* 3,725  HGB 12.5 12.1  HCT 37.4 36  MCV 93.3  --   PLT 251 257   Lab Results  Component Value Date   TSH 0.96 07/11/2020   Lab Results  Component Value Date   HGBA1C 6.9 07/11/2020   Lab Results  Component Value Date   CHOL 145 08/20/2019   HDL 32 (L) 08/20/2019   LDLCALC 86 08/20/2019   TRIG 133 08/20/2019   CHOLHDL 4.5 08/20/2019    Significant Diagnostic Results in last 30 days:  No results found.  Assessment/Plan Acute pain of right shoulder Get Xray . Family is refusing for that right now Will try Alleve QD with Tylenol If no Improvement will follow with Ortho  Type 2  diabetes mellitus with other neurologic complication, without long-term current use of insulin (HCC) On Metformin Ataxia Follows with Neurology Continues with therapy Age related osteoporosis, unspecified pathological fracture presence On Fosamax Dyslipidemia On Lipitor     Family/ staff Communication:   Labs/tests ordered:

## 2020-12-30 ENCOUNTER — Other Ambulatory Visit: Payer: Self-pay | Admitting: Orthopaedic Surgery

## 2020-12-30 DIAGNOSIS — M25511 Pain in right shoulder: Secondary | ICD-10-CM

## 2021-01-03 DIAGNOSIS — H524 Presbyopia: Secondary | ICD-10-CM | POA: Diagnosis not present

## 2021-01-03 DIAGNOSIS — H2511 Age-related nuclear cataract, right eye: Secondary | ICD-10-CM | POA: Diagnosis not present

## 2021-01-03 DIAGNOSIS — E119 Type 2 diabetes mellitus without complications: Secondary | ICD-10-CM | POA: Diagnosis not present

## 2021-01-03 DIAGNOSIS — Z961 Presence of intraocular lens: Secondary | ICD-10-CM | POA: Diagnosis not present

## 2021-01-13 ENCOUNTER — Other Ambulatory Visit: Payer: Self-pay

## 2021-01-13 ENCOUNTER — Ambulatory Visit
Admission: RE | Admit: 2021-01-13 | Discharge: 2021-01-13 | Disposition: A | Discharge status: Home / Self Care | Payer: Medicare Other | Source: Ambulatory Visit | Attending: Orthopaedic Surgery | Admitting: Orthopaedic Surgery

## 2021-01-13 DIAGNOSIS — M19011 Primary osteoarthritis, right shoulder: Secondary | ICD-10-CM | POA: Diagnosis not present

## 2021-01-13 DIAGNOSIS — M25511 Pain in right shoulder: Secondary | ICD-10-CM

## 2021-01-16 DIAGNOSIS — M25511 Pain in right shoulder: Secondary | ICD-10-CM | POA: Diagnosis not present

## 2021-02-05 ENCOUNTER — Other Ambulatory Visit: Payer: Self-pay | Admitting: Internal Medicine

## 2021-02-05 DIAGNOSIS — Z1231 Encounter for screening mammogram for malignant neoplasm of breast: Secondary | ICD-10-CM

## 2021-02-11 ENCOUNTER — Non-Acute Institutional Stay: Payer: Medicare Other | Admitting: Nurse Practitioner

## 2021-02-11 ENCOUNTER — Encounter: Payer: Self-pay | Admitting: Nurse Practitioner

## 2021-02-11 DIAGNOSIS — S42294D Other nondisplaced fracture of upper end of right humerus, subsequent encounter for fracture with routine healing: Secondary | ICD-10-CM

## 2021-02-11 DIAGNOSIS — M810 Age-related osteoporosis without current pathological fracture: Secondary | ICD-10-CM

## 2021-02-11 DIAGNOSIS — Z8673 Personal history of transient ischemic attack (TIA), and cerebral infarction without residual deficits: Secondary | ICD-10-CM | POA: Diagnosis not present

## 2021-02-11 DIAGNOSIS — R911 Solitary pulmonary nodule: Secondary | ICD-10-CM | POA: Insufficient documentation

## 2021-02-11 DIAGNOSIS — I10 Essential (primary) hypertension: Secondary | ICD-10-CM

## 2021-02-11 DIAGNOSIS — R419 Unspecified symptoms and signs involving cognitive functions and awareness: Secondary | ICD-10-CM

## 2021-02-11 DIAGNOSIS — E1149 Type 2 diabetes mellitus with other diabetic neurological complication: Secondary | ICD-10-CM

## 2021-02-11 DIAGNOSIS — R32 Unspecified urinary incontinence: Secondary | ICD-10-CM | POA: Diagnosis not present

## 2021-02-11 DIAGNOSIS — K5901 Slow transit constipation: Secondary | ICD-10-CM

## 2021-02-11 DIAGNOSIS — IMO0001 Reserved for inherently not codable concepts without codable children: Secondary | ICD-10-CM

## 2021-02-11 DIAGNOSIS — R270 Ataxia, unspecified: Secondary | ICD-10-CM

## 2021-02-11 NOTE — Assessment & Plan Note (Signed)
takes Ramipril, Atorvastatin, Plavix

## 2021-02-11 NOTE — Assessment & Plan Note (Signed)
Tremor/Ataxia syndrome, frequent falls, last saw Neurology, MRI showed pathologic atrophy cerebellum

## 2021-02-11 NOTE — Assessment & Plan Note (Addendum)
takes Metformin, Hgb a1c 6.9, TSH 0.96 07/2020, due for eye, foot exam.update CBC/diff, CMP/eGFR, Lipid panel, Hgb a1c.

## 2021-02-11 NOTE — Assessment & Plan Note (Addendum)
Incidental lung nodule 01/13/21 2. 5 mm peripheral nodular lesion in the right lung. Recommend chest CT with contrast for further evaluation.  Followed up with the patient's sister HPOA Moody Bruins @ [phone] regarding the incidental finding 73mm nodule in the right lung on the CT scan R shoulder 01/13/21. The decision of watchful waiting has been made with the ordering orthopedic physician.

## 2021-02-11 NOTE — Progress Notes (Addendum)
Location:   [place] Room Number: 34-A Place of Service:  ALF [phone]) Provider: Marlana Latus NP    Patient Care Team: [name], MD as PCP - General (Internal Medicine) [name], DO as Consulting Physician (Neurology)  Extended Emergency Contact Information Primary Emergency Contact: [name],[name] Address: [address]          [place] [address] [place] Phone: [phone] Relation: Sister Secondary Emergency Contact: [facility] Address: [address] [place], [place] [address] [name] of [place] Mobile Phone: [phone] Relation: Brother  Code Status:  FULL CODE Goals of care: Advanced Directive information Advanced Directives 05/29/2021  Does Patient Have a Medical Advance Directive? Yes  Type of Advance Directive Living will;Healthcare Power of Attorney  Does patient want to make changes to medical advance directive? No - Patient declined  Copy of [place] in Chart? Yes - validated most recent copy scanned in chart (See row information)  Would patient like information on creating a medical advance directive? -  Pre-existing out of facility DNR order (yellow form or pink MOST form) -     Chief Complaint  Patient presents with  . Medical Management of Chronic Issues    Routine Visit.  Marland Kitchen Health Maintenance    Discuss the need for hepatitis c screening, urine microalbumin, eye exam, foot exam, and hemoglobin a1c.  . Immunizations    Discuss the need for tetanus vaccine, pna vaccine, and covid booster.    HPI:  Pt is a 80 y.o. female seen today for medical management of chronic diseases  Constipation, takes Senokot S             OP, takes Alendronate, Vit D, Ca             T2DM, takes Metformin, Hgb a1c 6.9, TSH 0.96 07/2020, due for eye, foot exam.              HTN, takes Ramipril, Atorvastatin, Plavix             Tremor/Ataxia syndrome, frequent falls, last saw Neurology, MRI  showed pathologic atrophy cerebellum             Nerocoognitive dysfunction/dementia, affecting frontal subcortical abilities.              Urinary frequency, takes Vesicare.   OA, R shoulder, takes Aleve, Tylenol. 01/13/21 CT R shoulder Healed humeral neck fracture.  Incidental lung nodule 01/13/21 2. 5 mm peripheral nodular lesion in the right lung. Recommend chest CT with contrast for further evaluation.  Hx of TIA, takes Plavix, Atorvastatin.        Past Medical History:  Diagnosis Date  . Diabetes mellitus   . Dyslipidemia   . Hypertension   . Osteoporosis   . Urinary incontinence    History reviewed. No pertinent surgical history.  Allergies  Allergen Reactions  . Amoxicillin-Pot Clavulanate Hives  . Meloxicam     Unknown reaction  . Scallops [Shellfish Allergy] Other (See Comments)    "I don't feel so good after an hour"  . Toviaz [Fesoterodine Fumarate Er]     Unknown reaction    Allergies as of 02/11/2021       Reactions   Amoxicillin-pot Clavulanate Hives   Meloxicam    Unknown reaction   Scallops [shellfish Allergy] Other (See Comments)   "I don't feel so good after an hour"   Toviaz [  fesoterodine Fumarate Er]    Unknown reaction        Medication List        Accurate as of February 11, 2021 11:59 PM. If you have any questions, ask your nurse or doctor.          STOP taking these medications    zinc oxide 20 % ointment Stopped by: Aseel Truxillo X Arrion Broaddus, NP       TAKE these medications    acetaminophen 325 MG tablet Commonly known as: TYLENOL Take 650 mg by mouth 2 (two) times daily.   alendronate 70 MG tablet Commonly known as: FOSAMAX Take 70 mg by mouth once a week. Sundays   atorvastatin 40 MG tablet Commonly known as: LIPITOR Take 1 tablet (40 mg total) by mouth daily.   CALTRATE 600+D PO Take 1 tablet by mouth 2 (two) times daily.   CENTRUM SILVER PO Take 1 tablet by mouth daily.   clopidogrel 75 MG tablet Commonly known as: PLAVIX Take  75 mg by mouth daily.   metFORMIN 500 MG tablet Commonly known as: GLUCOPHAGE Take 500 mg by mouth 2 (two) times daily with a meal.   oxybutynin 5 MG 24 hr tablet Commonly known as: DITROPAN-XL Take 5 mg by mouth at bedtime.   sennosides-docusate sodium 8.6-50 MG tablet Commonly known as: SENOKOT-S Take 1 tablet by mouth in the morning and at bedtime.   VITAMIN D PO Take 1 tablet by mouth daily. 1,000 units        Review of Systems  Constitutional:  Negative for activity change, fever and unexpected weight change.  HENT:  Negative for congestion, trouble swallowing and voice change.   Eyes:  Negative for visual disturbance.  Respiratory:  Negative for shortness of breath and wheezing.   Cardiovascular:  Negative for chest pain, palpitations and leg swelling.  Gastrointestinal:  Negative for abdominal pain and constipation.  Genitourinary:  Positive for frequency. Negative for difficulty urinating.  Musculoskeletal:  Positive for arthralgias and gait problem.  Skin:  Negative for color change.  Neurological:  Positive for tremors. Negative for speech difficulty, weakness and headaches.  Psychiatric/Behavioral:  Negative for behavioral problems and sleep disturbance. The patient is not nervous/anxious.     Immunization History  Administered Date(s) Administered  . Influenza Inj Mdck Quad Pf 08/10/2019  . Influenza-Unspecified 08/13/2020  . Moderna SARS-COV2 Booster Vaccination 04/16/2021  . Moderna Sars-Covid-2 Vaccination 12/11/2019, 01/08/2020  . Zoster, Live 07/01/2020   Pertinent  Health Maintenance Due  Topic Date Due  . FOOT EXAM  Never done  . OPHTHALMOLOGY EXAM  Never done  . URINE MICROALBUMIN  Never done  . PNA vac Low Risk Adult (1 of 2 - PCV13) Never done  . INFLUENZA VACCINE  06/09/2021  . HEMOGLOBIN A1C  08/16/2021  . DEXA SCAN  Completed   Fall Risk  03/05/2020 09/06/2019  Falls in the past year? 1 1  Number falls in past yr: 0 0  Injury with Fall?  1 1   Functional Status Survey:    Vitals:   02/11/21 1508  BP: 107/67  Pulse: 78  Resp: 18  Temp: (!) 97.5 F (36.4 C)  SpO2: 97%  Weight: 134 lb 8 oz (61 kg)  Height: _0  (1.626 m)   Body mass index is 23.09 kg/m. Physical Exam Vitals and nursing note reviewed.  Constitutional:      Appearance: Normal appearance. She is normal weight.  HENT:     Head: Normocephalic.  Comments: Right face bruise, Zygomatic arch fx    Mouth/Throat:     Mouth: Mucous membranes are moist.  Eyes:     Extraocular Movements: Extraocular movements intact.     Conjunctiva/sclera: Conjunctivae normal.     Pupils: Pupils are equal, round, and reactive to light.  Cardiovascular:     Rate and Rhythm: Normal rate and regular rhythm.     Heart sounds: No murmur heard. Pulmonary:     Effort: Pulmonary effort is normal.     Breath sounds: No rales.  Abdominal:     General: Bowel sounds are normal.     Palpations: Abdomen is soft.     Tenderness: There is no abdominal tenderness. There is no rebound.  Musculoskeletal:     Cervical back: Normal range of motion and neck supple.     Right lower leg: No edema.     Left lower leg: No edema.     Comments: Right shoulder pain, chronic since humerus fx,   Skin:    General: Skin is warm and dry.     Findings: No bruising.  Neurological:     General: No focal deficit present.     Mental Status: She is alert and oriented to person, place, and time. Mental status is at baseline.     Motor: No weakness.     Coordination: Coordination abnormal.     Gait: Gait abnormal.  Psychiatric:        Mood and Affect: Mood normal.        Behavior: Behavior normal.        Thought Content: Thought content normal.    Labs reviewed: Recent Labs    07/10/20 1105 07/11/20 0000 02/14/21 0000  NA 136 138 138  K 4.2 4.2 4.1  CL 100 104 105  CO2 22 23* 24*  GLUCOSE 187*  --   --   BUN _0 CREATININE 0.94 1.0 0.8  CALCIUM 9.3 9.1 9.0   Recent Labs     07/11/20 0000 02/14/21 0000  AST 14  --   ALT 10  --   ALKPHOS 53  --   ALBUMIN 3.5 3.7   Recent Labs    07/10/20 1105 07/11/20 0000 02/14/21 0000  WBC 10.2 6.4 6.2  NEUTROABS 8.1* 3,725 3,168.00  HGB 12.5 12.1 12.1  HCT 37.4 36 37  MCV 93.3  --   --   PLT 251 257 248   Lab Results  Component Value Date   TSH 0.96 07/11/2020   Lab Results  Component Value Date   HGBA1C 6.7 02/14/2021   Lab Results  Component Value Date   CHOL 156 02/14/2021   HDL 51 02/14/2021   LDLCALC 83 02/14/2021   TRIG 121 02/14/2021   CHOLHDL 4.5 08/20/2019    Significant Diagnostic Results in last 30 days:  No results found.  Assessment/Plan Lung nodule < 6cm on CT Incidental lung nodule 01/13/21 2. 5 mm peripheral nodular lesion in the right lung. Recommend chest CT with contrast for further evaluation.  Followed up with the patient's sister HPOA Jeral Fruit @ [phone] regarding the incidental finding 88m nodule in the right lung on the CT scan R shoulder 01/13/21. The decision of watchful waiting has been made with the ordering orthopedic physician.   History of TIA (transient ischemic attack) Hx of TIA, takes Plavix, Atorvastatin.   Closed fracture of right proximal humerus R shoulder, takes Aleve, Tylenol. 01/13/21 CT R shoulder Healed humeral neck  fracture.   Urinary incontinence Incontinence and frequency, continue Vesicare.   Neurocognitive disorder Nerocoognitive dysfunction/dementia, affecting frontal subcortical abilities.    Ataxia             Tremor/Ataxia syndrome, frequent falls, last saw Neurology, MRI showed pathologic atrophy cerebellum  Hypertension  takes Ramipril, Atorvastatin, Plavix  T2DM (type 2 diabetes mellitus) ([place])  takes Metformin, Hgb a1c 6.9, TSH 0.96 07/2020, due for eye, foot exam. update CBC/diff, CMP/eGFR, Lipid panel, Hgb a1c.    Osteoporosis takes Alendronate, Vit D, Ca   Slow transit constipation Constipation, takes Senokot  S   Family/ staff Communication: plan of care reviewed with the patient and charge nurse.   Labs/tests ordered: CBC/diff, CMP/eGFR, Hgb a1c, Lipid panel.   Time spend 40 minutes.

## 2021-02-11 NOTE — Assessment & Plan Note (Signed)
R shoulder, takes Aleve, Tylenol. 01/13/21 CT R shoulder Healed humeral neck fracture.

## 2021-02-11 NOTE — Assessment & Plan Note (Signed)
Constipation, takes Senokot S  

## 2021-02-11 NOTE — Assessment & Plan Note (Signed)
Incontinence and frequency, continue Vesicare.

## 2021-02-11 NOTE — Assessment & Plan Note (Signed)
Nerocoognitive dysfunction/dementia, affecting frontal subcortical abilities.

## 2021-02-11 NOTE — Assessment & Plan Note (Signed)
takes Alendronate, Vit D, Ca

## 2021-02-11 NOTE — Assessment & Plan Note (Signed)
Hx of TIA, takes Plavix, Atorvastatin.

## 2021-02-13 DIAGNOSIS — Z13220 Encounter for screening for lipoid disorders: Secondary | ICD-10-CM | POA: Diagnosis not present

## 2021-02-13 DIAGNOSIS — D649 Anemia, unspecified: Secondary | ICD-10-CM | POA: Diagnosis not present

## 2021-02-13 DIAGNOSIS — E119 Type 2 diabetes mellitus without complications: Secondary | ICD-10-CM | POA: Diagnosis not present

## 2021-02-13 DIAGNOSIS — F0390 Unspecified dementia without behavioral disturbance: Secondary | ICD-10-CM | POA: Diagnosis not present

## 2021-02-14 ENCOUNTER — Encounter: Payer: Self-pay | Admitting: Nurse Practitioner

## 2021-02-14 LAB — CBC: RBC: 4.02 (ref 3.87–5.11)

## 2021-02-14 LAB — BASIC METABOLIC PANEL
BUN: 21 (ref 4–21)
CO2: 24 — AB (ref 13–22)
Chloride: 105 (ref 99–108)
Creatinine: 0.8 (ref 0.5–1.1)
Glucose: 134
Potassium: 4.1 (ref 3.4–5.3)
Sodium: 138 (ref 137–147)

## 2021-02-14 LAB — CBC AND DIFFERENTIAL
HCT: 37 (ref 36–46)
Hemoglobin: 12.1 (ref 12.0–16.0)
Neutrophils Absolute: 3168
Platelets: 248 (ref 150–399)
WBC: 6.2

## 2021-02-14 LAB — COMPREHENSIVE METABOLIC PANEL
Albumin: 3.7 (ref 3.5–5.0)
Calcium: 9 (ref 8.7–10.7)
Globulin: 2

## 2021-02-14 LAB — LIPID PANEL
Cholesterol: 156 (ref 0–200)
HDL: 51 (ref 35–70)
LDL Cholesterol: 83
LDl/HDL Ratio: 3.1
Triglycerides: 121 (ref 40–160)

## 2021-02-14 LAB — HEMOGLOBIN A1C: Hemoglobin A1C: 6.7

## 2021-03-10 DIAGNOSIS — R2681 Unsteadiness on feet: Secondary | ICD-10-CM | POA: Diagnosis not present

## 2021-03-10 DIAGNOSIS — Z9181 History of falling: Secondary | ICD-10-CM | POA: Diagnosis not present

## 2021-03-10 DIAGNOSIS — R52 Pain, unspecified: Secondary | ICD-10-CM | POA: Diagnosis not present

## 2021-03-10 DIAGNOSIS — R41841 Cognitive communication deficit: Secondary | ICD-10-CM | POA: Diagnosis not present

## 2021-03-10 DIAGNOSIS — M6281 Muscle weakness (generalized): Secondary | ICD-10-CM | POA: Diagnosis not present

## 2021-03-11 DIAGNOSIS — Z9181 History of falling: Secondary | ICD-10-CM | POA: Diagnosis not present

## 2021-03-11 DIAGNOSIS — R52 Pain, unspecified: Secondary | ICD-10-CM | POA: Diagnosis not present

## 2021-03-11 DIAGNOSIS — R2681 Unsteadiness on feet: Secondary | ICD-10-CM | POA: Diagnosis not present

## 2021-03-11 DIAGNOSIS — M6281 Muscle weakness (generalized): Secondary | ICD-10-CM | POA: Diagnosis not present

## 2021-03-11 DIAGNOSIS — R41841 Cognitive communication deficit: Secondary | ICD-10-CM | POA: Diagnosis not present

## 2021-03-12 DIAGNOSIS — M6281 Muscle weakness (generalized): Secondary | ICD-10-CM | POA: Diagnosis not present

## 2021-03-12 DIAGNOSIS — R2681 Unsteadiness on feet: Secondary | ICD-10-CM | POA: Diagnosis not present

## 2021-03-12 DIAGNOSIS — R41841 Cognitive communication deficit: Secondary | ICD-10-CM | POA: Diagnosis not present

## 2021-03-12 DIAGNOSIS — Z9181 History of falling: Secondary | ICD-10-CM | POA: Diagnosis not present

## 2021-03-12 DIAGNOSIS — R52 Pain, unspecified: Secondary | ICD-10-CM | POA: Diagnosis not present

## 2021-03-13 DIAGNOSIS — M6281 Muscle weakness (generalized): Secondary | ICD-10-CM | POA: Diagnosis not present

## 2021-03-13 DIAGNOSIS — Z9181 History of falling: Secondary | ICD-10-CM | POA: Diagnosis not present

## 2021-03-13 DIAGNOSIS — R2681 Unsteadiness on feet: Secondary | ICD-10-CM | POA: Diagnosis not present

## 2021-03-13 DIAGNOSIS — R52 Pain, unspecified: Secondary | ICD-10-CM | POA: Diagnosis not present

## 2021-03-13 DIAGNOSIS — R41841 Cognitive communication deficit: Secondary | ICD-10-CM | POA: Diagnosis not present

## 2021-03-14 DIAGNOSIS — R41841 Cognitive communication deficit: Secondary | ICD-10-CM | POA: Diagnosis not present

## 2021-03-14 DIAGNOSIS — R52 Pain, unspecified: Secondary | ICD-10-CM | POA: Diagnosis not present

## 2021-03-14 DIAGNOSIS — R2681 Unsteadiness on feet: Secondary | ICD-10-CM | POA: Diagnosis not present

## 2021-03-14 DIAGNOSIS — M6281 Muscle weakness (generalized): Secondary | ICD-10-CM | POA: Diagnosis not present

## 2021-03-14 DIAGNOSIS — Z9181 History of falling: Secondary | ICD-10-CM | POA: Diagnosis not present

## 2021-03-17 DIAGNOSIS — R41841 Cognitive communication deficit: Secondary | ICD-10-CM | POA: Diagnosis not present

## 2021-03-17 DIAGNOSIS — Z9181 History of falling: Secondary | ICD-10-CM | POA: Diagnosis not present

## 2021-03-17 DIAGNOSIS — R52 Pain, unspecified: Secondary | ICD-10-CM | POA: Diagnosis not present

## 2021-03-17 DIAGNOSIS — M6281 Muscle weakness (generalized): Secondary | ICD-10-CM | POA: Diagnosis not present

## 2021-03-17 DIAGNOSIS — R2681 Unsteadiness on feet: Secondary | ICD-10-CM | POA: Diagnosis not present

## 2021-03-18 DIAGNOSIS — R41841 Cognitive communication deficit: Secondary | ICD-10-CM | POA: Diagnosis not present

## 2021-03-18 DIAGNOSIS — M6281 Muscle weakness (generalized): Secondary | ICD-10-CM | POA: Diagnosis not present

## 2021-03-18 DIAGNOSIS — R52 Pain, unspecified: Secondary | ICD-10-CM | POA: Diagnosis not present

## 2021-03-18 DIAGNOSIS — Z9181 History of falling: Secondary | ICD-10-CM | POA: Diagnosis not present

## 2021-03-18 DIAGNOSIS — R2681 Unsteadiness on feet: Secondary | ICD-10-CM | POA: Diagnosis not present

## 2021-03-19 DIAGNOSIS — R41841 Cognitive communication deficit: Secondary | ICD-10-CM | POA: Diagnosis not present

## 2021-03-19 DIAGNOSIS — R2681 Unsteadiness on feet: Secondary | ICD-10-CM | POA: Diagnosis not present

## 2021-03-19 DIAGNOSIS — M6281 Muscle weakness (generalized): Secondary | ICD-10-CM | POA: Diagnosis not present

## 2021-03-19 DIAGNOSIS — R52 Pain, unspecified: Secondary | ICD-10-CM | POA: Diagnosis not present

## 2021-03-19 DIAGNOSIS — Z9181 History of falling: Secondary | ICD-10-CM | POA: Diagnosis not present

## 2021-03-20 ENCOUNTER — Encounter: Payer: Self-pay | Admitting: Internal Medicine

## 2021-03-20 DIAGNOSIS — R41841 Cognitive communication deficit: Secondary | ICD-10-CM | POA: Diagnosis not present

## 2021-03-20 DIAGNOSIS — Z9181 History of falling: Secondary | ICD-10-CM | POA: Diagnosis not present

## 2021-03-20 DIAGNOSIS — M6281 Muscle weakness (generalized): Secondary | ICD-10-CM | POA: Diagnosis not present

## 2021-03-20 DIAGNOSIS — R52 Pain, unspecified: Secondary | ICD-10-CM | POA: Diagnosis not present

## 2021-03-20 DIAGNOSIS — R2681 Unsteadiness on feet: Secondary | ICD-10-CM | POA: Diagnosis not present

## 2021-03-21 DIAGNOSIS — Z9181 History of falling: Secondary | ICD-10-CM | POA: Diagnosis not present

## 2021-03-21 DIAGNOSIS — R41841 Cognitive communication deficit: Secondary | ICD-10-CM | POA: Diagnosis not present

## 2021-03-21 DIAGNOSIS — R52 Pain, unspecified: Secondary | ICD-10-CM | POA: Diagnosis not present

## 2021-03-21 DIAGNOSIS — M6281 Muscle weakness (generalized): Secondary | ICD-10-CM | POA: Diagnosis not present

## 2021-03-21 DIAGNOSIS — R2681 Unsteadiness on feet: Secondary | ICD-10-CM | POA: Diagnosis not present

## 2021-03-24 DIAGNOSIS — R2681 Unsteadiness on feet: Secondary | ICD-10-CM | POA: Diagnosis not present

## 2021-03-24 DIAGNOSIS — M6281 Muscle weakness (generalized): Secondary | ICD-10-CM | POA: Diagnosis not present

## 2021-03-24 DIAGNOSIS — Z9181 History of falling: Secondary | ICD-10-CM | POA: Diagnosis not present

## 2021-03-24 DIAGNOSIS — R52 Pain, unspecified: Secondary | ICD-10-CM | POA: Diagnosis not present

## 2021-03-24 DIAGNOSIS — R41841 Cognitive communication deficit: Secondary | ICD-10-CM | POA: Diagnosis not present

## 2021-03-25 DIAGNOSIS — R52 Pain, unspecified: Secondary | ICD-10-CM | POA: Diagnosis not present

## 2021-03-25 DIAGNOSIS — R2681 Unsteadiness on feet: Secondary | ICD-10-CM | POA: Diagnosis not present

## 2021-03-25 DIAGNOSIS — M6281 Muscle weakness (generalized): Secondary | ICD-10-CM | POA: Diagnosis not present

## 2021-03-25 DIAGNOSIS — R41841 Cognitive communication deficit: Secondary | ICD-10-CM | POA: Diagnosis not present

## 2021-03-25 DIAGNOSIS — Z9181 History of falling: Secondary | ICD-10-CM | POA: Diagnosis not present

## 2021-03-26 DIAGNOSIS — Z9181 History of falling: Secondary | ICD-10-CM | POA: Diagnosis not present

## 2021-03-26 DIAGNOSIS — R52 Pain, unspecified: Secondary | ICD-10-CM | POA: Diagnosis not present

## 2021-03-26 DIAGNOSIS — R2681 Unsteadiness on feet: Secondary | ICD-10-CM | POA: Diagnosis not present

## 2021-03-26 DIAGNOSIS — M6281 Muscle weakness (generalized): Secondary | ICD-10-CM | POA: Diagnosis not present

## 2021-03-26 DIAGNOSIS — R41841 Cognitive communication deficit: Secondary | ICD-10-CM | POA: Diagnosis not present

## 2021-03-27 ENCOUNTER — Ambulatory Visit: Payer: Medicare Other

## 2021-03-27 DIAGNOSIS — R52 Pain, unspecified: Secondary | ICD-10-CM | POA: Diagnosis not present

## 2021-03-27 DIAGNOSIS — M6281 Muscle weakness (generalized): Secondary | ICD-10-CM | POA: Diagnosis not present

## 2021-03-27 DIAGNOSIS — R41841 Cognitive communication deficit: Secondary | ICD-10-CM | POA: Diagnosis not present

## 2021-03-27 DIAGNOSIS — R2681 Unsteadiness on feet: Secondary | ICD-10-CM | POA: Diagnosis not present

## 2021-03-27 DIAGNOSIS — Z9181 History of falling: Secondary | ICD-10-CM | POA: Diagnosis not present

## 2021-03-28 DIAGNOSIS — Z9181 History of falling: Secondary | ICD-10-CM | POA: Diagnosis not present

## 2021-03-28 DIAGNOSIS — M6281 Muscle weakness (generalized): Secondary | ICD-10-CM | POA: Diagnosis not present

## 2021-03-28 DIAGNOSIS — R2681 Unsteadiness on feet: Secondary | ICD-10-CM | POA: Diagnosis not present

## 2021-03-28 DIAGNOSIS — R41841 Cognitive communication deficit: Secondary | ICD-10-CM | POA: Diagnosis not present

## 2021-03-28 DIAGNOSIS — R52 Pain, unspecified: Secondary | ICD-10-CM | POA: Diagnosis not present

## 2021-03-31 DIAGNOSIS — M6281 Muscle weakness (generalized): Secondary | ICD-10-CM | POA: Diagnosis not present

## 2021-03-31 DIAGNOSIS — R41841 Cognitive communication deficit: Secondary | ICD-10-CM | POA: Diagnosis not present

## 2021-03-31 DIAGNOSIS — R2681 Unsteadiness on feet: Secondary | ICD-10-CM | POA: Diagnosis not present

## 2021-03-31 DIAGNOSIS — R52 Pain, unspecified: Secondary | ICD-10-CM | POA: Diagnosis not present

## 2021-03-31 DIAGNOSIS — Z9181 History of falling: Secondary | ICD-10-CM | POA: Diagnosis not present

## 2021-04-01 DIAGNOSIS — Z9181 History of falling: Secondary | ICD-10-CM | POA: Diagnosis not present

## 2021-04-01 DIAGNOSIS — R2681 Unsteadiness on feet: Secondary | ICD-10-CM | POA: Diagnosis not present

## 2021-04-01 DIAGNOSIS — M6281 Muscle weakness (generalized): Secondary | ICD-10-CM | POA: Diagnosis not present

## 2021-04-01 DIAGNOSIS — R41841 Cognitive communication deficit: Secondary | ICD-10-CM | POA: Diagnosis not present

## 2021-04-01 DIAGNOSIS — R52 Pain, unspecified: Secondary | ICD-10-CM | POA: Diagnosis not present

## 2021-04-02 DIAGNOSIS — R2681 Unsteadiness on feet: Secondary | ICD-10-CM | POA: Diagnosis not present

## 2021-04-02 DIAGNOSIS — R52 Pain, unspecified: Secondary | ICD-10-CM | POA: Diagnosis not present

## 2021-04-02 DIAGNOSIS — M6281 Muscle weakness (generalized): Secondary | ICD-10-CM | POA: Diagnosis not present

## 2021-04-02 DIAGNOSIS — R41841 Cognitive communication deficit: Secondary | ICD-10-CM | POA: Diagnosis not present

## 2021-04-02 DIAGNOSIS — Z9181 History of falling: Secondary | ICD-10-CM | POA: Diagnosis not present

## 2021-04-04 DIAGNOSIS — Z9181 History of falling: Secondary | ICD-10-CM | POA: Diagnosis not present

## 2021-04-04 DIAGNOSIS — R2681 Unsteadiness on feet: Secondary | ICD-10-CM | POA: Diagnosis not present

## 2021-04-04 DIAGNOSIS — R41841 Cognitive communication deficit: Secondary | ICD-10-CM | POA: Diagnosis not present

## 2021-04-04 DIAGNOSIS — M6281 Muscle weakness (generalized): Secondary | ICD-10-CM | POA: Diagnosis not present

## 2021-04-04 DIAGNOSIS — R52 Pain, unspecified: Secondary | ICD-10-CM | POA: Diagnosis not present

## 2021-04-07 DIAGNOSIS — R41841 Cognitive communication deficit: Secondary | ICD-10-CM | POA: Diagnosis not present

## 2021-04-07 DIAGNOSIS — R2681 Unsteadiness on feet: Secondary | ICD-10-CM | POA: Diagnosis not present

## 2021-04-07 DIAGNOSIS — Z9181 History of falling: Secondary | ICD-10-CM | POA: Diagnosis not present

## 2021-04-07 DIAGNOSIS — M6281 Muscle weakness (generalized): Secondary | ICD-10-CM | POA: Diagnosis not present

## 2021-04-07 DIAGNOSIS — R52 Pain, unspecified: Secondary | ICD-10-CM | POA: Diagnosis not present

## 2021-04-08 DIAGNOSIS — Z9181 History of falling: Secondary | ICD-10-CM | POA: Diagnosis not present

## 2021-04-08 DIAGNOSIS — R52 Pain, unspecified: Secondary | ICD-10-CM | POA: Diagnosis not present

## 2021-04-08 DIAGNOSIS — R2681 Unsteadiness on feet: Secondary | ICD-10-CM | POA: Diagnosis not present

## 2021-04-08 DIAGNOSIS — M6281 Muscle weakness (generalized): Secondary | ICD-10-CM | POA: Diagnosis not present

## 2021-04-08 DIAGNOSIS — R41841 Cognitive communication deficit: Secondary | ICD-10-CM | POA: Diagnosis not present

## 2021-04-09 DIAGNOSIS — Z9181 History of falling: Secondary | ICD-10-CM | POA: Diagnosis not present

## 2021-04-09 DIAGNOSIS — R52 Pain, unspecified: Secondary | ICD-10-CM | POA: Diagnosis not present

## 2021-04-09 DIAGNOSIS — M6281 Muscle weakness (generalized): Secondary | ICD-10-CM | POA: Diagnosis not present

## 2021-04-09 DIAGNOSIS — R2681 Unsteadiness on feet: Secondary | ICD-10-CM | POA: Diagnosis not present

## 2021-04-10 DIAGNOSIS — R52 Pain, unspecified: Secondary | ICD-10-CM | POA: Diagnosis not present

## 2021-04-10 DIAGNOSIS — Z9181 History of falling: Secondary | ICD-10-CM | POA: Diagnosis not present

## 2021-04-10 DIAGNOSIS — R2681 Unsteadiness on feet: Secondary | ICD-10-CM | POA: Diagnosis not present

## 2021-04-10 DIAGNOSIS — M6281 Muscle weakness (generalized): Secondary | ICD-10-CM | POA: Diagnosis not present

## 2021-04-11 DIAGNOSIS — R52 Pain, unspecified: Secondary | ICD-10-CM | POA: Diagnosis not present

## 2021-04-11 DIAGNOSIS — Z9181 History of falling: Secondary | ICD-10-CM | POA: Diagnosis not present

## 2021-04-11 DIAGNOSIS — M6281 Muscle weakness (generalized): Secondary | ICD-10-CM | POA: Diagnosis not present

## 2021-04-11 DIAGNOSIS — R2681 Unsteadiness on feet: Secondary | ICD-10-CM | POA: Diagnosis not present

## 2021-04-14 DIAGNOSIS — R52 Pain, unspecified: Secondary | ICD-10-CM | POA: Diagnosis not present

## 2021-04-14 DIAGNOSIS — Z9181 History of falling: Secondary | ICD-10-CM | POA: Diagnosis not present

## 2021-04-14 DIAGNOSIS — R2681 Unsteadiness on feet: Secondary | ICD-10-CM | POA: Diagnosis not present

## 2021-04-14 DIAGNOSIS — M6281 Muscle weakness (generalized): Secondary | ICD-10-CM | POA: Diagnosis not present

## 2021-05-19 DIAGNOSIS — R4789 Other speech disturbances: Secondary | ICD-10-CM | POA: Diagnosis not present

## 2021-05-19 DIAGNOSIS — F0390 Unspecified dementia without behavioral disturbance: Secondary | ICD-10-CM | POA: Diagnosis not present

## 2021-05-21 DIAGNOSIS — R4789 Other speech disturbances: Secondary | ICD-10-CM | POA: Diagnosis not present

## 2021-05-21 DIAGNOSIS — F0390 Unspecified dementia without behavioral disturbance: Secondary | ICD-10-CM | POA: Diagnosis not present

## 2021-05-22 DIAGNOSIS — F0390 Unspecified dementia without behavioral disturbance: Secondary | ICD-10-CM | POA: Diagnosis not present

## 2021-05-22 DIAGNOSIS — R4789 Other speech disturbances: Secondary | ICD-10-CM | POA: Diagnosis not present

## 2021-05-26 DIAGNOSIS — F0390 Unspecified dementia without behavioral disturbance: Secondary | ICD-10-CM | POA: Diagnosis not present

## 2021-05-26 DIAGNOSIS — R4789 Other speech disturbances: Secondary | ICD-10-CM | POA: Diagnosis not present

## 2021-05-28 DIAGNOSIS — R4789 Other speech disturbances: Secondary | ICD-10-CM | POA: Diagnosis not present

## 2021-05-28 DIAGNOSIS — F0390 Unspecified dementia without behavioral disturbance: Secondary | ICD-10-CM | POA: Diagnosis not present

## 2021-05-29 ENCOUNTER — Encounter: Payer: Self-pay | Admitting: Internal Medicine

## 2021-05-29 ENCOUNTER — Non-Acute Institutional Stay: Payer: Medicare Other | Admitting: Internal Medicine

## 2021-05-29 DIAGNOSIS — E1149 Type 2 diabetes mellitus with other diabetic neurological complication: Secondary | ICD-10-CM | POA: Diagnosis not present

## 2021-05-29 DIAGNOSIS — R419 Unspecified symptoms and signs involving cognitive functions and awareness: Secondary | ICD-10-CM | POA: Diagnosis not present

## 2021-05-29 DIAGNOSIS — Z8673 Personal history of transient ischemic attack (TIA), and cerebral infarction without residual deficits: Secondary | ICD-10-CM

## 2021-05-29 DIAGNOSIS — M810 Age-related osteoporosis without current pathological fracture: Secondary | ICD-10-CM | POA: Diagnosis not present

## 2021-05-29 DIAGNOSIS — R32 Unspecified urinary incontinence: Secondary | ICD-10-CM

## 2021-05-29 DIAGNOSIS — E785 Hyperlipidemia, unspecified: Secondary | ICD-10-CM

## 2021-05-29 DIAGNOSIS — I10 Essential (primary) hypertension: Secondary | ICD-10-CM

## 2021-05-29 NOTE — Progress Notes (Signed)
[facility] Room [facility] 23 Place of Service:  ALF [phone]) Provider:    Mahlon Gammon, MD  Patient Care Team: Mahlon Gammon, MD as PCP - General (Internal Medicine) [name], DO as Consulting Physician (Neurology)  Extended Emergency Contact Information Primary Emergency Contact: [name],[name] Address: [address]          [place] [address] [place] Home Phone: [phone] Relation: Sister Secondary Emergency Contact: [facility] Address: [address] [place], [place] [address] [name] Mobile Phone: [phone] Relation: Brother  Code Status:   Goals of care: Advanced Directive information Advanced Directives 05/29/2021  Does Patient Have a Medical Advance Directive? Yes  Type of Advance Directive Living will;Healthcare Power of Attorney  Does patient want to make changes to medical advance directive? No - Patient declined  Copy of Healthcare Power of Attorney in Chart? Yes - validated most recent copy scanned in chart (See row information)  Would patient like information on creating a medical advance directive? -  Pre-existing out of facility DNR order (yellow form or pink MOST form) -     Chief Complaint  Patient presents with   Medical Management of Chronic Issues   Health Maintenance    Foot and eye exam, urine microalbumin, Hepatitis C scree, TDAP, Shingrix, PCV13, hemoglobin A1C    HPI:  Pt is a 80 y.o. female seen today for medical management of chronic diseases.    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 History of closed displaced fracture of proximal end of right humerus needing Soft Cast  Patient is now on AL. Cannot walk anymore Uses Wheelchair to go around Can do her transfers with no assist No Dizziness or falls No Pain   Past Medical History:  Diagnosis Date    Diabetes mellitus    Dyslipidemia    Hypertension    Osteoporosis    Urinary incontinence    History reviewed. No pertinent surgical history.  Allergies  Allergen Reactions   Amoxicillin-Pot Clavulanate Hives   Meloxicam     Unknown reaction   Scallops [Shellfish Allergy] Other (See Comments)    "I don't feel so good after an hour"   Toviaz [Fesoterodine Fumarate Er]     Unknown reaction    Allergies as of 05/29/2021       Reactions   Amoxicillin-pot Clavulanate Hives   Meloxicam    Unknown reaction   Scallops [shellfish Allergy] Other (See Comments)   "I don't feel so good after an hour"   Toviaz [fesoterodine Fumarate Er]    Unknown reaction        Medication List        Accurate as of May 29, 2021  2:51 PM. If you have any questions, ask your nurse or doctor.          acetaminophen 325 MG tablet Commonly known as: TYLENOL Take 650 mg by mouth 2 (two) times daily.   alendronate 70 MG tablet Commonly known as: FOSAMAX Take 70 mg by mouth once a week. Sundays   atorvastatin 40 MG tablet Commonly known as: LIPITOR Take 1 tablet (40 mg total) by mouth daily.   CALTRATE 600+D PO Take 1 tablet by mouth 2 (two) times daily.   CENTRUM SILVER PO Take 1 tablet by mouth daily.   clopidogrel 75  MG tablet Commonly known as: PLAVIX Take 75 mg by mouth daily.   metFORMIN 500 MG tablet Commonly known as: GLUCOPHAGE Take 500 mg by mouth 2 (two) times daily with a meal.   oxybutynin 5 MG 24 hr tablet Commonly known as: DITROPAN-XL Take 5 mg by mouth at bedtime.   sennosides-docusate sodium 8.6-50 MG tablet Commonly known as: SENOKOT-S Take 1 tablet by mouth in the morning and at bedtime.   VITAMIN D PO Take 1 tablet by mouth daily. 1,000 units        Review of Systems Review of Systems  Constitutional: Negative for activity change, appetite change, chills, diaphoresis, fatigue and fever.  HENT: Negative for mouth sores, postnasal drip,  rhinorrhea, sinus pain and sore throat.   Respiratory: Negative for apnea, cough, chest tightness, shortness of breath and wheezing.   Cardiovascular: Negative for chest pain, palpitations and leg swelling.  Gastrointestinal: Negative for abdominal distention, abdominal pain, constipation, diarrhea, nausea and vomiting.  Genitourinary: Negative for dysuria and frequency.  Musculoskeletal: Negative for arthralgias, joint swelling and myalgias.  Skin: Negative for rash.  Neurological: Negative for dizziness, syncope, weakness, light-headedness and numbness.  Psychiatric/Behavioral: Negative for behavioral problems, confusion and sleep disturbance.    Immunization History  Administered Date(s) Administered   Influenza Inj Mdck Quad Pf 08/10/2019   Influenza-Unspecified 08/13/2020   Moderna SARS-COV2 Booster Vaccination 04/16/2021   Moderna Sars-Covid-2 Vaccination 12/11/2019, 01/08/2020   Zoster, Live 07/01/2020   Pertinent  Health Maintenance Due  Topic Date Due   FOOT EXAM  Never done   OPHTHALMOLOGY EXAM  Never done   URINE MICROALBUMIN  Never done   PNA vac Low Risk Adult (1 of 2 - PCV13) Never done   INFLUENZA VACCINE  06/09/2021   HEMOGLOBIN A1C  08/16/2021   DEXA SCAN  Completed   Fall Risk  03/05/2020 09/06/2019  Falls in the past year? 1 1  Number falls in past yr: 0 0  Injury with Fall? 1 1   Functional Status Survey:    Vitals:   05/29/21 1201  BP: 111/76  Pulse: 77  Resp: 18  Temp: 97.7 F (36.5 C)  SpO2: 97%  Weight: 148 lb 3.2 oz (67.2 kg)  Height: 5\' 4"  (1.626 m)   Body mass index is 25.44 kg/m. Physical Exam Constitutional: Oriented to person, place, and time. Well-developed and well-nourished.  HENT:  Head: Normocephalic.  Mouth/Throat: Oropharynx is clear and moist.  Eyes: Pupils are equal, round, and reactive to light.  Neck: Neck supple.  Breast Exam Negative Exam today  Cardiovascular: Normal rate and normal heart sounds.  No murmur  heard. Pulmonary/Chest: Effort normal and breath sounds normal. No respiratory distress. No wheezes. She has no rales.  Abdominal: Soft. Bowel sounds are normal. No distension. There is no tenderness. There is no rebound.  Musculoskeletal: No edema.  Lymphadenopathy: none Neurological: Alert and oriented to person, place, and time.   Skin: Skin is warm and dry.  Psychiatric: Normal mood and affect. Behavior is normal. Thought content normal.   Labs reviewed: Recent Labs    07/10/20 1105 07/11/20 0000 02/14/21 0000  NA 136 138 138  K 4.2 4.2 4.1  CL 100 104 105  CO2 22 23* 24*  GLUCOSE 187*  --   --   BUN 21 21 21   CREATININE 0.94 1.0 0.8  CALCIUM 9.3 9.1 9.0   Recent Labs    07/11/20 0000 02/14/21 0000  AST 14  --   ALT 10  --  ALKPHOS 53  --   ALBUMIN 3.5 3.7   Recent Labs    07/10/20 1105 07/11/20 0000 02/14/21 0000  WBC 10.2 6.4 6.2  NEUTROABS 8.1* 3,725 3,168.00  HGB 12.5 12.1 12.1  HCT 37.4 36 37  MCV 93.3  --   --   PLT 251 257 248   Lab Results  Component Value Date   TSH 0.96 07/11/2020   Lab Results  Component Value Date   HGBA1C 6.7 02/14/2021   Lab Results  Component Value Date   CHOL 156 02/14/2021   HDL 51 02/14/2021   LDLCALC 83 02/14/2021   TRIG 121 02/14/2021   CHOLHDL 4.5 08/20/2019    Significant Diagnostic Results in last 30 days:  No results found.  Assessment/Plan Type 2 diabetes mellitus with other neurologic complication, without long-term current use of insulin (HCC) A1C in good range On Metformin Repeat A1C per her request Off ACE inhibitors due to low BP and Dizziness  Neurocognitive disorder Doing well in AL Age related osteoporosis, unspecified pathological fracture presence Fosamax  Needs repeat DEXA Dyslipidemia Continue statin Last LDL 83 History of TIA (transient ischemic attack) On Plavix and Statin Urinary incontinence, unspecified type On Ditropan Lung Nodule seen on Xray done for her  Shoulder Per [place] her sister wants to Follow with Xray in few months Write for Xray in the facility Also wants to delay Mammogram for another year With DEXA Family/ staff Communication:    Labs/tests ordered:  CBC,CMP A1C  .

## 2021-05-30 DIAGNOSIS — F0390 Unspecified dementia without behavioral disturbance: Secondary | ICD-10-CM | POA: Diagnosis not present

## 2021-05-30 DIAGNOSIS — R4789 Other speech disturbances: Secondary | ICD-10-CM | POA: Diagnosis not present

## 2021-06-03 ENCOUNTER — Telehealth: Payer: Self-pay | Admitting: Nurse Practitioner

## 2021-06-03 NOTE — Telephone Encounter (Signed)
Followed up with the patient's sister HPOA Moody Bruins @ [phone] regarding the incidental finding 3mm nodule in the right lung on the CT scan R shoulder 01/13/21. The decision of watchful waiting has been made with the ordering orthopedic physician.

## 2021-06-09 DIAGNOSIS — M6281 Muscle weakness (generalized): Secondary | ICD-10-CM | POA: Diagnosis not present

## 2021-06-09 DIAGNOSIS — R279 Unspecified lack of coordination: Secondary | ICD-10-CM | POA: Diagnosis not present

## 2021-06-09 DIAGNOSIS — Z9181 History of falling: Secondary | ICD-10-CM | POA: Diagnosis not present

## 2021-06-09 DIAGNOSIS — R2681 Unsteadiness on feet: Secondary | ICD-10-CM | POA: Diagnosis not present

## 2021-06-10 DIAGNOSIS — R2681 Unsteadiness on feet: Secondary | ICD-10-CM | POA: Diagnosis not present

## 2021-06-10 DIAGNOSIS — M6281 Muscle weakness (generalized): Secondary | ICD-10-CM | POA: Diagnosis not present

## 2021-06-10 DIAGNOSIS — R279 Unspecified lack of coordination: Secondary | ICD-10-CM | POA: Diagnosis not present

## 2021-06-10 DIAGNOSIS — Z9181 History of falling: Secondary | ICD-10-CM | POA: Diagnosis not present

## 2021-06-11 DIAGNOSIS — Z9181 History of falling: Secondary | ICD-10-CM | POA: Diagnosis not present

## 2021-06-11 DIAGNOSIS — R2681 Unsteadiness on feet: Secondary | ICD-10-CM | POA: Diagnosis not present

## 2021-06-11 DIAGNOSIS — R279 Unspecified lack of coordination: Secondary | ICD-10-CM | POA: Diagnosis not present

## 2021-06-11 DIAGNOSIS — M6281 Muscle weakness (generalized): Secondary | ICD-10-CM | POA: Diagnosis not present

## 2021-06-12 DIAGNOSIS — R279 Unspecified lack of coordination: Secondary | ICD-10-CM | POA: Diagnosis not present

## 2021-06-12 DIAGNOSIS — M6281 Muscle weakness (generalized): Secondary | ICD-10-CM | POA: Diagnosis not present

## 2021-06-12 DIAGNOSIS — Z9181 History of falling: Secondary | ICD-10-CM | POA: Diagnosis not present

## 2021-06-12 DIAGNOSIS — R2681 Unsteadiness on feet: Secondary | ICD-10-CM | POA: Diagnosis not present

## 2021-06-13 DIAGNOSIS — R229 Localized swelling, mass and lump, unspecified: Secondary | ICD-10-CM | POA: Diagnosis not present

## 2021-06-14 DIAGNOSIS — R2681 Unsteadiness on feet: Secondary | ICD-10-CM | POA: Diagnosis not present

## 2021-06-14 DIAGNOSIS — Z9181 History of falling: Secondary | ICD-10-CM | POA: Diagnosis not present

## 2021-06-14 DIAGNOSIS — R279 Unspecified lack of coordination: Secondary | ICD-10-CM | POA: Diagnosis not present

## 2021-06-14 DIAGNOSIS — M6281 Muscle weakness (generalized): Secondary | ICD-10-CM | POA: Diagnosis not present

## 2021-06-16 DIAGNOSIS — R2681 Unsteadiness on feet: Secondary | ICD-10-CM | POA: Diagnosis not present

## 2021-06-16 DIAGNOSIS — R279 Unspecified lack of coordination: Secondary | ICD-10-CM | POA: Diagnosis not present

## 2021-06-16 DIAGNOSIS — M6281 Muscle weakness (generalized): Secondary | ICD-10-CM | POA: Diagnosis not present

## 2021-06-16 DIAGNOSIS — Z9181 History of falling: Secondary | ICD-10-CM | POA: Diagnosis not present

## 2021-06-18 DIAGNOSIS — M6281 Muscle weakness (generalized): Secondary | ICD-10-CM | POA: Diagnosis not present

## 2021-06-18 DIAGNOSIS — R2681 Unsteadiness on feet: Secondary | ICD-10-CM | POA: Diagnosis not present

## 2021-06-18 DIAGNOSIS — Z9181 History of falling: Secondary | ICD-10-CM | POA: Diagnosis not present

## 2021-06-18 DIAGNOSIS — R279 Unspecified lack of coordination: Secondary | ICD-10-CM | POA: Diagnosis not present

## 2021-06-19 DIAGNOSIS — E119 Type 2 diabetes mellitus without complications: Secondary | ICD-10-CM | POA: Diagnosis not present

## 2021-06-19 DIAGNOSIS — F0390 Unspecified dementia without behavioral disturbance: Secondary | ICD-10-CM | POA: Diagnosis not present

## 2021-06-19 DIAGNOSIS — D649 Anemia, unspecified: Secondary | ICD-10-CM | POA: Diagnosis not present

## 2021-06-20 DIAGNOSIS — R279 Unspecified lack of coordination: Secondary | ICD-10-CM | POA: Diagnosis not present

## 2021-06-20 DIAGNOSIS — R2681 Unsteadiness on feet: Secondary | ICD-10-CM | POA: Diagnosis not present

## 2021-06-20 DIAGNOSIS — M6281 Muscle weakness (generalized): Secondary | ICD-10-CM | POA: Diagnosis not present

## 2021-06-20 DIAGNOSIS — Z9181 History of falling: Secondary | ICD-10-CM | POA: Diagnosis not present

## 2021-06-23 DIAGNOSIS — M6281 Muscle weakness (generalized): Secondary | ICD-10-CM | POA: Diagnosis not present

## 2021-06-23 DIAGNOSIS — R279 Unspecified lack of coordination: Secondary | ICD-10-CM | POA: Diagnosis not present

## 2021-06-23 DIAGNOSIS — R2681 Unsteadiness on feet: Secondary | ICD-10-CM | POA: Diagnosis not present

## 2021-06-23 DIAGNOSIS — Z9181 History of falling: Secondary | ICD-10-CM | POA: Diagnosis not present

## 2021-06-25 ENCOUNTER — Encounter: Payer: Self-pay | Admitting: Orthopedic Surgery

## 2021-06-25 ENCOUNTER — Non-Acute Institutional Stay (INDEPENDENT_AMBULATORY_CARE_PROVIDER_SITE_OTHER): Payer: Medicare Other | Admitting: Orthopedic Surgery

## 2021-06-25 DIAGNOSIS — Z0000 Encounter for general adult medical examination without abnormal findings: Secondary | ICD-10-CM

## 2021-06-25 NOTE — Progress Notes (Signed)
Provider:  Hazle Nordmann, NP Location:  [facility]    Place of Service:      PCP: Mahlon Gammon, MD Patient Care Team: Mahlon Gammon, MD as PCP - General (Internal Medicine) [name], DO as Consulting Physician (Neurology)  Extended Emergency Contact Information Primary Emergency Contact: [name],[name] Address: [address]          [place] [address] [place] Home Phone: [phone] Relation: Sister Secondary Emergency Contact: [facility] Address: [address] [place], [place] [address] [name] Mobile Phone: [phone] Relation: Brother  Code Status: DNR Goals of Care: Advanced Directive information Advanced Directives 06/25/2021  Does Patient Have a Medical Advance Directive? Yes  Type of Estate agent of [place];Living will  Does patient want to make changes to medical advance directive? No - Patient declined  Copy of Healthcare Power of Attorney in Chart? Yes - validated most recent copy scanned in chart (See row information)  Would patient like information on creating a medical advance directive? -  Pre-existing out of facility DNR order (yellow form or pink MOST form) -     Chief Complaint  Patient presents with   Medicare Wellness    Annual wellness    HPI: Patient is a 80 y.o. female seen today for an annual comprehensive examination.  Past Medical History:  Diagnosis Date   Diabetes mellitus    Dyslipidemia    Hypertension    Osteoporosis    Urinary incontinence    No past surgical history on file.  reports that she has never smoked. She has never used smokeless tobacco. She reports that she does not drink alcohol and does not use drugs. Social History   Socioeconomic History   Marital status: Single    Spouse name: Not on file   Number of children: 0   Years of education: Not on file   Highest education level: Master's degree (e.g., MA, MS, MEng, MEd, MSW, MBA)   Occupational History   Occupation: retired  Tobacco Use   Smoking status: Never   Smokeless tobacco: Never  Vaping Use   Vaping Use: Never used  Substance and Sexual Activity   Alcohol use: No   Drug use: No   Sexual activity: Not on file  Other Topics Concern   Not on file  Social History Narrative   Social Review      Patient lives at home alone   Pt lives in a one or two story home?one story home   Private home   What is patient highest level of education?Masters Degree   Is patient RIGHT or LEFT handed? RIGHT HANDED   Does patient drink coffee, tea, soda? YES    How much coffee, tea, soda does patient drink? SODA only at lunch, sometimes 1-2 times a week   Does patient exercise regularly? NO   Social Determinants of Corporate investment banker Strain: Not on file  Food Insecurity: Not on file  Transportation Needs: Not on file  Physical Activity: Not on file  Stress: Not on file  Social Connections: Not on file  Intimate Partner Violence: Not on file   Family History  Problem Relation Age of Onset   Fragile X syndrome Father    Healthy Sister    Breast cancer Neg Hx    Stroke Neg Hx     Pertinent  Health Maintenance Due  Topic Date Due  FOOT EXAM  Never done   OPHTHALMOLOGY EXAM  Never done   URINE MICROALBUMIN  Never done   PNA vac Low Risk Adult (1 of 2 - PCV13) Never done   INFLUENZA VACCINE  06/09/2021   HEMOGLOBIN A1C  08/16/2021   DEXA SCAN  Completed   Fall Risk  03/05/2020 09/06/2019  Falls in the past year? 1 1  Number falls in past yr: 0 0  Injury with Fall? 1 1   No flowsheet data found.  Functional Status Survey:    Allergies  Allergen Reactions   Amoxicillin-Pot Clavulanate Hives   Meloxicam     Unknown reaction   Scallops [Shellfish Allergy] Other (See Comments)    "I don't feel so good after an hour"   Toviaz [Fesoterodine Fumarate Er]     Unknown reaction    Allergies as of 06/25/2021       Reactions   Amoxicillin-pot  Clavulanate Hives   Meloxicam    Unknown reaction   Scallops [shellfish Allergy] Other (See Comments)   "I don't feel so good after an hour"   Toviaz [fesoterodine Fumarate Er]    Unknown reaction        Medication List        Accurate as of June 25, 2021 11:31 AM. If you have any questions, ask your nurse or doctor.          acetaminophen 325 MG tablet Commonly known as: TYLENOL Take 650 mg by mouth 2 (two) times daily.   alendronate 70 MG tablet Commonly known as: FOSAMAX Take 70 mg by mouth once a week. Sundays   atorvastatin 40 MG tablet Commonly known as: LIPITOR Take 1 tablet (40 mg total) by mouth daily.   CALTRATE 600+D PO Take 1 tablet by mouth 2 (two) times daily.   CENTRUM SILVER PO Take 1 tablet by mouth daily.   clopidogrel 75 MG tablet Commonly known as: PLAVIX Take 75 mg by mouth daily.   metFORMIN 500 MG tablet Commonly known as: GLUCOPHAGE Take 500 mg by mouth 2 (two) times daily with a meal.   oxybutynin 5 MG 24 hr tablet Commonly known as: DITROPAN-XL Take 5 mg by mouth at bedtime.   sennosides-docusate sodium 8.6-50 MG tablet Commonly known as: SENOKOT-S Take 1 tablet by mouth in the morning and at bedtime.   VITAMIN D PO Take 1 tablet by mouth daily. 1,000 units        Review of Systems  Vitals:   06/25/21 1128  BP: 136/80  Pulse: 80  Resp: 20  Temp: (!) 97.4 F (36.3 C)  SpO2: 99%  Weight: 148 lb 12.8 oz (67.5 kg)  Height: 5\' 4"  (1.626 m)   Body mass index is 25.54 kg/m. Physical Exam  Labs reviewed: Basic Metabolic Panel: Recent Labs    07/10/20 1105 07/11/20 0000 02/14/21 0000  NA 136 138 138  K 4.2 4.2 4.1  CL 100 104 105  CO2 22 23* 24*  GLUCOSE 187*  --   --   BUN 21 21 21   CREATININE 0.94 1.0 0.8  CALCIUM 9.3 9.1 9.0   Liver Function Tests: Recent Labs    07/11/20 0000 02/14/21 0000  AST 14  --   ALT 10  --   ALKPHOS 53  --   ALBUMIN 3.5 3.7   No results for input(s): LIPASE, AMYLASE  in the last 8760 hours. No results for input(s): AMMONIA in the last 8760 hours. CBC: Recent Labs  07/10/20 1105 07/11/20 0000 02/14/21 0000  WBC 10.2 6.4 6.2  NEUTROABS 8.1* 3,725 3,168.00  HGB 12.5 12.1 12.1  HCT 37.4 36 37  MCV 93.3  --   --   PLT 251 257 248   Cardiac Enzymes: No results for input(s): CKTOTAL, CKMB, CKMBINDEX, TROPONINI in the last 8760 hours. BNP: Invalid input(s): POCBNP Lab Results  Component Value Date   HGBA1C 6.7 02/14/2021   Lab Results  Component Value Date   TSH 0.96 07/11/2020   No results found for: VITAMINB12 No results found for: FOLATE No results found for: IRON, TIBC, FERRITIN  Imaging and Procedures obtained recently: No results found.  Assessment/Plan There are no diagnoses linked to this encounter.   Family/ staff Communication:   Labs/tests ordered:

## 2021-06-25 NOTE — Progress Notes (Signed)
Subjective:   Jaclyn Guzman is a 80 y.o. female who presents for Medicare Annual (Subsequent) preventive examination.  Review of Systems     Cardiac Risk Factors include: advanced age (>25men, >21 women);hypertension;sedentary lifestyle     Objective:    Today's Vitals   06/25/21 1128  BP: 136/80  Pulse: 80  Resp: 20  Temp: (!) 97.4 F (36.3 C)  SpO2: 99%  Weight: 148 lb 12.8 oz (67.5 kg)  Height: 5\' 4"  (1.626 m)   Body mass index is 25.54 kg/m.  Advanced Directives 06/25/2021 05/29/2021 02/11/2021 10/10/2020 08/20/2020 07/11/2020 07/10/2020  Does Patient Have a Medical Advance Directive? Yes Yes Yes Yes Yes Yes No  Type of 09/09/2020 of [place];Living will Living will;Healthcare Power of [place] Power of [place];Living will Healthcare Power of [place];Living will;Out of facility DNR (pink MOST or yellow form) Healthcare Power of Attorney Living will;Out of facility DNR (pink MOST or yellow form) -  Does patient want to make changes to medical advance directive? No - Patient declined No - Patient declined No - Patient declined No - Patient declined No - Patient declined No - Patient declined -  Copy of Healthcare Power of Attorney in Chart? Yes - validated most recent copy scanned in chart (See row information) Yes - validated most recent copy scanned in chart (See row information) Yes - validated most recent copy scanned in chart (See row information) Yes - validated most recent copy scanned in chart (See row information) Yes - validated most recent copy scanned in chart (See row information) - -  Would patient like information on creating a medical advance directive? - - - - - - No - Patient declined  Pre-existing out of facility DNR order (yellow form or pink MOST form) - - - Pink MOST form placed in chart (order not valid for inpatient use) Pink MOST form placed in chart (order not valid for inpatient use) Pink MOST form placed in chart (order not  valid for inpatient use) -    Current Medications (verified) Outpatient Encounter Medications as of 06/25/2021  Medication Sig   acetaminophen (TYLENOL) 325 MG tablet Take 650 mg by mouth 2 (two) times daily.   alendronate (FOSAMAX) 70 MG tablet Take 70 mg by mouth once a week. Sundays   atorvastatin (LIPITOR) 40 MG tablet Take 1 tablet (40 mg total) by mouth daily.   Calcium Carbonate-Vitamin D (CALTRATE 600+D PO) Take 1 tablet by mouth 2 (two) times daily.   clopidogrel (PLAVIX) 75 MG tablet Take 75 mg by mouth daily.   metFORMIN (GLUCOPHAGE) 500 MG tablet Take 500 mg by mouth 2 (two) times daily with a meal.    Multiple Vitamins-Minerals (CENTRUM SILVER PO) Take 1 tablet by mouth daily.   oxybutynin (DITROPAN-XL) 5 MG 24 hr tablet Take 5 mg by mouth at bedtime.   sennosides-docusate sodium (SENOKOT-S) 8.6-50 MG tablet Take 1 tablet by mouth in the morning and at bedtime.   VITAMIN D PO Take 1 tablet by mouth daily. 1,000 units   No facility-administered encounter medications on file as of 06/25/2021.    Allergies (verified) Amoxicillin-pot clavulanate, Meloxicam, Scallops [shellfish allergy], and Toviaz [fesoterodine fumarate er]   History: Past Medical History:  Diagnosis Date   Diabetes mellitus    Dyslipidemia    Hypertension    Osteoporosis    Urinary incontinence    No past surgical history on file. Family History  Problem Relation Age of Onset   Fragile X  syndrome Father    Healthy Sister    Breast cancer Neg Hx    Stroke Neg Hx    Social History   Socioeconomic History   Marital status: Single    Spouse name: Not on file   Number of children: 0   Years of education: Not on file   Highest education level: Master's degree (e.g., MA, MS, MEng, MEd, MSW, MBA)  Occupational History   Occupation: retired  Tobacco Use   Smoking status: Never   Smokeless tobacco: Never  Vaping Use   Vaping Use: Never used  Substance and Sexual Activity   Alcohol use: No   Drug  use: No   Sexual activity: Not on file  Other Topics Concern   Not on file  Social History Narrative   Social Review      Patient lives at home alone   Pt lives in a one or two story home?one story home   Private home   What is patient highest level of education?Masters Degree   Is patient RIGHT or LEFT handed? RIGHT HANDED   Does patient drink coffee, tea, soda? YES    How much coffee, tea, soda does patient drink? SODA only at lunch, sometimes 1-2 times a week   Does patient exercise regularly? NO   Social Determinants of Corporate investment banker Strain: Low Risk    Difficulty of Paying Living Expenses: Not hard at all  Food Insecurity: No Food Insecurity   Worried About Programme researcher, broadcasting/film/video in the Last Year: Never true   Ran Out of Food in the Last Year: Never true  Transportation Needs: No Transportation Needs   Lack of Transportation (Medical): No   Lack of Transportation (Non-Medical): No  Physical Activity: Inactive   Days of Exercise per Week: 0 days   Minutes of Exercise per Session: 0 min  Stress: No Stress Concern Present   Feeling of Stress : Only a little  Social Connections: Socially Isolated   Frequency of Communication with Friends and Family: Three times a week   Frequency of Social Gatherings with Friends and Family: Three times a week   Attends Religious Services: Never   Active Member of Clubs or Organizations: No   Attends Banker Meetings: Never   Marital Status: Never married    Tobacco Counseling Counseling given: Not Answered   Clinical Intake:  Pre-visit preparation completed: Yes  Pain : No/denies pain     BMI - recorded: 25.54 Nutritional Status: BMI 25 -29 Overweight Nutritional Risks: None Diabetes: Yes CBG done?: No Did pt. bring in CBG monitor from home?: No  How often do you need to have someone help you when you read instructions, pamphlets, or other written materials from your doctor or pharmacy?: 3 -  Sometimes What is the last grade level you completed in school?: Masters degree  Diabetic?No  Interpreter Needed?: No      Activities of Daily Living In your present state of health, do you have any difficulty performing the following activities: 06/25/2021  Hearing? N  Vision? N  Difficulty concentrating or making decisions? N  Walking or climbing stairs? Y  Dressing or bathing? Y  Doing errands, shopping? Y  Preparing Food and eating ? Y  Using the Toilet? N  In the past six months, have you accidently leaked urine? Y  Do you have problems with loss of bowel control? N  Managing your Finances? Y  Housekeeping or managing your Housekeeping? Jeannie Fend  Some recent data might be hidden    Patient Care Team: Mahlon GammonGupta, Anjali L, MD as PCP - General (Internal Medicine) Drema DallasJaffe, Adam R, DO as Consulting Physician (Neurology)  Indicate any recent Medical Services you may have received from other than [facility] providers in the past year (date may be approximate).     Assessment:   This is a routine wellness examination for Elease Hashimotoatricia.  Hearing/Vision screen No results found.  Dietary issues and exercise activities discussed: Current Exercise Habits: The patient does not participate in regular exercise at present, Exercise limited by: neurologic condition(s);orthopedic condition(s)   Goals Addressed             This Visit's Progress    DIET - INCREASE WATER INTAKE   Not on track      Depression Screen PHQ 2/9 Scores 06/25/2021  PHQ - 2 Score 1    Fall Risk Fall Risk  06/25/2021 03/05/2020 09/06/2019  Falls in the past year? 1 1 1   Number falls in past yr: 0 0 0  Injury with Fall? 0 1 1  Risk for fall due to : History of fall(s);Impaired balance/gait;Impaired mobility - -  Follow up Falls evaluation completed;Education provided;Falls prevention discussed - -    FALL RISK PREVENTION PERTAINING TO THE HOME:  Any stairs in or around the home? No  If so, are there any without  handrails? No  Home free of loose throw rugs in walkways, pet beds, electrical cords, etc? Yes  Adequate lighting in your home to reduce risk of falls? Yes   ASSISTIVE DEVICES UTILIZED TO PREVENT FALLS:  Life alert? No  Use of a cane, walker or w/c? Yes  Grab bars in the bathroom? Yes  Shower chair or bench in shower? Yes  Elevated toilet seat or a handicapped toilet? Yes   TIMED UP AND GO:  Was the test performed? No .  Length of time to ambulate 10 feet: N/A sec.   Gait unsteady without use of assistive device, provider informed and interventions were implemented  Cognitive Function:   Montreal Cognitive Assessment  03/22/2020  Visuospatial/ Executive (0/5) 2  Naming (0/3) 3  Attention: Read list of digits (0/2) 2  Attention: Read list of letters (0/1) 0  Attention: Serial 7 subtraction starting at 100 (0/3) 2  Language: Repeat phrase (0/2) 1  Language : Fluency (0/1) 0  Abstraction (0/2) 2  Delayed Recall (0/5) 2  Orientation (0/6) 6  Total 20   6CIT Screen 06/25/2021  What Year? 4 points  What month? 3 points  What time? 0 points  Count back from 20 2 points  Months in reverse 2 points  Repeat phrase 2 points  Total Score 13    Immunizations Immunization History  Administered Date(s) Administered   Influenza Inj Mdck Quad Pf 08/10/2019   Influenza-Unspecified 08/13/2020   Moderna SARS-COV2 Booster Vaccination 04/16/2021   Moderna Sars-Covid-2 Vaccination 12/11/2019, 01/08/2020   Zoster, Live 07/01/2020    TDAP status: Due, Education has been provided regarding the importance of this vaccine. Advised may receive this vaccine at local pharmacy or Health Dept. Aware to provide a copy of the vaccination record if obtained from local pharmacy or Health Dept. Verbalized acceptance and understanding.  Flu Vaccine status: Due, Education has been provided regarding the importance of this vaccine. Advised may receive this vaccine at local pharmacy or Health Dept. Aware  to provide a copy of the vaccination record if obtained from local pharmacy or Health Dept. Verbalized acceptance and  understanding.  Pneumococcal vaccine status: Up to date  Covid-19 vaccine status: Completed vaccines  Qualifies for Shingles Vaccine? Yes   Zostavax completed Yes   Shingrix Completed?: No.    Education has been provided regarding the importance of this vaccine. Patient has been advised to call insurance company to determine out of pocket expense if they have not yet received this vaccine. Advised may also receive vaccine at local pharmacy or Health Dept. Verbalized acceptance and understanding.  Screening Tests Health Maintenance  Topic Date Due   FOOT EXAM  Never done   OPHTHALMOLOGY EXAM  Never done   URINE MICROALBUMIN  Never done   Hepatitis C Screening  Never done   TETANUS/TDAP  Never done   Zoster Vaccines- Shingrix (1 of 2) Never done   PNA vac Low Risk Adult (1 of 2 - PCV13) Never done   INFLUENZA VACCINE  06/09/2021   HEMOGLOBIN A1C  08/16/2021   COVID-19 Vaccine (4 - Booster for Moderna series) 08/16/2021   DEXA SCAN  Completed   HPV VACCINES  Aged Out    Health Maintenance  Health Maintenance Due  Topic Date Due   FOOT EXAM  Never done   OPHTHALMOLOGY EXAM  Never done   URINE MICROALBUMIN  Never done   Hepatitis C Screening  Never done   TETANUS/TDAP  Never done   Zoster Vaccines- Shingrix (1 of 2) Never done   PNA vac Low Risk Adult (1 of 2 - PCV13) Never done   INFLUENZA VACCINE  06/09/2021    Colorectal cancer screening: No longer required.   Mammogram status: No longer required due to advanced age- refused .  Bone Density status: Completed 2001. Results reflect: Bone density results: OSTEOPENIA. Repeat every 2 years.  Lung Cancer Screening: (Low Dose CT Chest recommended if Age 47-80 years, 30 pack-year currently smoking OR have quit w/in 15years.) does not qualify.   Lung Cancer Screening Referral: No  Additional  Screening:  Hepatitis C Screening: does qualify; Completed No  Vision Screening: Recommended annual ophthalmology exams for early detection of glaucoma and other disorders of the eye. Is the patient up to date with their annual eye exam?  Yes  Who is the provider or what is the name of the office in which the patient attends annual eye exams? N/A If pt is not established with a provider, would they like to be referred to a provider to establish care? No .   Dental Screening: Recommended annual dental exams for proper oral hygiene  Community Resource Referral / Chronic Care Management: CRR required this visit?  No   CCM required this visit?  No      Plan:     I have personally reviewed and noted the following in the patient's chart:   Medical and social history Use of alcohol, tobacco or illicit drugs  Current medications and supplements including opioid prescriptions.  Functional ability and status Nutritional status Physical activity Advanced directives List of other physicians Hospitalizations, surgeries, and ER visits in previous 12 months Vitals Screenings to include cognitive, depression, and falls Referrals and appointments  In addition, I have reviewed and discussed with patient certain preventive protocols, quality metrics, and best practice recommendations. A written personalized care plan for preventive services as well as general preventive health recommendations were provided to patient.     Octavia Heir, NP   06/25/2021

## 2021-06-25 NOTE — Progress Notes (Deleted)
.  psc 

## 2021-06-25 NOTE — Patient Instructions (Addendum)
  Ms. Whitcher , Thank you for taking time to come for your Medicare Wellness Visit. I appreciate your ongoing commitment to your health goals. Please review the following plan we discussed and let me know if I can assist you in the future.   These are the goals we discussed:  Goals      DIET - INCREASE WATER INTAKE        This is a list of the screening recommended for you and due dates:  Health Maintenance  Topic Date Due   Complete foot exam   Never done   Eye exam for diabetics  Never done   Urine Protein Check  Never done   Hepatitis C Screening: USPSTF Recommendation to screen - Ages 37-79 yo.  Never done   Tetanus Vaccine  Never done   Zoster (Shingles) Vaccine (1 of 2) Never done   Pneumonia vaccines (1 of 2 - PCV13) Never done   Flu Shot  06/09/2021   Hemoglobin A1C  08/16/2021   COVID-19 Vaccine (4 - Booster for Moderna series) 08/16/2021   DEXA scan (bone density measurement)  Completed   HPV Vaccine  Aged Out   Pneumonia vaccines up to date.

## 2021-06-26 DIAGNOSIS — R279 Unspecified lack of coordination: Secondary | ICD-10-CM | POA: Diagnosis not present

## 2021-06-26 DIAGNOSIS — R2681 Unsteadiness on feet: Secondary | ICD-10-CM | POA: Diagnosis not present

## 2021-06-26 DIAGNOSIS — M6281 Muscle weakness (generalized): Secondary | ICD-10-CM | POA: Diagnosis not present

## 2021-06-26 DIAGNOSIS — Z9181 History of falling: Secondary | ICD-10-CM | POA: Diagnosis not present

## 2021-06-27 DIAGNOSIS — Z9181 History of falling: Secondary | ICD-10-CM | POA: Diagnosis not present

## 2021-06-27 DIAGNOSIS — R2681 Unsteadiness on feet: Secondary | ICD-10-CM | POA: Diagnosis not present

## 2021-06-27 DIAGNOSIS — R279 Unspecified lack of coordination: Secondary | ICD-10-CM | POA: Diagnosis not present

## 2021-06-27 DIAGNOSIS — M6281 Muscle weakness (generalized): Secondary | ICD-10-CM | POA: Diagnosis not present

## 2021-06-30 DIAGNOSIS — Z9181 History of falling: Secondary | ICD-10-CM | POA: Diagnosis not present

## 2021-06-30 DIAGNOSIS — R2681 Unsteadiness on feet: Secondary | ICD-10-CM | POA: Diagnosis not present

## 2021-06-30 DIAGNOSIS — R279 Unspecified lack of coordination: Secondary | ICD-10-CM | POA: Diagnosis not present

## 2021-06-30 DIAGNOSIS — M6281 Muscle weakness (generalized): Secondary | ICD-10-CM | POA: Diagnosis not present

## 2021-07-01 DIAGNOSIS — Z9181 History of falling: Secondary | ICD-10-CM | POA: Diagnosis not present

## 2021-07-01 DIAGNOSIS — M6281 Muscle weakness (generalized): Secondary | ICD-10-CM | POA: Diagnosis not present

## 2021-07-01 DIAGNOSIS — R279 Unspecified lack of coordination: Secondary | ICD-10-CM | POA: Diagnosis not present

## 2021-07-01 DIAGNOSIS — R2681 Unsteadiness on feet: Secondary | ICD-10-CM | POA: Diagnosis not present

## 2021-07-02 DIAGNOSIS — M6281 Muscle weakness (generalized): Secondary | ICD-10-CM | POA: Diagnosis not present

## 2021-07-02 DIAGNOSIS — R279 Unspecified lack of coordination: Secondary | ICD-10-CM | POA: Diagnosis not present

## 2021-07-02 DIAGNOSIS — R2681 Unsteadiness on feet: Secondary | ICD-10-CM | POA: Diagnosis not present

## 2021-07-02 DIAGNOSIS — Z9181 History of falling: Secondary | ICD-10-CM | POA: Diagnosis not present

## 2021-07-03 DIAGNOSIS — R279 Unspecified lack of coordination: Secondary | ICD-10-CM | POA: Diagnosis not present

## 2021-07-03 DIAGNOSIS — Z9181 History of falling: Secondary | ICD-10-CM | POA: Diagnosis not present

## 2021-07-03 DIAGNOSIS — R2681 Unsteadiness on feet: Secondary | ICD-10-CM | POA: Diagnosis not present

## 2021-07-03 DIAGNOSIS — M6281 Muscle weakness (generalized): Secondary | ICD-10-CM | POA: Diagnosis not present

## 2021-07-04 DIAGNOSIS — M6281 Muscle weakness (generalized): Secondary | ICD-10-CM | POA: Diagnosis not present

## 2021-07-04 DIAGNOSIS — Z9181 History of falling: Secondary | ICD-10-CM | POA: Diagnosis not present

## 2021-07-04 DIAGNOSIS — R279 Unspecified lack of coordination: Secondary | ICD-10-CM | POA: Diagnosis not present

## 2021-07-04 DIAGNOSIS — R2681 Unsteadiness on feet: Secondary | ICD-10-CM | POA: Diagnosis not present

## 2021-07-07 DIAGNOSIS — Z9181 History of falling: Secondary | ICD-10-CM | POA: Diagnosis not present

## 2021-07-07 DIAGNOSIS — M6281 Muscle weakness (generalized): Secondary | ICD-10-CM | POA: Diagnosis not present

## 2021-07-07 DIAGNOSIS — R2681 Unsteadiness on feet: Secondary | ICD-10-CM | POA: Diagnosis not present

## 2021-07-07 DIAGNOSIS — R279 Unspecified lack of coordination: Secondary | ICD-10-CM | POA: Diagnosis not present

## 2021-07-08 DIAGNOSIS — R2681 Unsteadiness on feet: Secondary | ICD-10-CM | POA: Diagnosis not present

## 2021-07-08 DIAGNOSIS — Z9181 History of falling: Secondary | ICD-10-CM | POA: Diagnosis not present

## 2021-07-08 DIAGNOSIS — R279 Unspecified lack of coordination: Secondary | ICD-10-CM | POA: Diagnosis not present

## 2021-07-08 DIAGNOSIS — M6281 Muscle weakness (generalized): Secondary | ICD-10-CM | POA: Diagnosis not present

## 2021-07-09 DIAGNOSIS — M6281 Muscle weakness (generalized): Secondary | ICD-10-CM | POA: Diagnosis not present

## 2021-07-09 DIAGNOSIS — Z9181 History of falling: Secondary | ICD-10-CM | POA: Diagnosis not present

## 2021-07-09 DIAGNOSIS — R2681 Unsteadiness on feet: Secondary | ICD-10-CM | POA: Diagnosis not present

## 2021-07-09 DIAGNOSIS — R279 Unspecified lack of coordination: Secondary | ICD-10-CM | POA: Diagnosis not present

## 2021-07-11 DIAGNOSIS — Z9181 History of falling: Secondary | ICD-10-CM | POA: Diagnosis not present

## 2021-07-11 DIAGNOSIS — R2681 Unsteadiness on feet: Secondary | ICD-10-CM | POA: Diagnosis not present

## 2021-07-11 DIAGNOSIS — R279 Unspecified lack of coordination: Secondary | ICD-10-CM | POA: Diagnosis not present

## 2021-07-11 DIAGNOSIS — M6281 Muscle weakness (generalized): Secondary | ICD-10-CM | POA: Diagnosis not present

## 2021-07-12 DIAGNOSIS — R279 Unspecified lack of coordination: Secondary | ICD-10-CM | POA: Diagnosis not present

## 2021-07-12 DIAGNOSIS — R2681 Unsteadiness on feet: Secondary | ICD-10-CM | POA: Diagnosis not present

## 2021-07-12 DIAGNOSIS — M6281 Muscle weakness (generalized): Secondary | ICD-10-CM | POA: Diagnosis not present

## 2021-07-12 DIAGNOSIS — Z9181 History of falling: Secondary | ICD-10-CM | POA: Diagnosis not present

## 2021-07-15 DIAGNOSIS — R279 Unspecified lack of coordination: Secondary | ICD-10-CM | POA: Diagnosis not present

## 2021-07-15 DIAGNOSIS — R2681 Unsteadiness on feet: Secondary | ICD-10-CM | POA: Diagnosis not present

## 2021-07-15 DIAGNOSIS — Z9181 History of falling: Secondary | ICD-10-CM | POA: Diagnosis not present

## 2021-07-15 DIAGNOSIS — M6281 Muscle weakness (generalized): Secondary | ICD-10-CM | POA: Diagnosis not present

## 2021-07-16 DIAGNOSIS — Z9181 History of falling: Secondary | ICD-10-CM | POA: Diagnosis not present

## 2021-07-16 DIAGNOSIS — M6281 Muscle weakness (generalized): Secondary | ICD-10-CM | POA: Diagnosis not present

## 2021-07-16 DIAGNOSIS — R2681 Unsteadiness on feet: Secondary | ICD-10-CM | POA: Diagnosis not present

## 2021-07-16 DIAGNOSIS — R279 Unspecified lack of coordination: Secondary | ICD-10-CM | POA: Diagnosis not present

## 2021-07-17 DIAGNOSIS — R279 Unspecified lack of coordination: Secondary | ICD-10-CM | POA: Diagnosis not present

## 2021-07-17 DIAGNOSIS — R2681 Unsteadiness on feet: Secondary | ICD-10-CM | POA: Diagnosis not present

## 2021-07-17 DIAGNOSIS — Z9181 History of falling: Secondary | ICD-10-CM | POA: Diagnosis not present

## 2021-07-17 DIAGNOSIS — M6281 Muscle weakness (generalized): Secondary | ICD-10-CM | POA: Diagnosis not present

## 2021-07-18 DIAGNOSIS — M79672 Pain in left foot: Secondary | ICD-10-CM | POA: Diagnosis not present

## 2021-07-18 DIAGNOSIS — L602 Onychogryphosis: Secondary | ICD-10-CM | POA: Diagnosis not present

## 2021-07-18 DIAGNOSIS — M79671 Pain in right foot: Secondary | ICD-10-CM | POA: Diagnosis not present

## 2021-08-20 ENCOUNTER — Encounter: Payer: Self-pay | Admitting: Orthopedic Surgery

## 2021-08-20 ENCOUNTER — Non-Acute Institutional Stay: Payer: Medicare Other | Admitting: Orthopedic Surgery

## 2021-08-20 DIAGNOSIS — R419 Unspecified symptoms and signs involving cognitive functions and awareness: Secondary | ICD-10-CM

## 2021-08-20 DIAGNOSIS — M25511 Pain in right shoulder: Secondary | ICD-10-CM

## 2021-08-20 DIAGNOSIS — K5901 Slow transit constipation: Secondary | ICD-10-CM | POA: Diagnosis not present

## 2021-08-20 DIAGNOSIS — E1149 Type 2 diabetes mellitus with other diabetic neurological complication: Secondary | ICD-10-CM

## 2021-08-20 DIAGNOSIS — L853 Xerosis cutis: Secondary | ICD-10-CM

## 2021-08-20 DIAGNOSIS — G8929 Other chronic pain: Secondary | ICD-10-CM

## 2021-08-20 DIAGNOSIS — I10 Essential (primary) hypertension: Secondary | ICD-10-CM

## 2021-08-20 DIAGNOSIS — R32 Unspecified urinary incontinence: Secondary | ICD-10-CM

## 2021-08-20 DIAGNOSIS — R911 Solitary pulmonary nodule: Secondary | ICD-10-CM

## 2021-08-20 DIAGNOSIS — Z8673 Personal history of transient ischemic attack (TIA), and cerebral infarction without residual deficits: Secondary | ICD-10-CM

## 2021-08-20 DIAGNOSIS — M810 Age-related osteoporosis without current pathological fracture: Secondary | ICD-10-CM

## 2021-08-20 NOTE — Progress Notes (Signed)
Location:   [facility] Room Number: 34 Place of Service:  SNF ([phone]) Provider:  Hazle Nordmann, NP  Mahlon Gammon, MD  Patient Care Team: Mahlon Gammon, MD as PCP - General (Internal Medicine) [name], DO as Consulting Physician (Neurology)  Extended Emergency Contact Information Primary Emergency Contact: [name],[name] Address: [address]          [place] [address] [place] Home Phone: [phone] Relation: Sister Secondary Emergency Contact: [facility] Address: [address] [place], [place] [address] [name] Mobile Phone: [phone] Relation: Brother  Code Status:  DNR Goals of care: Advanced Directive information Advanced Directives 08/20/2021  Does Patient Have a Medical Advance Directive? Yes  Type of Estate agent of [place];Living will  Does patient want to make changes to medical advance directive? No - Patient declined  Copy of Healthcare Power of Attorney in Chart? Yes - validated most recent copy scanned in chart (See row information)  Would patient like information on creating a medical advance directive? -  Pre-existing out of facility DNR order (yellow form or pink MOST form) -     Chief Complaint  Patient presents with   Medical Management of Chronic Issues    Routine follow up visit.   Health Maintenance    Foot exam,eye exam, urined microalbumin, Hep C screening, tetanus/tdap, zoster, flu, hemoglobin A1C, COVID vaccine    HPI:  Pt is a 80 y.o. female seen today for medical management of chronic diseases.    She currently resides on the assisted living unit at [facility]. Past medical history includes: HTN, T2DM, constipation, neurocognitive disorder, osteoporosis, dyslipidemia, hx of lung nodule, lung nodule, and abnormal gait.   T2DM- A1c 7.9 06/19/2021, no hypoglycemic events, remains on metformin HTN- BUN/creat 16/0.88 06/19/2021, ramipril  discontinued 06/2020 due to orthostatic syncope, controlled without medication Neurocognitive disorder- MRI brain 2020 revealed advanced atrophy and small vessel disease, doing well in AL, very pleasant, answer appropriately today, she has been found in the hallway without pants a few times Constipation- LBM 10/11, remains on senna daily Urinary incontinence- remains on Ditropan, she is having more incontinent issues requiring assistance from staff Osteoporosis- needs DEXA scan- delayed by family, remains on Caltrate, fosamax discontinued in 2020 Hx TIA- no recent episodes, remains on statin Right shoulder pain- CT right shoulder confirmed healed humeral neck fracture, reports intermittent pain, remains on tylenol daily Lung nodule- CT right shoulder 01/2021 noted a 5 mm nodular lesion in right lung, f/u chest CT recommended, family would like f/u CXR in a few months Thyroid nodule-  Dry skin- reports dry skin on upper arms, does not use moisturizer daily  09/28 she was found on the floor by staff, no injury. Ambulates in wheelchair.   Recent blood pressures:  10/05- 123/70  09/28- 125/69, 128/72  09/21- 113/78  Recent weights:  10/10- 149.4 lbs  09/01- 149.8 lbs  08/01- 148.8 lbs  Past Medical History:  Diagnosis Date   Diabetes mellitus    Dyslipidemia    Hypertension    Osteoporosis    Urinary incontinence    History reviewed. No pertinent surgical history.  Allergies  Allergen Reactions   Amoxicillin-Pot Clavulanate Hives   Meloxicam     Unknown reaction   Scallops [Shellfish Allergy] Other (See Comments)    "I don't feel so good after an hour"   Toviaz [Fesoterodine Fumarate Er]  Unknown reaction    Allergies as of 08/20/2021       Reactions   Amoxicillin-pot Clavulanate Hives   Meloxicam    Unknown reaction   Scallops [shellfish Allergy] Other (See Comments)   "I don't feel so good after an hour"   Toviaz [fesoterodine Fumarate Er]    Unknown reaction         Medication List        Accurate as of August 20, 2021 10:10 AM. If you have any questions, ask your nurse or doctor.          STOP taking these medications    alendronate 70 MG tablet Commonly known as: FOSAMAX Stopped by: Octavia Heir, NP       TAKE these medications    acetaminophen 325 MG tablet Commonly known as: TYLENOL Take 650 mg by mouth 2 (two) times daily.   atorvastatin 40 MG tablet Commonly known as: LIPITOR Take 1 tablet (40 mg total) by mouth daily.   CALTRATE 600+D PO Take 1 tablet by mouth 2 (two) times daily.   CENTRUM SILVER PO Take 1 tablet by mouth daily.   clopidogrel 75 MG tablet Commonly known as: PLAVIX Take 75 mg by mouth daily.   metFORMIN 500 MG tablet Commonly known as: GLUCOPHAGE Take 500 mg by mouth 2 (two) times daily with a meal.   oxybutynin 5 MG 24 hr tablet Commonly known as: DITROPAN-XL Take 5 mg by mouth at bedtime.   sennosides-docusate sodium 8.6-50 MG tablet Commonly known as: SENOKOT-S Take 1 tablet by mouth in the morning and at bedtime.   VITAMIN D PO Take 1 tablet by mouth daily. 1,000 units        Review of Systems  Constitutional:  Negative for activity change, appetite change, chills, fatigue and fever.  HENT:  Negative for dental problem, hearing loss and trouble swallowing.   Eyes:  Negative for visual disturbance.       Glasses  Respiratory:  Negative for cough, shortness of breath and wheezing.   Cardiovascular:  Negative for chest pain and leg swelling.  Gastrointestinal:  Positive for constipation. Negative for abdominal distention, abdominal pain, blood in stool, diarrhea, nausea and vomiting.  Genitourinary:  Negative for dysuria, frequency and hematuria.       Urinary incontinence  Musculoskeletal:  Positive for arthralgias and gait problem.       Right shoulder pain  Skin:        Dry skin  Neurological:  Positive for weakness. Negative for dizziness and headaches.       Ataxia   Psychiatric/Behavioral:  Negative for dysphoric mood and sleep disturbance. The patient is not nervous/anxious.    Immunization History  Administered Date(s) Administered   Influenza Inj Mdck Quad Pf 08/10/2019   Influenza-Unspecified 08/13/2020   Moderna SARS-COV2 Booster Vaccination 04/16/2021   Moderna Sars-Covid-2 Vaccination 12/11/2019, 01/08/2020   Zoster, Live 07/01/2020   Pertinent  Health Maintenance Due  Topic Date Due   FOOT EXAM  Never done   OPHTHALMOLOGY EXAM  Never done   URINE MICROALBUMIN  Never done   INFLUENZA VACCINE  06/09/2021   HEMOGLOBIN A1C  08/16/2021   DEXA SCAN  Completed   Fall Risk  06/25/2021 03/05/2020 09/06/2019  Falls in the past year? 1 1 1   Number falls in past yr: 0 0 0  Injury with Fall? 0 1 1  Risk for fall due to : History of fall(s);Impaired balance/gait;Impaired mobility - -  Follow up Falls  evaluation completed;Education provided;Falls prevention discussed - -   Functional Status Survey:    Vitals:   08/20/21 0943  BP: 123/70  Pulse: 83  Resp: 14  Temp: (!) 96.5 F (35.8 C)  SpO2: 98%  Weight: 149 lb 6.4 oz (67.8 kg)  Height: 5\' 4"  (1.626 m)   Body mass index is 25.64 kg/m. Physical Exam Vitals reviewed.  Constitutional:      General: She is not in acute distress. HENT:     Head: Normocephalic.     Right Ear: There is no impacted cerumen.     Left Ear: There is no impacted cerumen.     Nose: Nose normal.     Mouth/Throat:     Mouth: Mucous membranes are moist.  Eyes:     General:        Right eye: No discharge.        Left eye: No discharge.  Neck:     Vascular: No carotid bruit.  Cardiovascular:     Rate and Rhythm: Normal rate and regular rhythm.     Pulses: Normal pulses.     Heart sounds: Normal heart sounds. No murmur heard. Pulmonary:     Effort: Pulmonary effort is normal. No respiratory distress.     Breath sounds: Normal breath sounds. No wheezing.  Abdominal:     General: Bowel sounds are normal.  There is no distension.     Palpations: Abdomen is soft.     Tenderness: There is no abdominal tenderness.  Musculoskeletal:     Cervical back: Normal range of motion.     Right lower leg: No edema.     Left lower leg: No edema.  Feet:     Right foot:     Protective Sensation: 10 sites tested.  10 sites sensed.     Skin integrity: Skin integrity normal. No skin breakdown.     Toenail Condition: Right toenails are normal.     Left foot:     Protective Sensation: 10 sites tested.  10 sites sensed.     Skin integrity: Skin integrity normal. No skin breakdown.     Toenail Condition: Left toenails are normal.  Lymphadenopathy:     Cervical: No cervical adenopathy.  Skin:    General: Skin is warm and dry.     Capillary Refill: Capillary refill takes less than 2 seconds.     Comments: Upper arms with flaking skin, some scratch marks.   Neurological:     General: No focal deficit present.     Mental Status: She is alert. Mental status is at baseline.     Motor: Weakness present.     Gait: Gait abnormal.     Comments: wheelchair  Psychiatric:        Mood and Affect: Mood normal.        Behavior: Behavior normal.    Labs reviewed: Recent Labs    02/14/21 0000  NA 138  K 4.1  CL 105  CO2 24*  BUN 21  CREATININE 0.8  CALCIUM 9.0   Recent Labs    02/14/21 0000  ALBUMIN 3.7   Recent Labs    02/14/21 0000  WBC 6.2  NEUTROABS 3,168.00  HGB 12.1  HCT 37  PLT 248   Lab Results  Component Value Date   TSH 0.96 07/11/2020   Lab Results  Component Value Date   HGBA1C 6.7 02/14/2021   Lab Results  Component Value Date   CHOL 156 02/14/2021  HDL 51 02/14/2021   LDLCALC 83 02/14/2021   TRIG 121 02/14/2021   CHOLHDL 4.5 08/20/2019    Significant Diagnostic Results in last 30 days:  No results found.  Assessment/Plan 1. Type 2 diabetes mellitus with other neurologic complication, without long-term current use of insulin (HCC) - a1c 7.9 06/19/2021 - cont  metformin  2. Hypertension, unspecified type - controlled without medication - ramipril discontinued 06/2020 due to orthostatic hypotension  3. Neurocognitive disorder - some odd behaviors in the past month - requiring more assistance with ADLs from staff  4. Slow transit constipation - LBM 10/11 - abdomen soft - cont senna  5. Urinary incontinence, unspecified type - more incontinent episodes  - cont Ditropan  6. Age related osteoporosis, unspecified pathological fracture presence - DEXA needed- family chose to delay scan - cont Caltrate  7. History of TIA (transient ischemic attack) - cont statin  8. Chronic right shoulder pain - intermittent pain - cont scheduled tylenol   9. Lung nodule - 5 mm nodule found in right lung - f/u CT chest recommended - family would like CXR in few months  10. Dry skin - arms with dry, flaking skin - advised to apply lotion to arms daily and after showering   Family/ staff Communication: plan discussed with patient and nurse  Labs/tests ordered:  DEXA and CXR- future

## 2021-08-23 IMAGING — MG DIGITAL SCREENING BILAT W/ TOMO W/ CAD
8 series · 8 of 24 positions shown · non-contrast
Comparison: Previous exam(s).

CLINICAL DATA: Screening.

EXAM:
DIGITAL SCREENING BILATERAL MAMMOGRAM WITH TOMO AND CAD

[R CC synth-2D]
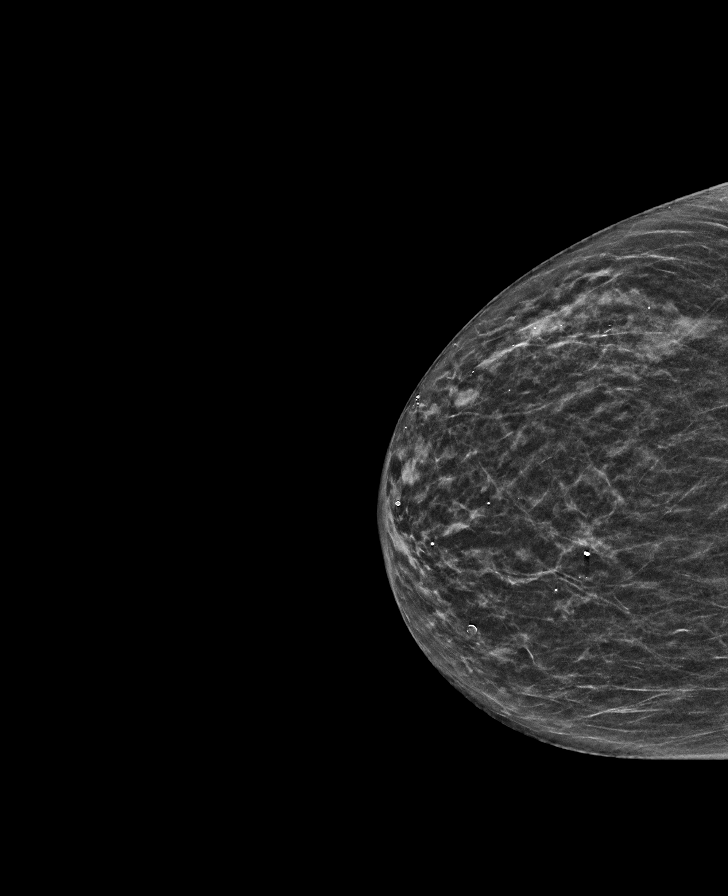

[L CC synth-2D]
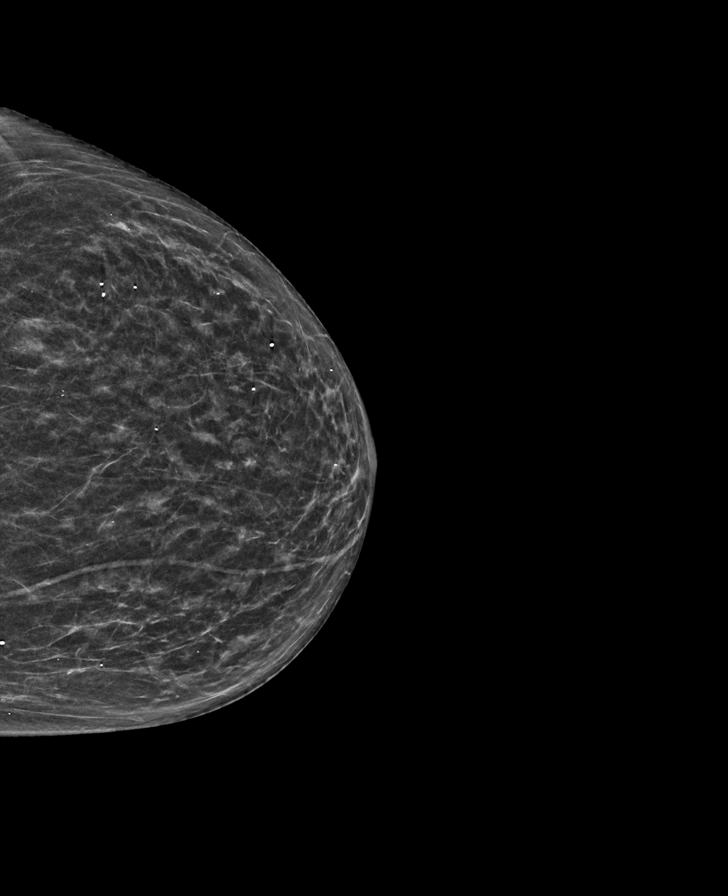

[R MLO synth-2D]
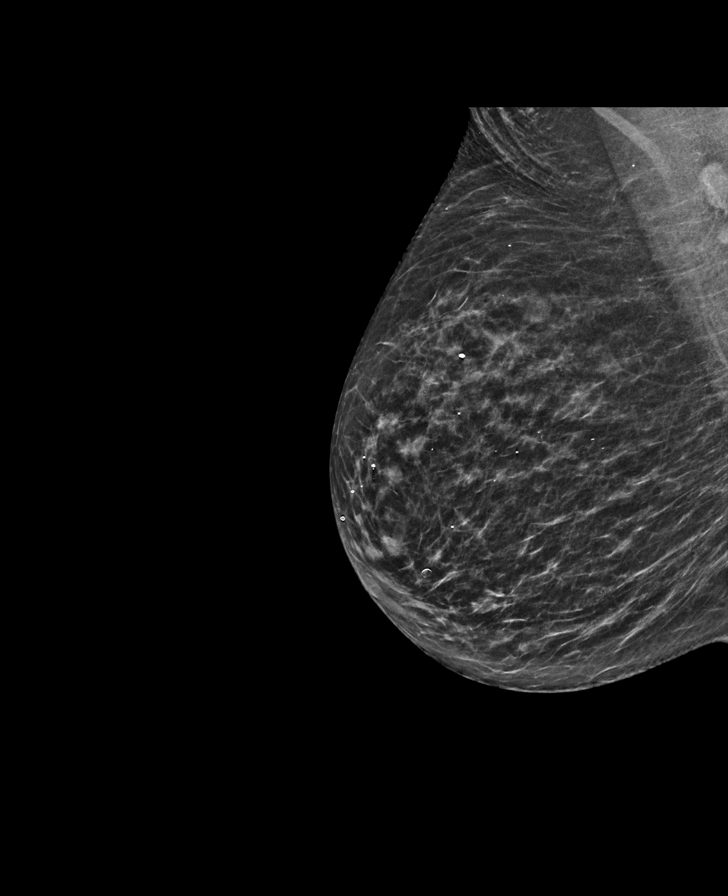

[L MLO synth-2D]
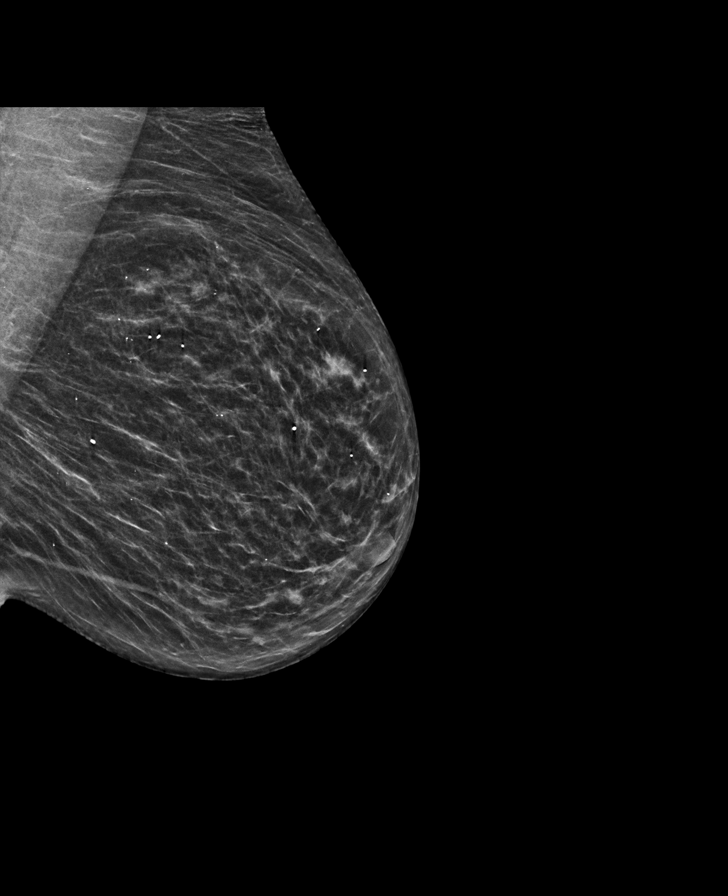

[L CC tomo · tomo slice 23/46.0]
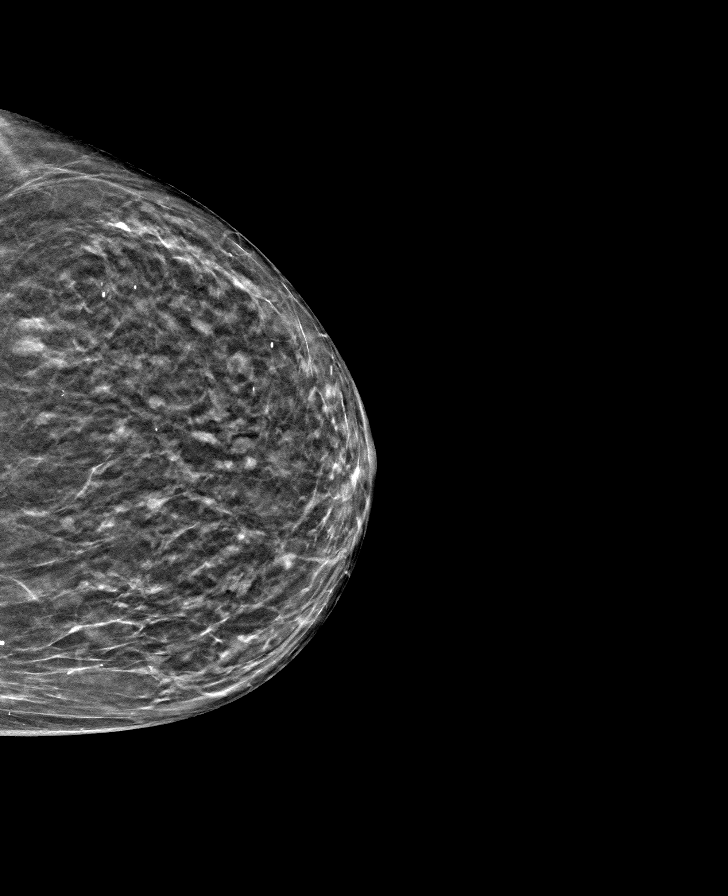

[R MLO tomo · tomo slice 26/51.0]
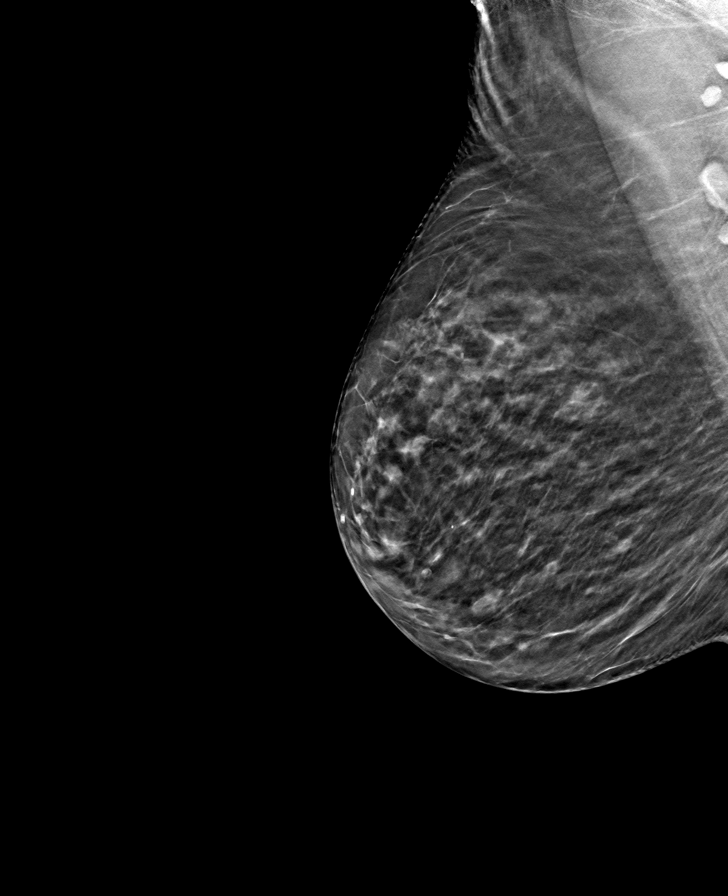

[R CC tomo · tomo slice 24/47.0]
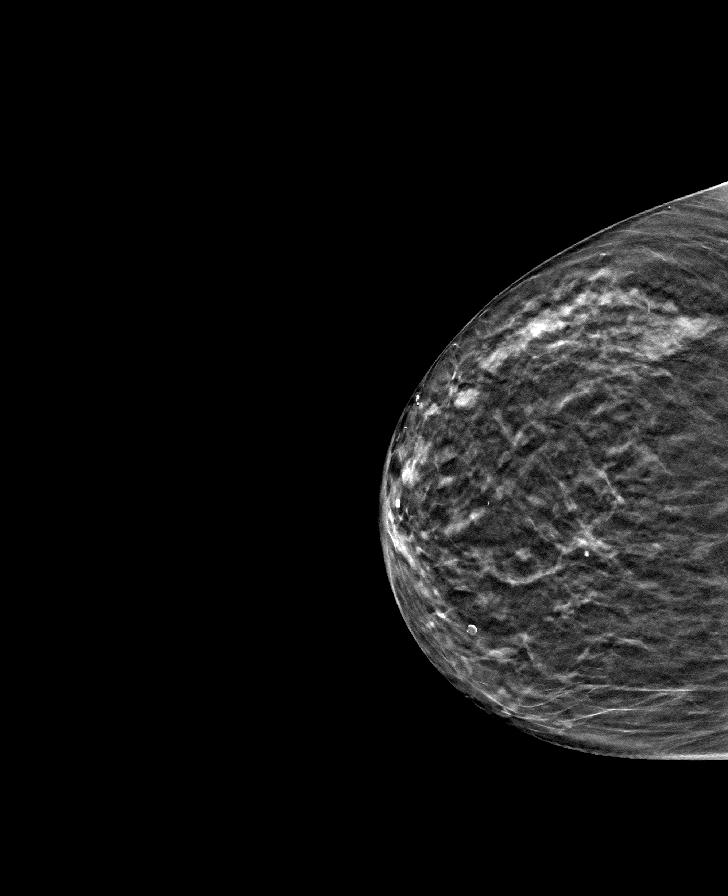

[L MLO tomo · tomo slice 27/54.0]
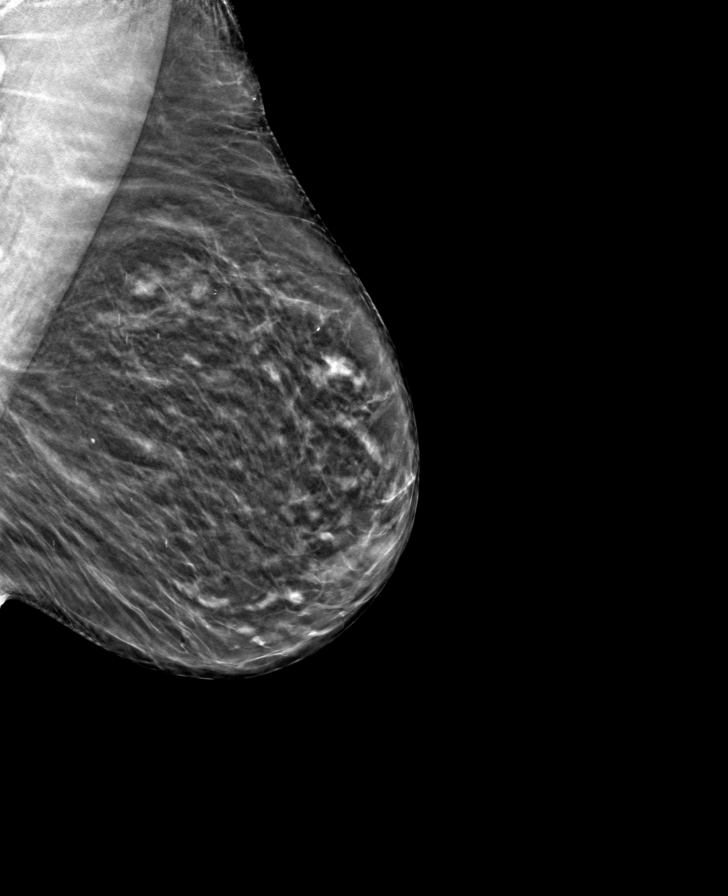

[8 of 24 positions shown; findings below may reference images not displayed]

ACR Breast Density Category b: There are scattered areas of
fibroglandular density.
FINDINGS: There are no findings suspicious for malignancy. Images were
processed with CAD.
IMPRESSION: No mammographic evidence of malignancy. A result letter of this
screening mammogram will be mailed directly to the patient.

RECOMMENDATION:
Screening mammogram in one year. (Code:[id number])

BI-RADS CATEGORY  1: Negative.

## 2021-09-11 ENCOUNTER — Encounter: Payer: Self-pay | Admitting: Internal Medicine

## 2021-09-11 ENCOUNTER — Non-Acute Institutional Stay (SKILLED_NURSING_FACILITY): Payer: Medicare Other | Admitting: Internal Medicine

## 2021-09-11 DIAGNOSIS — R911 Solitary pulmonary nodule: Secondary | ICD-10-CM | POA: Diagnosis not present

## 2021-09-11 DIAGNOSIS — M810 Age-related osteoporosis without current pathological fracture: Secondary | ICD-10-CM

## 2021-09-11 DIAGNOSIS — E1149 Type 2 diabetes mellitus with other diabetic neurological complication: Secondary | ICD-10-CM | POA: Diagnosis not present

## 2021-09-11 DIAGNOSIS — Z9181 History of falling: Secondary | ICD-10-CM | POA: Diagnosis not present

## 2021-09-11 DIAGNOSIS — Z8673 Personal history of transient ischemic attack (TIA), and cerebral infarction without residual deficits: Secondary | ICD-10-CM | POA: Diagnosis not present

## 2021-09-11 DIAGNOSIS — R41841 Cognitive communication deficit: Secondary | ICD-10-CM | POA: Diagnosis not present

## 2021-09-11 DIAGNOSIS — M6281 Muscle weakness (generalized): Secondary | ICD-10-CM | POA: Diagnosis not present

## 2021-09-11 DIAGNOSIS — E785 Hyperlipidemia, unspecified: Secondary | ICD-10-CM

## 2021-09-11 DIAGNOSIS — R419 Unspecified symptoms and signs involving cognitive functions and awareness: Secondary | ICD-10-CM

## 2021-09-11 DIAGNOSIS — R32 Unspecified urinary incontinence: Secondary | ICD-10-CM

## 2021-09-11 DIAGNOSIS — R270 Ataxia, unspecified: Secondary | ICD-10-CM

## 2021-09-11 DIAGNOSIS — R2681 Unsteadiness on feet: Secondary | ICD-10-CM | POA: Diagnosis not present

## 2021-09-11 DIAGNOSIS — R296 Repeated falls: Secondary | ICD-10-CM | POA: Diagnosis not present

## 2021-09-11 NOTE — Progress Notes (Signed)
Provider:  Einar Crow MD Location:   [facility] Room Number: 4 Place of Service:  SNF (31)  PCP: Mahlon Gammon, MD Patient Care Team: Mahlon Gammon, MD as PCP - General (Internal Medicine) Jaclyn Dallas, DO as Consulting Physician (Neurology)  Extended Emergency Contact Information Primary Emergency Contact: [name],[name] Address: [address]          [place] [address] [place] Home Phone: [phone] Relation: Sister Secondary Emergency Contact: [facility] Address: [address] [place], [place] [address] [name] Mobile Phone: [phone] Relation: Brother  Code Status: DNR Managed Care Goals of Care: Advanced Directive information Advanced Directives 09/11/2021  Does Patient Have a Medical Advance Directive? Yes  Type of Estate agent of [place];Living will  Does patient want to make changes to medical advance directive? No - Patient declined  Copy of Healthcare Power of Attorney in Chart? Yes - validated most recent copy scanned in chart (See row information)  Would patient like information on creating a medical advance directive? -  Pre-existing out of facility DNR order (yellow form or pink MOST form) -      Chief Complaint  Patient presents with   Readmit To SNF    Readmission to SNF    HPI: Patient is a 80 y.o. 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 Has to be moved to SNF for needing High level of care  Patient continues to have decline in her Cognition. Unable to do her transfers and dress herself. She did not have any acute issues today. Was upset about moving in new Room Her sister had noticed some issue with her Left EYE. Patient denies any vision issues. No Pain or redness   Past  Medical History:  Diagnosis Date   Diabetes mellitus    Dyslipidemia    Hypertension    Osteoporosis    Urinary incontinence    History reviewed. No pertinent surgical history.  reports that she has never smoked. She has never used smokeless tobacco. She reports that she does not drink alcohol and does not use drugs. Social History   Socioeconomic History   Marital status: Single    Spouse name: Not on file   Number of children: 0   Years of education: Not on file   Highest education level: Master's degree (e.g., MA, MS, MEng, MEd, MSW, MBA)  Occupational History   Occupation: retired  Tobacco Use   Smoking status: Never   Smokeless tobacco: Never  Vaping Use   Vaping Use: Never used  Substance and Sexual Activity   Alcohol use: No   Drug use: No   Sexual activity: Not on file  Other Topics Concern   Not on file  Social History Narrative   Social Review      Patient lives at home alone   Pt lives in a one or two story home?one story home   Private home   What is patient highest level of education?Masters Degree   Is patient RIGHT or LEFT handed? RIGHT HANDED   Does patient drink coffee, tea, soda? YES    How much coffee, tea, soda does patient drink? SODA only at lunch, sometimes 1-2 times a week   Does patient exercise regularly?  NO   Social Determinants of Corporate investment banker Strain: Low Risk    Difficulty of Paying Living Expenses: Not hard at all  Food Insecurity: No Food Insecurity   Worried About Programme researcher, broadcasting/film/video in the Last Year: Never true   Ran Out of Food in the Last Year: Never true  Transportation Needs: No Transportation Needs   Lack of Transportation (Medical): No   Lack of Transportation (Non-Medical): No  Physical Activity: Inactive   Days of Exercise per Week: 0 days   Minutes of Exercise per Session: 0 min  Stress: No Stress Concern Present   Feeling of Stress : Only a little  Social Connections: Socially Isolated   Frequency of  Communication with Friends and Family: Three times a week   Frequency of Social Gatherings with Friends and Family: Three times a week   Attends Religious Services: Never   Active Member of Clubs or Organizations: No   Attends Engineer, structural: Never   Marital Status: Never married  Catering manager Violence: Not At Risk   Fear of Current or Ex-Partner: No   Emotionally Abused: No   Physically Abused: No   Sexually Abused: No    Functional Status Survey:    Family History  Problem Relation Age of Onset   Fragile X syndrome Father    Healthy Sister    Breast cancer Neg Hx    Stroke Neg Hx     Health Maintenance  Topic Date Due   Pneumonia Vaccine 46+ Years old (1 - PCV) Never done   OPHTHALMOLOGY EXAM  Never done   URINE MICROALBUMIN  Never done   Hepatitis C Screening  Never done   TETANUS/TDAP  Never done   Zoster Vaccines- Shingrix (1 of 2) Never done   INFLUENZA VACCINE  06/09/2021   COVID-19 Vaccine (3 - Booster for Moderna series) 06/11/2021   HEMOGLOBIN A1C  08/16/2021   FOOT EXAM  08/20/2022   DEXA SCAN  Completed   HPV VACCINES  Aged Out    Allergies  Allergen Reactions   Amoxicillin-Pot Clavulanate Hives   Meloxicam     Unknown reaction   Scallops [Shellfish Allergy] Other (See Comments)    "I don't feel so good after an hour"   Toviaz [Fesoterodine Fumarate Er]     Unknown reaction    Allergies as of 09/11/2021       Reactions   Amoxicillin-pot Clavulanate Hives   Meloxicam    Unknown reaction   Scallops [shellfish Allergy] Other (See Comments)   "I don't feel so good after an hour"   Toviaz [fesoterodine Fumarate Er]    Unknown reaction        Medication List        Accurate as of September 11, 2021 11:04 AM. If you have any questions, ask your nurse or doctor.          acetaminophen 325 MG tablet Commonly known as: TYLENOL Take 650 mg by mouth 2 (two) times daily.   atorvastatin 40 MG tablet Commonly known as:  LIPITOR Take 1 tablet (40 mg total) by mouth daily.   CALTRATE 600+D PO Take 1 tablet by mouth 2 (two) times daily.   CENTRUM SILVER PO Take 1 tablet by mouth daily.   clopidogrel 75 MG tablet Commonly known as: PLAVIX Take 75 mg by mouth daily.   metFORMIN 500 MG tablet Commonly known as: GLUCOPHAGE Take 500 mg by mouth 2 (two) times daily with a  meal.   oxybutynin 5 MG 24 hr tablet Commonly known as: DITROPAN-XL Take 5 mg by mouth at bedtime.   sennosides-docusate sodium 8.6-50 MG tablet Commonly known as: SENOKOT-S Take 1 tablet by mouth in the morning and at bedtime.   VITAMIN D PO Take 1 tablet by mouth daily. 1,000 units   zinc oxide 20 % ointment Apply 1 application topically as needed for irritation.        Review of Systems  Constitutional:  Positive for activity change.  HENT: Negative.    Respiratory: Negative.    Cardiovascular: Negative.   Gastrointestinal: Negative.   Genitourinary: Negative.   Musculoskeletal:  Positive for gait problem.  Skin: Negative.   Neurological:  Negative for dizziness.  Psychiatric/Behavioral:  Positive for confusion.    Vitals:   09/11/21 1101  BP: (!) 156/79  Pulse: 73  Resp: 18  Temp: (!) 97.5 F (36.4 C)  SpO2: 98%  Weight: 145 lb (65.8 kg)  Height: 5\' 3"  (1.6 m)   Body mass index is 25.69 kg/m. Physical Exam Constitutional:  Well-developed and well-nourished.  HENT:  Head: Normocephalic.  Mouth/Throat: Oropharynx is clear and moist.  Eyes: Pupils are equal, round, and reactive to light.  EOM both Eyes was normal. Slightly hard to examine as she would not follow Commands Neck: Neck supple.  Cardiovascular: Normal rate and normal heart sounds.  No murmur heard. Pulmonary/Chest: Effort normal and breath sounds normal. No respiratory distress. No wheezes. She has no rales.  Abdominal: Soft. Bowel sounds are normal. No distension. There is no tenderness. There is no rebound.  Musculoskeletal: No edema.   Lymphadenopathy: none Neurological: Alert No Focal Deficits Skin: Skin is warm and dry.  Psychiatric: Normal mood and affect. Behavior is normal. Thought content normal.   Labs reviewed: Basic Metabolic Panel: Recent Labs    02/14/21 0000  NA 138  K 4.1  CL 105  CO2 24*  BUN 21  CREATININE 0.8  CALCIUM 9.0   Liver Function Tests: Recent Labs    02/14/21 0000  ALBUMIN 3.7   No results for input(s): LIPASE, AMYLASE in the last 8760 hours. No results for input(s): AMMONIA in the last 8760 hours. CBC: Recent Labs    02/14/21 0000  WBC 6.2  NEUTROABS 3,168.00  HGB 12.1  HCT 37  PLT 248   Cardiac Enzymes: No results for input(s): CKTOTAL, CKMB, CKMBINDEX, TROPONINI in the last 8760 hours. BNP: Invalid input(s): POCBNP Lab Results  Component Value Date   HGBA1C 6.7 02/14/2021   Lab Results  Component Value Date   TSH 0.96 07/11/2020   No results found for: VITAMINB12 No results found for: FOLATE No results found for: IRON, TIBC, FERRITIN  Imaging and Procedures obtained prior to SNF admission: CT SHOULDER RIGHT WO CONTRAST  Result Date: 01/13/2021 CLINICAL DATA:  Evaluate humeral neck fracture. Patient fell in August of 2021. EXAM: CT OF THE UPPER RIGHT EXTREMITY WITHOUT CONTRAST TECHNIQUE: Multidetector CT imaging of the upper right extremity was performed according to the standard protocol. COMPARISON:  Radiographs 07/06/2020 FINDINGS: The humeral neck fracture is healed. Solid osseous union. Mild impaction and posterior rotation of the humeral head in relation to the humeral shaft. Minimal/mild glenohumeral joint degenerative changes. The [facility] joint is intact. Mild degenerative changes. Grossly by CT the rotator cuff tendons are intact. No findings suspicious for a full-thickness retracted rotator cuff tear. The rotator cuff muscles are grossly normal. The visualized right ribs are intact. Possible remote healed posterior glenoid fractures  versus is spurring change.  There is a 5 mm peripheral nodular lesion in the right lung difficult to accurately assess because of breathing motion artifact. I would recommend a full chest CT with contrast for further evaluation. IMPRESSION: 1. Healed humeral neck fracture. 2. Minimal/mild glenohumeral joint degenerative changes. Possible remote healed posterior glenoid fracture versus is spurring change. 3. Grossly by CT the rotator cuff tendons are intact. 4. 5 mm peripheral nodular lesion in the right lung. Recommend chest CT with contrast for further evaluation. Electronically Signed   By: Rudie Meyer M.D.   On: 01/13/2021 10:22    Assessment/Plan Type 2 diabetes mellitus with other neurologic complication, without long-term current use of insulin (HCC) Last A1C was 7.9 Will do CBG 3/week for few weeks  Will need change in her Metformin dose Off ACE due to low BP  Neurocognitive disorder Further decline Now on SNF   Age related osteoporosis,  Was on Fsamax before But not anymore. Family does not want follow up of DEXA right now Lung nodule Follow with Xrays in facility in few months No CT right now Ataxia Needs more help Getting therapy Dyslipidemia On statin LDL 83 Urinary incontinence, unspecified type On Ditropan Left Eye issue Per family left Eye not moving with Right eye I was unable to examine it properly due to her not following all commands Will write for her to follow opthalmologist  Family/ staff Communication:   Labs/tests ordered:

## 2021-09-12 ENCOUNTER — Encounter: Payer: Self-pay | Admitting: Internal Medicine

## 2021-09-14 NOTE — Progress Notes (Signed)
A user error has taken place.

## 2021-09-15 DIAGNOSIS — R296 Repeated falls: Secondary | ICD-10-CM | POA: Diagnosis not present

## 2021-09-15 DIAGNOSIS — M6281 Muscle weakness (generalized): Secondary | ICD-10-CM | POA: Diagnosis not present

## 2021-09-15 DIAGNOSIS — Z9181 History of falling: Secondary | ICD-10-CM | POA: Diagnosis not present

## 2021-09-15 DIAGNOSIS — R2681 Unsteadiness on feet: Secondary | ICD-10-CM | POA: Diagnosis not present

## 2021-09-15 DIAGNOSIS — Z8673 Personal history of transient ischemic attack (TIA), and cerebral infarction without residual deficits: Secondary | ICD-10-CM | POA: Diagnosis not present

## 2021-09-15 DIAGNOSIS — R41841 Cognitive communication deficit: Secondary | ICD-10-CM | POA: Diagnosis not present

## 2021-09-16 DIAGNOSIS — M6281 Muscle weakness (generalized): Secondary | ICD-10-CM | POA: Diagnosis not present

## 2021-09-16 DIAGNOSIS — R296 Repeated falls: Secondary | ICD-10-CM | POA: Diagnosis not present

## 2021-09-16 DIAGNOSIS — R41841 Cognitive communication deficit: Secondary | ICD-10-CM | POA: Diagnosis not present

## 2021-09-16 DIAGNOSIS — Z8673 Personal history of transient ischemic attack (TIA), and cerebral infarction without residual deficits: Secondary | ICD-10-CM | POA: Diagnosis not present

## 2021-09-16 DIAGNOSIS — Z9181 History of falling: Secondary | ICD-10-CM | POA: Diagnosis not present

## 2021-09-16 DIAGNOSIS — R2681 Unsteadiness on feet: Secondary | ICD-10-CM | POA: Diagnosis not present

## 2021-09-17 DIAGNOSIS — M6281 Muscle weakness (generalized): Secondary | ICD-10-CM | POA: Diagnosis not present

## 2021-09-17 DIAGNOSIS — R41841 Cognitive communication deficit: Secondary | ICD-10-CM | POA: Diagnosis not present

## 2021-09-17 DIAGNOSIS — Z8673 Personal history of transient ischemic attack (TIA), and cerebral infarction without residual deficits: Secondary | ICD-10-CM | POA: Diagnosis not present

## 2021-09-17 DIAGNOSIS — R2681 Unsteadiness on feet: Secondary | ICD-10-CM | POA: Diagnosis not present

## 2021-09-17 DIAGNOSIS — Z9181 History of falling: Secondary | ICD-10-CM | POA: Diagnosis not present

## 2021-09-17 DIAGNOSIS — R296 Repeated falls: Secondary | ICD-10-CM | POA: Diagnosis not present

## 2021-09-18 DIAGNOSIS — R2681 Unsteadiness on feet: Secondary | ICD-10-CM | POA: Diagnosis not present

## 2021-09-18 DIAGNOSIS — Z8673 Personal history of transient ischemic attack (TIA), and cerebral infarction without residual deficits: Secondary | ICD-10-CM | POA: Diagnosis not present

## 2021-09-18 DIAGNOSIS — M6281 Muscle weakness (generalized): Secondary | ICD-10-CM | POA: Diagnosis not present

## 2021-09-18 DIAGNOSIS — R41841 Cognitive communication deficit: Secondary | ICD-10-CM | POA: Diagnosis not present

## 2021-09-18 DIAGNOSIS — Z9181 History of falling: Secondary | ICD-10-CM | POA: Diagnosis not present

## 2021-09-18 DIAGNOSIS — R296 Repeated falls: Secondary | ICD-10-CM | POA: Diagnosis not present

## 2021-09-19 DIAGNOSIS — Z9181 History of falling: Secondary | ICD-10-CM | POA: Diagnosis not present

## 2021-09-19 DIAGNOSIS — Z8673 Personal history of transient ischemic attack (TIA), and cerebral infarction without residual deficits: Secondary | ICD-10-CM | POA: Diagnosis not present

## 2021-09-19 DIAGNOSIS — R2681 Unsteadiness on feet: Secondary | ICD-10-CM | POA: Diagnosis not present

## 2021-09-19 DIAGNOSIS — M6281 Muscle weakness (generalized): Secondary | ICD-10-CM | POA: Diagnosis not present

## 2021-09-19 DIAGNOSIS — R296 Repeated falls: Secondary | ICD-10-CM | POA: Diagnosis not present

## 2021-09-19 DIAGNOSIS — R41841 Cognitive communication deficit: Secondary | ICD-10-CM | POA: Diagnosis not present

## 2021-09-22 DIAGNOSIS — R41841 Cognitive communication deficit: Secondary | ICD-10-CM | POA: Diagnosis not present

## 2021-09-22 DIAGNOSIS — R296 Repeated falls: Secondary | ICD-10-CM | POA: Diagnosis not present

## 2021-09-22 DIAGNOSIS — R2681 Unsteadiness on feet: Secondary | ICD-10-CM | POA: Diagnosis not present

## 2021-09-22 DIAGNOSIS — Z8673 Personal history of transient ischemic attack (TIA), and cerebral infarction without residual deficits: Secondary | ICD-10-CM | POA: Diagnosis not present

## 2021-09-22 DIAGNOSIS — Z9181 History of falling: Secondary | ICD-10-CM | POA: Diagnosis not present

## 2021-09-22 DIAGNOSIS — M6281 Muscle weakness (generalized): Secondary | ICD-10-CM | POA: Diagnosis not present

## 2021-09-23 DIAGNOSIS — R296 Repeated falls: Secondary | ICD-10-CM | POA: Diagnosis not present

## 2021-09-23 DIAGNOSIS — R2681 Unsteadiness on feet: Secondary | ICD-10-CM | POA: Diagnosis not present

## 2021-09-23 DIAGNOSIS — Z9181 History of falling: Secondary | ICD-10-CM | POA: Diagnosis not present

## 2021-09-23 DIAGNOSIS — Z8673 Personal history of transient ischemic attack (TIA), and cerebral infarction without residual deficits: Secondary | ICD-10-CM | POA: Diagnosis not present

## 2021-09-23 DIAGNOSIS — R41841 Cognitive communication deficit: Secondary | ICD-10-CM | POA: Diagnosis not present

## 2021-09-23 DIAGNOSIS — M6281 Muscle weakness (generalized): Secondary | ICD-10-CM | POA: Diagnosis not present

## 2021-09-24 DIAGNOSIS — R2681 Unsteadiness on feet: Secondary | ICD-10-CM | POA: Diagnosis not present

## 2021-09-24 DIAGNOSIS — Z9181 History of falling: Secondary | ICD-10-CM | POA: Diagnosis not present

## 2021-09-24 DIAGNOSIS — R296 Repeated falls: Secondary | ICD-10-CM | POA: Diagnosis not present

## 2021-09-24 DIAGNOSIS — Z8673 Personal history of transient ischemic attack (TIA), and cerebral infarction without residual deficits: Secondary | ICD-10-CM | POA: Diagnosis not present

## 2021-09-24 DIAGNOSIS — M6281 Muscle weakness (generalized): Secondary | ICD-10-CM | POA: Diagnosis not present

## 2021-09-24 DIAGNOSIS — R41841 Cognitive communication deficit: Secondary | ICD-10-CM | POA: Diagnosis not present

## 2021-09-25 DIAGNOSIS — R41841 Cognitive communication deficit: Secondary | ICD-10-CM | POA: Diagnosis not present

## 2021-09-25 DIAGNOSIS — M6281 Muscle weakness (generalized): Secondary | ICD-10-CM | POA: Diagnosis not present

## 2021-09-25 DIAGNOSIS — Z8673 Personal history of transient ischemic attack (TIA), and cerebral infarction without residual deficits: Secondary | ICD-10-CM | POA: Diagnosis not present

## 2021-09-25 DIAGNOSIS — R2681 Unsteadiness on feet: Secondary | ICD-10-CM | POA: Diagnosis not present

## 2021-09-25 DIAGNOSIS — Z9181 History of falling: Secondary | ICD-10-CM | POA: Diagnosis not present

## 2021-09-25 DIAGNOSIS — R296 Repeated falls: Secondary | ICD-10-CM | POA: Diagnosis not present

## 2021-09-26 DIAGNOSIS — Z8673 Personal history of transient ischemic attack (TIA), and cerebral infarction without residual deficits: Secondary | ICD-10-CM | POA: Diagnosis not present

## 2021-09-26 DIAGNOSIS — R296 Repeated falls: Secondary | ICD-10-CM | POA: Diagnosis not present

## 2021-09-26 DIAGNOSIS — R2681 Unsteadiness on feet: Secondary | ICD-10-CM | POA: Diagnosis not present

## 2021-09-26 DIAGNOSIS — M6281 Muscle weakness (generalized): Secondary | ICD-10-CM | POA: Diagnosis not present

## 2021-09-26 DIAGNOSIS — Z9181 History of falling: Secondary | ICD-10-CM | POA: Diagnosis not present

## 2021-09-26 DIAGNOSIS — R41841 Cognitive communication deficit: Secondary | ICD-10-CM | POA: Diagnosis not present

## 2021-09-29 DIAGNOSIS — R2681 Unsteadiness on feet: Secondary | ICD-10-CM | POA: Diagnosis not present

## 2021-09-29 DIAGNOSIS — R296 Repeated falls: Secondary | ICD-10-CM | POA: Diagnosis not present

## 2021-09-29 DIAGNOSIS — R41841 Cognitive communication deficit: Secondary | ICD-10-CM | POA: Diagnosis not present

## 2021-09-29 DIAGNOSIS — Z9181 History of falling: Secondary | ICD-10-CM | POA: Diagnosis not present

## 2021-09-29 DIAGNOSIS — M6281 Muscle weakness (generalized): Secondary | ICD-10-CM | POA: Diagnosis not present

## 2021-09-29 DIAGNOSIS — Z8673 Personal history of transient ischemic attack (TIA), and cerebral infarction without residual deficits: Secondary | ICD-10-CM | POA: Diagnosis not present

## 2021-09-30 DIAGNOSIS — R2681 Unsteadiness on feet: Secondary | ICD-10-CM | POA: Diagnosis not present

## 2021-09-30 DIAGNOSIS — Z8673 Personal history of transient ischemic attack (TIA), and cerebral infarction without residual deficits: Secondary | ICD-10-CM | POA: Diagnosis not present

## 2021-09-30 DIAGNOSIS — M6281 Muscle weakness (generalized): Secondary | ICD-10-CM | POA: Diagnosis not present

## 2021-09-30 DIAGNOSIS — R41841 Cognitive communication deficit: Secondary | ICD-10-CM | POA: Diagnosis not present

## 2021-09-30 DIAGNOSIS — Z9181 History of falling: Secondary | ICD-10-CM | POA: Diagnosis not present

## 2021-09-30 DIAGNOSIS — R296 Repeated falls: Secondary | ICD-10-CM | POA: Diagnosis not present

## 2021-10-01 DIAGNOSIS — R296 Repeated falls: Secondary | ICD-10-CM | POA: Diagnosis not present

## 2021-10-01 DIAGNOSIS — R41841 Cognitive communication deficit: Secondary | ICD-10-CM | POA: Diagnosis not present

## 2021-10-01 DIAGNOSIS — R2681 Unsteadiness on feet: Secondary | ICD-10-CM | POA: Diagnosis not present

## 2021-10-01 DIAGNOSIS — M6281 Muscle weakness (generalized): Secondary | ICD-10-CM | POA: Diagnosis not present

## 2021-10-01 DIAGNOSIS — Z8673 Personal history of transient ischemic attack (TIA), and cerebral infarction without residual deficits: Secondary | ICD-10-CM | POA: Diagnosis not present

## 2021-10-01 DIAGNOSIS — Z9181 History of falling: Secondary | ICD-10-CM | POA: Diagnosis not present

## 2021-10-03 DIAGNOSIS — R2681 Unsteadiness on feet: Secondary | ICD-10-CM | POA: Diagnosis not present

## 2021-10-03 DIAGNOSIS — R296 Repeated falls: Secondary | ICD-10-CM | POA: Diagnosis not present

## 2021-10-03 DIAGNOSIS — Z9181 History of falling: Secondary | ICD-10-CM | POA: Diagnosis not present

## 2021-10-03 DIAGNOSIS — Z8673 Personal history of transient ischemic attack (TIA), and cerebral infarction without residual deficits: Secondary | ICD-10-CM | POA: Diagnosis not present

## 2021-10-03 DIAGNOSIS — M6281 Muscle weakness (generalized): Secondary | ICD-10-CM | POA: Diagnosis not present

## 2021-10-03 DIAGNOSIS — R41841 Cognitive communication deficit: Secondary | ICD-10-CM | POA: Diagnosis not present

## 2021-10-06 DIAGNOSIS — Z8673 Personal history of transient ischemic attack (TIA), and cerebral infarction without residual deficits: Secondary | ICD-10-CM | POA: Diagnosis not present

## 2021-10-06 DIAGNOSIS — M6281 Muscle weakness (generalized): Secondary | ICD-10-CM | POA: Diagnosis not present

## 2021-10-06 DIAGNOSIS — R296 Repeated falls: Secondary | ICD-10-CM | POA: Diagnosis not present

## 2021-10-06 DIAGNOSIS — Z9181 History of falling: Secondary | ICD-10-CM | POA: Diagnosis not present

## 2021-10-06 DIAGNOSIS — R41841 Cognitive communication deficit: Secondary | ICD-10-CM | POA: Diagnosis not present

## 2021-10-06 DIAGNOSIS — R2681 Unsteadiness on feet: Secondary | ICD-10-CM | POA: Diagnosis not present

## 2021-10-07 DIAGNOSIS — Z8673 Personal history of transient ischemic attack (TIA), and cerebral infarction without residual deficits: Secondary | ICD-10-CM | POA: Diagnosis not present

## 2021-10-07 DIAGNOSIS — R41841 Cognitive communication deficit: Secondary | ICD-10-CM | POA: Diagnosis not present

## 2021-10-07 DIAGNOSIS — R2681 Unsteadiness on feet: Secondary | ICD-10-CM | POA: Diagnosis not present

## 2021-10-07 DIAGNOSIS — R296 Repeated falls: Secondary | ICD-10-CM | POA: Diagnosis not present

## 2021-10-07 DIAGNOSIS — Z9181 History of falling: Secondary | ICD-10-CM | POA: Diagnosis not present

## 2021-10-07 DIAGNOSIS — M6281 Muscle weakness (generalized): Secondary | ICD-10-CM | POA: Diagnosis not present

## 2021-10-08 DIAGNOSIS — Z9181 History of falling: Secondary | ICD-10-CM | POA: Diagnosis not present

## 2021-10-08 DIAGNOSIS — R41841 Cognitive communication deficit: Secondary | ICD-10-CM | POA: Diagnosis not present

## 2021-10-08 DIAGNOSIS — Z8673 Personal history of transient ischemic attack (TIA), and cerebral infarction without residual deficits: Secondary | ICD-10-CM | POA: Diagnosis not present

## 2021-10-08 DIAGNOSIS — R2681 Unsteadiness on feet: Secondary | ICD-10-CM | POA: Diagnosis not present

## 2021-10-08 DIAGNOSIS — M6281 Muscle weakness (generalized): Secondary | ICD-10-CM | POA: Diagnosis not present

## 2021-10-08 DIAGNOSIS — R296 Repeated falls: Secondary | ICD-10-CM | POA: Diagnosis not present

## 2021-10-14 ENCOUNTER — Encounter: Payer: Self-pay | Admitting: Nurse Practitioner

## 2021-10-14 ENCOUNTER — Non-Acute Institutional Stay (SKILLED_NURSING_FACILITY): Payer: Medicare Other | Admitting: Nurse Practitioner

## 2021-10-14 DIAGNOSIS — Z8673 Personal history of transient ischemic attack (TIA), and cerebral infarction without residual deficits: Secondary | ICD-10-CM

## 2021-10-14 DIAGNOSIS — E1149 Type 2 diabetes mellitus with other diabetic neurological complication: Secondary | ICD-10-CM | POA: Diagnosis not present

## 2021-10-14 DIAGNOSIS — R911 Solitary pulmonary nodule: Secondary | ICD-10-CM

## 2021-10-14 DIAGNOSIS — M810 Age-related osteoporosis without current pathological fracture: Secondary | ICD-10-CM

## 2021-10-14 DIAGNOSIS — R270 Ataxia, unspecified: Secondary | ICD-10-CM

## 2021-10-14 DIAGNOSIS — R32 Unspecified urinary incontinence: Secondary | ICD-10-CM

## 2021-10-14 DIAGNOSIS — I10 Essential (primary) hypertension: Secondary | ICD-10-CM

## 2021-10-14 DIAGNOSIS — R419 Unspecified symptoms and signs involving cognitive functions and awareness: Secondary | ICD-10-CM | POA: Diagnosis not present

## 2021-10-14 DIAGNOSIS — S42294D Other nondisplaced fracture of upper end of right humerus, subsequent encounter for fracture with routine healing: Secondary | ICD-10-CM

## 2021-10-14 DIAGNOSIS — E785 Hyperlipidemia, unspecified: Secondary | ICD-10-CM

## 2021-10-14 NOTE — Assessment & Plan Note (Signed)
Increase Metformin to 828m bid, CBG 170-180s average. Hgb a1c 7.9 06/19/21. Update Hgb a1c, CBC/diff, CMP/eGFR, lipid panel, TSH.

## 2021-10-14 NOTE — Assessment & Plan Note (Signed)
takes Plavix, Atorvastatin.

## 2021-10-14 NOTE — Assessment & Plan Note (Signed)
,   off Alendronate, on Vit D, Ca. No further DEXA per HPOA

## 2021-10-14 NOTE — Assessment & Plan Note (Signed)
,   affecting frontal subcortical abilities. supportive care in SNF [facility]

## 2021-10-14 NOTE — Assessment & Plan Note (Signed)
takes Information systems manager.

## 2021-10-14 NOTE — Assessment & Plan Note (Signed)
Incidental lung nodule 01/13/21 2. 5 mm peripheral nodular lesion in the right lung. Followed up with the patient's sister HPOA Moody Bruins @ [phone] regarding the incidental finding 42mm nodule in the right lung on the CT scan R shoulder 01/13/21. The decision of watchful waiting has been made with the ordering orthopedic physician.

## 2021-10-14 NOTE — Assessment & Plan Note (Signed)
R shoulder, takes Tylenol. 01/13/21 CT R shoulder Healed humeral neck fracture.

## 2021-10-14 NOTE — Assessment & Plan Note (Signed)
Takes Atorvastatin.  

## 2021-10-14 NOTE — Progress Notes (Signed)
Location:   S[facility] [place] Room Number: 4 A Place of Service:  SNF (31) Provider: [facility] Mikeria Valin NP  [name], MD  Patient Care Team: [name], MD as PCP - General (Internal Medicine) [name], DO as Consulting Physician (Neurology)  Extended Emergency Contact Information Primary Emergency Contact: [name],[name] Address: [address]          [place] [address] [place] of [place] Phone: [phone] Relation: Sister Secondary Emergency Contact: [facility] Address: [address] [place], [place] [address] [name] of [place] Mobile Phone: [phone] Relation: Brother  Code Status:  DNR Goals of care: Advanced Directive information Advanced Directives 10/17/2021  Does Patient Have a Medical Advance Directive? Yes  Type of Paramedic of [place];Living will  Does patient want to make changes to medical advance directive? No - Patient declined  Copy of [place] in Chart? Yes - validated most recent copy scanned in chart (See row information)  Would patient like information on creating a medical advance directive? -  Pre-existing out of facility DNR order (yellow form or pink MOST form) -     Chief Complaint  Patient presents with   Medical Management of Chronic Issues    Routine visit   Quality Metric Gaps    Pneumonia Vaccine, Eye exam, urine microalbumin, Hep C screening, TDAP, Shingrix, COVID#3, Hgb A1C    HPI:  Pt is a 80 y.o. female seen today for medical management of chronic diseases.      Constipation, takes Senokot S             OP, off Alendronate, on Vit D, Ca. No further DEXA per HPOA             T2DM, takes Metformin, CBG 170-180s average. Hgb a1c 8.3 10/16/21             HTN, off Ramipril, Atorvastatin, Plavix, Bun/creat 16/0.8 10/16/21             Tremor/Ataxia syndrome, frequent falls, saw Neurology, MRI showed pathologic atrophy cerebellum              Nerocoognitive dysfunction/dementia, affecting frontal subcortical abilities. supportive care in SNF [facility]             Urinary frequency, takes Vesicare.                 OA, R shoulder, takes Tylenol. 01/13/21 CT R shoulder Healed humeral neck fracture.             Incidental lung nodule 01/13/21 2. 5 mm peripheral nodular lesion in the right lung. Followed up with the patient's sister HPOA Jeral Fruit @ [phone] regarding the incidental finding 77m nodule in the right lung on the CT scan R shoulder 01/13/21. The decision of watchful waiting has been made with the ordering orthopedic physician.              Hx of TIA, takes Plavix, Atorvastatin.   Hyperlipidemia, on Atorvastatin, LDL 67 10/16/21   Past Medical History:  Diagnosis Date   Diabetes mellitus    Dyslipidemia    Hypertension    Osteoporosis    Urinary incontinence    History reviewed. No pertinent surgical history.  Allergies  Allergen Reactions   Amoxicillin-Pot Clavulanate Hives   Meloxicam     Unknown reaction   Scallops [Shellfish Allergy] Other (See Comments)    "  I don't feel so good after an hour"   Toviaz [Fesoterodine Fumarate Er]     Unknown reaction    Allergies as of 10/14/2021       Reactions   Amoxicillin-pot Clavulanate Hives   Meloxicam    Unknown reaction   Scallops [shellfish Allergy] Other (See Comments)   "I don't feel so good after an hour"   Toviaz [fesoterodine Fumarate Er]    Unknown reaction        Medication List        Accurate as of October 14, 2021 11:59 PM. If you have any questions, ask your nurse or doctor.          acetaminophen 325 MG tablet Commonly known as: TYLENOL Take 650 mg by mouth 2 (two) times daily.   atorvastatin 40 MG tablet Commonly known as: LIPITOR Take 1 tablet (40 mg total) by mouth daily.   CALTRATE 600+D PO Take 1 tablet by mouth 2 (two) times daily.   CENTRUM SILVER PO Take 1 tablet by mouth daily.   clopidogrel 75 MG tablet Commonly known  as: PLAVIX Take 75 mg by mouth daily.   metFORMIN 500 MG tablet Commonly known as: GLUCOPHAGE Take 500 mg by mouth 2 (two) times daily with a meal.   oxybutynin 5 MG 24 hr tablet Commonly known as: DITROPAN-XL Take 5 mg by mouth at bedtime.   sennosides-docusate sodium 8.6-50 MG tablet Commonly known as: SENOKOT-S Take 1 tablet by mouth in the morning and at bedtime.   VITAMIN D PO Take 1 tablet by mouth daily. 1,000 units   zinc oxide 20 % ointment Apply 1 application topically as needed for irritation.        Review of Systems  Constitutional:  Negative for fatigue, fever and unexpected weight change.  HENT:  Negative for congestion, trouble swallowing and voice change.   Eyes:  Negative for visual disturbance.  Respiratory:  Negative for shortness of breath and wheezing.   Cardiovascular:  Negative for chest pain, palpitations and leg swelling.  Gastrointestinal:  Negative for abdominal pain and constipation.  Genitourinary:  Positive for frequency. Negative for difficulty urinating.  Musculoskeletal:  Positive for arthralgias and gait problem.  Skin:  Negative for color change.  Neurological:  Positive for tremors. Negative for speech difficulty, weakness and headaches.  Psychiatric/Behavioral:  Negative for behavioral problems and sleep disturbance. The patient is not nervous/anxious.    Immunization History  Administered Date(s) Administered   Influenza Inj Mdck Quad Pf 08/10/2019   Influenza-Unspecified 08/13/2020, 09/02/2021   Moderna SARS-COV2 Booster Vaccination 04/16/2021   Moderna Sars-Covid-2 Vaccination 12/11/2019, 01/08/2020   Unspecified SARS-COV-2 Vaccination 09/23/2020, 07/30/2021   Zoster, Live 07/01/2020   Pertinent  Health Maintenance Due  Topic Date Due   OPHTHALMOLOGY EXAM  Never done   URINE MICROALBUMIN  Never done   HEMOGLOBIN A1C  04/16/2022   FOOT EXAM  08/20/2022   INFLUENZA VACCINE  Completed   DEXA SCAN  Completed   Fall Risk  07/07/2020 07/08/2020 07/08/2020 07/10/2020 06/25/2021  Falls in the past year? - - - - 1  Was there an injury with Fall? - - - - 0  Fall Risk Category Calculator - - - - 1  Fall Risk Category - - - - Low  Patient Fall Risk Level High fall risk Low fall risk Moderate fall risk Moderate fall risk Moderate fall risk  Patient at Risk for Falls Due to - - - - History of fall(s);Impaired balance/gait;Impaired mobility  Fall risk Follow up - - - - Falls evaluation completed;Education provided;Falls prevention discussed   Functional Status Survey:    Vitals:   10/14/21 1130  BP: 119/64  Pulse: 68  Resp: 20  Temp: 97.9 F (36.6 C)  SpO2: 97%  Weight: 153 lb 12.8 oz (69.8 kg)  Height: _0  (1.6 m)   Body mass index is 27.24 kg/m. Physical Exam Vitals and nursing note reviewed.  Constitutional:      Appearance: Normal appearance. She is normal weight.  HENT:     Head: Normocephalic.     Comments: Right face bruise, Zygomatic arch fx    Mouth/Throat:     Mouth: Mucous membranes are moist.  Eyes:     Extraocular Movements: Extraocular movements intact.     Conjunctiva/sclera: Conjunctivae normal.     Pupils: Pupils are equal, round, and reactive to light.  Cardiovascular:     Rate and Rhythm: Normal rate and regular rhythm.     Heart sounds: No murmur heard. Pulmonary:     Effort: Pulmonary effort is normal.     Breath sounds: No rales.  Abdominal:     General: Bowel sounds are normal.     Palpations: Abdomen is soft.     Tenderness: There is no abdominal tenderness. There is no rebound.  Musculoskeletal:     Cervical back: Normal range of motion and neck supple.     Right lower leg: No edema.     Left lower leg: No edema.  Skin:    General: Skin is warm and dry.  Neurological:     General: No focal deficit present.     Mental Status: She is alert and oriented to person, place, and time. Mental status is at baseline.     Motor: No weakness.     Coordination: Coordination  abnormal.     Gait: Gait abnormal.  Psychiatric:        Mood and Affect: Mood normal.        Behavior: Behavior normal.        Thought Content: Thought content normal.    Labs reviewed: Recent Labs    02/14/21 0000 10/16/21 0000  NA 138 139  K 4.1 3.9  CL 105 104  CO2 24* 24*  BUN 21 16  CREATININE 0.8 0.8  CALCIUM 9.0 8.9   Recent Labs    02/14/21 0000 10/16/21 0000  AST  --  12*  ALT  --  11  ALKPHOS  --  37  ALBUMIN 3.7 3.6   Recent Labs    02/14/21 0000 10/16/21 0000  WBC 6.2  --   NEUTROABS 3,168.00 2,576.00  HGB 12.1 11.9*  HCT 37 35*  PLT 248 224   Lab Results  Component Value Date   TSH 1.14 10/16/2021   Lab Results  Component Value Date   HGBA1C 8.3 10/16/2021   Lab Results  Component Value Date   CHOL 131 10/16/2021   HDL 37 10/16/2021   LDLCALC 67 10/16/2021   TRIG 196 (A) 10/16/2021   CHOLHDL 4.5 08/20/2019    Significant Diagnostic Results in last 30 days:  No results found.  Assessment/Plan T2DM (type 2 diabetes mellitus) (HCC) Increase Metformin to 853m bid, CBG 170-180s average. Hgb a1c 7.9 06/19/21. Update Hgb a1c, CBC/diff, CMP/eGFR, lipid panel, TSH.   Hypertension off Ramipril, Atorvastatin, Plavix, Bun/creat 16/0.88 eGFR 68 06/19/21  Ataxia , frequent falls, saw Neurology, MRI showed pathologic atrophy cerebellum  Neurocognitive disorder , affecting frontal subcortical  abilities. supportive care in SNF [facility]  Urinary incontinence takes Vesicare.     Closed fracture of right proximal humerus R shoulder, takes Tylenol. 01/13/21 CT R shoulder Healed humeral neck fracture.  Osteoporosis , off Alendronate, on Vit D, Ca. No further DEXA per HPOA  Solitary pulmonary nodule on lung CT Incidental lung nodule 01/13/21 2. 5 mm peripheral nodular lesion in the right lung. Followed up with the patient's sister HPOA Jeral Fruit @ [phone] regarding the incidental finding 16m nodule in the right lung on the CT scan R shoulder  01/13/21. The decision of watchful waiting has been made with the ordering orthopedic physician.   History of TIA (transient ischemic attack)  takes Plavix, Atorvastatin.   Dyslipidemia Takes Atorvastatin   Family/ staff Communication: plan of care reviewed with the patient and charge nurse.   Labs/tests ordered:  CBC/diff, CMP/eGFR, TSH, Hgb a1c, lipid panel.   Time spend 35 minutes.

## 2021-10-14 NOTE — Assessment & Plan Note (Signed)
,   frequent falls, saw Neurology, MRI showed pathologic atrophy cerebellum

## 2021-10-14 NOTE — Assessment & Plan Note (Signed)
off Ramipril, Atorvastatin, Plavix, Bun/creat 16/0.88 eGFR 68 06/19/21

## 2021-10-16 DIAGNOSIS — E119 Type 2 diabetes mellitus without complications: Secondary | ICD-10-CM | POA: Diagnosis not present

## 2021-10-16 LAB — BASIC METABOLIC PANEL
BUN: 16 (ref 4–21)
CO2: 24 — AB (ref 13–22)
Chloride: 104 (ref 99–108)
Creatinine: 0.8 (ref 0.5–1.1)
Glucose: 144
Potassium: 3.9 (ref 3.4–5.3)
Sodium: 139 (ref 137–147)

## 2021-10-16 LAB — TSH: TSH: 1.14 (ref 0.41–5.90)

## 2021-10-16 LAB — COMPREHENSIVE METABOLIC PANEL
Albumin: 3.6 (ref 3.5–5.0)
Calcium: 8.9 (ref 8.7–10.7)
Globulin: 2

## 2021-10-16 LAB — LIPID PANEL
Cholesterol: 131 (ref 0–200)
HDL: 37 (ref 35–70)
LDL Cholesterol: 67
LDl/HDL Ratio: 3.5
Triglycerides: 196 — AB (ref 40–160)

## 2021-10-16 LAB — CBC AND DIFFERENTIAL
HCT: 35 — AB (ref 36–46)
Hemoglobin: 11.9 — AB (ref 12.0–16.0)
Neutrophils Absolute: 2576
Platelets: 224 (ref 150–399)

## 2021-10-16 LAB — HEPATIC FUNCTION PANEL
ALT: 11 (ref 7–35)
AST: 12 — AB (ref 13–35)
Alkaline Phosphatase: 37 (ref 25–125)
Bilirubin, Total: 0.4

## 2021-10-16 LAB — CBC: RBC: 3.86 — AB (ref 3.87–5.11)

## 2021-10-16 LAB — HEMOGLOBIN A1C: Hemoglobin A1C: 8.3

## 2021-10-17 ENCOUNTER — Non-Acute Institutional Stay (SKILLED_NURSING_FACILITY): Payer: Medicare Other | Admitting: Orthopedic Surgery

## 2021-10-17 ENCOUNTER — Encounter: Payer: Self-pay | Admitting: Nurse Practitioner

## 2021-10-17 ENCOUNTER — Encounter: Payer: Self-pay | Admitting: Orthopedic Surgery

## 2021-10-17 DIAGNOSIS — R911 Solitary pulmonary nodule: Secondary | ICD-10-CM

## 2021-10-17 DIAGNOSIS — E785 Hyperlipidemia, unspecified: Secondary | ICD-10-CM

## 2021-10-17 DIAGNOSIS — M810 Age-related osteoporosis without current pathological fracture: Secondary | ICD-10-CM

## 2021-10-17 DIAGNOSIS — R270 Ataxia, unspecified: Secondary | ICD-10-CM | POA: Diagnosis not present

## 2021-10-17 DIAGNOSIS — R419 Unspecified symptoms and signs involving cognitive functions and awareness: Secondary | ICD-10-CM

## 2021-10-17 DIAGNOSIS — E1149 Type 2 diabetes mellitus with other diabetic neurological complication: Secondary | ICD-10-CM

## 2021-10-17 NOTE — Progress Notes (Signed)
. Location:   [facility] Room Number: 4 A Place of Service:  SNF (31) Provider:  Hazle Nordmann, NP  Mahlon Gammon, MD  Patient Care Team: Mahlon Gammon, MD as PCP - General (Internal Medicine) Jaclyn Dallas, DO as Consulting Physician (Neurology)  Extended Emergency Contact Information Primary Emergency Contact: Guzman,Jaclyn Address: [address]          [place] [address] [name] Home Phone: [phone] Relation: Sister Secondary Emergency Contact: [facility] Address: [address] [place], [place] [address] [name] Mobile Phone: [phone] Relation: Brother  Code Status:  DNR Goals of care: Advanced Directive information Advanced Directives 10/17/2021  Does Patient Have a Medical Advance Directive? Yes  Type of Estate agent of [place];Living will  Does patient want to make changes to medical advance directive? No - Patient declined  Copy of Healthcare Power of Attorney in Chart? Yes - validated most recent copy scanned in chart (See row information)  Would patient like information on creating a medical advance directive? -  Pre-existing out of facility DNR order (yellow form or pink MOST form) -     Chief Complaint  Patient presents with   Acute Visit    Acute visit for elevated A1C    HPI:  Pt is a 80 y.o. female seen today for an acute visit for elevated hemoglobin A1c.   She currently resides on the skilled nursing unit at [facility] Past medical history includes: HTN, T2DM, constipation, neurocognitive disorder, osteoporosis, dyslipidemia, hx of lung nodule, lung nodule, and abnormal gait.   T2DM- A1c 6.9 (2021), 6.7 02/14/2021, 8.2 10/16/2021, no recent hypoglycemic events, remains on metformin 500 mg twice daily, regular diet Neurocognitive disorder- needing more help with ADLs Osteoporosis- past use of Fosamax, family does not want further bone density tests,  remains on calcium and vitamin D daily Lung nodule- 01/13/2021 2.5 mm nodule to right lung noted, 5 mm nodule to right shoulder also found by CT scan, watch/wait per ortho, CXR in future Ataxia- MRI noted pathologic atrophy to cerebellum, continues to fall frequently HLD- LDL 67 10/16/2021, remains on Lipitor  Last fall 2 weeks ago, no injury, ambulates with wheelchair.   Past Medical History:  Diagnosis Date   Diabetes mellitus    Dyslipidemia    Hypertension    Osteoporosis    Urinary incontinence    History reviewed. No pertinent surgical history.  Allergies  Allergen Reactions   Amoxicillin-Pot Clavulanate Hives   Meloxicam     Unknown reaction   Scallops [Shellfish Allergy] Other (See Comments)    "I don't feel so good after an hour"   Toviaz [Fesoterodine Fumarate Er]     Unknown reaction    Allergies as of 10/17/2021       Reactions   Amoxicillin-pot Clavulanate Hives   Meloxicam    Unknown reaction   Scallops [shellfish Allergy] Other (See Comments)   "I don't feel so good after an hour"   Toviaz [fesoterodine Fumarate Er]    Unknown reaction        Medication List        Accurate as of October 17, 2021 11:33 AM. If you have any questions, ask your nurse or doctor.          acetaminophen 325 MG tablet Commonly known as: TYLENOL Take 650 mg by mouth 2 (two) times daily.  atorvastatin 40 MG tablet Commonly known as: LIPITOR Take 1 tablet (40 mg total) by mouth daily.   CALTRATE 600+D PO Take 1 tablet by mouth 2 (two) times daily.   CENTRUM SILVER PO Take 1 tablet by mouth daily.   clopidogrel 75 MG tablet Commonly known as: PLAVIX Take 75 mg by mouth daily.   metFORMIN 500 MG tablet Commonly known as: GLUCOPHAGE Take 500 mg by mouth 2 (two) times daily with a meal.   oxybutynin 5 MG 24 hr tablet Commonly known as: DITROPAN-XL Take 5 mg by mouth at bedtime.   sennosides-docusate sodium 8.6-50 MG tablet Commonly known as:  SENOKOT-S Take 1 tablet by mouth in the morning and at bedtime.   VITAMIN D PO Take 1 tablet by mouth daily. 1,000 units   zinc oxide 20 % ointment Apply 1 application topically as needed for irritation.        Review of Systems  Unable to perform ROS: Dementia   Immunization History  Administered Date(s) Administered   Influenza Inj Mdck Quad Pf 08/10/2019   Influenza-Unspecified 08/13/2020, 09/02/2021   Moderna SARS-COV2 Booster Vaccination 04/16/2021   Moderna Sars-Covid-2 Vaccination 12/11/2019, 01/08/2020   Unspecified SARS-COV-2 Vaccination 09/23/2020, 07/30/2021   Zoster, Live 07/01/2020   Pertinent  Health Maintenance Due  Topic Date Due   OPHTHALMOLOGY EXAM  Never done   URINE MICROALBUMIN  Never done   HEMOGLOBIN A1C  04/16/2022   FOOT EXAM  08/20/2022   INFLUENZA VACCINE  Completed   DEXA SCAN  Completed   Fall Risk 07/07/2020 07/08/2020 07/08/2020 07/10/2020 06/25/2021  Falls in the past year? - - - - 1  Was there an injury with Fall? - - - - 0  Fall Risk Category Calculator - - - - 1  Fall Risk Category - - - - Low  Patient Fall Risk Level High fall risk Low fall risk Moderate fall risk Moderate fall risk Moderate fall risk  Patient at Risk for Falls Due to - - - - History of fall(s);Impaired balance/gait;Impaired mobility  Fall risk Follow up - - - - Falls evaluation completed;Education provided;Falls prevention discussed   Functional Status Survey:    Vitals:   10/17/21 1114  BP: 106/62  Pulse: 86  Resp: 17  Temp: (!) 97.5 F (36.4 C)  SpO2: 95%  Weight: 153 lb 12.8 oz (69.8 kg)  Height: 5\' 3"  (1.6 m)   Body mass index is 27.24 kg/m. Physical Exam Vitals reviewed.  Constitutional:      General: She is not in acute distress. HENT:     Head: Normocephalic.  Eyes:     General:        Right eye: No discharge.        Left eye: No discharge.  Neck:     Vascular: No carotid bruit.  Cardiovascular:     Rate and Rhythm: Normal rate and regular  rhythm.     Pulses: Normal pulses.     Heart sounds: Normal heart sounds. No murmur heard. Pulmonary:     Effort: Pulmonary effort is normal. No respiratory distress.     Breath sounds: Normal breath sounds. No wheezing.  Abdominal:     General: Bowel sounds are normal. There is no distension.     Tenderness: There is no abdominal tenderness.  Musculoskeletal:     Cervical back: Normal range of motion.     Right lower leg: No edema.     Left lower leg: No edema.  Lymphadenopathy:  Cervical: No cervical adenopathy.  Skin:    General: Skin is warm and dry.     Capillary Refill: Capillary refill takes less than 2 seconds.  Neurological:     General: No focal deficit present.     Mental Status: She is alert. Mental status is at baseline.     Cranial Nerves: Dysarthria present.     Motor: Weakness present.     Gait: Gait abnormal.     Comments: wheelchair  Psychiatric:        Mood and Affect: Mood normal.        Behavior: Behavior normal.    Labs reviewed: Recent Labs    02/14/21 0000 10/16/21 0000  NA 138 139  K 4.1 3.9  CL 105 104  CO2 24* 24*  BUN 21 16  CREATININE 0.8 0.8  CALCIUM 9.0 8.9   Recent Labs    02/14/21 0000 10/16/21 0000  AST  --  12*  ALT  --  11  ALKPHOS  --  37  ALBUMIN 3.7 3.6   Recent Labs    02/14/21 0000 10/16/21 0000  WBC 6.2  --   NEUTROABS 3,168.00 2,576.00  HGB 12.1 11.9*  HCT 37 35*  PLT 248 224   Lab Results  Component Value Date   TSH 1.14 10/16/2021   Lab Results  Component Value Date   HGBA1C 8.3 10/16/2021   Lab Results  Component Value Date   CHOL 131 10/16/2021   HDL 37 10/16/2021   LDLCALC 67 10/16/2021   TRIG 196 (A) 10/16/2021   CHOLHDL 4.5 08/20/2019    Significant Diagnostic Results in last 30 days:  No results found.  Assessment/Plan 1. Type 2 diabetes mellitus with other neurologic complication, without long-term current use of insulin (HCC) - A1c 8.2 10/16/2021 - blood sugars averaging  170-230's - increase metformin 750 mg QAM/ after meals - cont metformin 500 mg every evening after dinner - A1c- 3 months  2. Neurocognitive disorder - needing more help with ADLs - cont skilled nursing care  3. Lung nodule - 2.5 mm nodule to right lung noted - CXR- future  4. Ataxia - followed by neuro - frequent falls - cont falls safety precautions  5. Age related osteoporosis, unspecified pathological fracture presence - past use of fosamax - no further studies per family - cont calcium and vitamin D   6. Dyslipidemia - LDL 67 10/16/2021 - cont lipitor   Family/ staff Communication: plan discussed with patient and nurse  Labs/tests ordered:   A1c in 3 months

## 2021-11-13 ENCOUNTER — Non-Acute Institutional Stay (SKILLED_NURSING_FACILITY): Payer: Medicare Other | Admitting: Orthopedic Surgery

## 2021-11-13 ENCOUNTER — Encounter: Payer: Self-pay | Admitting: Orthopedic Surgery

## 2021-11-13 DIAGNOSIS — Z8673 Personal history of transient ischemic attack (TIA), and cerebral infarction without residual deficits: Secondary | ICD-10-CM

## 2021-11-13 DIAGNOSIS — I10 Essential (primary) hypertension: Secondary | ICD-10-CM

## 2021-11-13 DIAGNOSIS — E1169 Type 2 diabetes mellitus with other specified complication: Secondary | ICD-10-CM

## 2021-11-13 DIAGNOSIS — R634 Abnormal weight loss: Secondary | ICD-10-CM

## 2021-11-13 DIAGNOSIS — E1149 Type 2 diabetes mellitus with other diabetic neurological complication: Secondary | ICD-10-CM | POA: Diagnosis not present

## 2021-11-13 DIAGNOSIS — R419 Unspecified symptoms and signs involving cognitive functions and awareness: Secondary | ICD-10-CM | POA: Diagnosis not present

## 2021-11-13 DIAGNOSIS — R911 Solitary pulmonary nodule: Secondary | ICD-10-CM

## 2021-11-13 DIAGNOSIS — M810 Age-related osteoporosis without current pathological fracture: Secondary | ICD-10-CM

## 2021-11-13 DIAGNOSIS — R270 Ataxia, unspecified: Secondary | ICD-10-CM

## 2021-11-13 DIAGNOSIS — R32 Unspecified urinary incontinence: Secondary | ICD-10-CM

## 2021-11-13 DIAGNOSIS — E785 Hyperlipidemia, unspecified: Secondary | ICD-10-CM

## 2021-11-13 NOTE — Progress Notes (Deleted)
Location:  [facility] Room Number: N04 Place of Service:  SNF [phone]) Provider:  Amy Armandina Stammer, Freddie Breech, MD  Patient Care Team: Mahlon Gammon, MD as PCP - General (Internal Medicine) [name], DO as Consulting Physician (Neurology)  Extended Emergency Contact Information Primary Emergency Contact: [name],[name] Address: [address]          [place] [address] [place] Home Phone: [phone] Relation: Sister Secondary Emergency Contact: [facility] Address: [address] [place], [place] [address] [name] Mobile Phone: [phone] Relation: Brother  Code Status:  *** Goals of care: Advanced Directive information Advanced Directives 11/13/2021  Does Patient Have a Medical Advance Directive? Yes  Type of Estate agent of [place];Living will;Out of facility DNR (pink MOST or yellow form)  Does patient want to make changes to medical advance directive? No - Patient declined  Copy of Healthcare Power of Attorney in Chart? Yes - validated most recent copy scanned in chart (See row information)  Would patient like information on creating a medical advance directive? -  Pre-existing out of facility DNR order (yellow form or pink MOST form) Pink MOST/Yellow Form most recent copy in chart - Physician notified to receive inpatient order     Chief Complaint  Patient presents with   Medical Management of Chronic Issues    Routine    HPI:  [name] seen today for medical management of chronic diseases.     Past Medical History:  Diagnosis Date   Diabetes mellitus    Dyslipidemia    Hypertension    Osteoporosis    Urinary incontinence    History reviewed. No pertinent surgical history.  Allergies  Allergen Reactions   Amoxicillin-Pot Clavulanate Hives   Meloxicam     Unknown reaction   Scallops [Shellfish Allergy] Other (See Comments)    "I don't feel so good  after an hour"   Toviaz [Fesoterodine Fumarate Er]     Unknown reaction    Outpatient Encounter Medications as of 11/13/2021  Medication Sig   acetaminophen (TYLENOL) 325 MG tablet Take 650 mg by mouth 2 (two) times daily.   atorvastatin (LIPITOR) 40 MG tablet Take 1 tablet (40 mg total) by mouth daily.   Calcium Carbonate-Vitamin D (CALTRATE 600+D PO) Take 1 tablet by mouth 2 (two) times daily.   clopidogrel (PLAVIX) 75 MG tablet Take 75 mg by mouth daily.   metFORMIN (GLUCOPHAGE) 500 MG tablet Take 500 mg by mouth 2 (two) times daily with a meal.    Multiple Vitamins-Minerals (CENTRUM SILVER PO) Take 1 tablet by mouth daily.   oxybutynin (DITROPAN-XL) 5 MG 24 hr tablet Take 5 mg by mouth at bedtime.   sennosides-docusate sodium (SENOKOT-S) 8.6-50 MG tablet Take 1 tablet by mouth in the morning and at bedtime.   VITAMIN D PO Take 1 tablet by mouth daily. 1,000 units   zinc oxide 20 % ointment Apply 1 application topically as needed for irritation.   No facility-administered encounter medications on file as of 11/13/2021.    Review of Systems  Immunization History  Administered Date(s) Administered   Influenza Inj Mdck Quad Pf 08/10/2019   Influenza-Unspecified 08/13/2020, 09/02/2021   Moderna SARS-COV2 Booster Vaccination 04/16/2021   Moderna Sars-Covid-2 Vaccination 12/11/2019, 01/08/2020   Unspecified SARS-COV-2 Vaccination 09/23/2020, 07/30/2021   Zoster, Live 07/01/2020   Pertinent  Health Maintenance Due  Topic Date Due   OPHTHALMOLOGY EXAM  Never done   URINE MICROALBUMIN  Never done   HEMOGLOBIN A1C  04/16/2022   FOOT EXAM  08/20/2022   INFLUENZA VACCINE  Completed   DEXA SCAN  Completed   Fall Risk 07/07/2020 07/08/2020 07/08/2020 07/10/2020 06/25/2021  Falls in the past year? - - - - 1  Was there an injury with Fall? - - - - 0  Fall Risk Category Calculator - - - - 1  Fall Risk Category - - - - Low  Patient Fall Risk Level High fall risk Low fall risk Moderate fall risk  Moderate fall risk Moderate fall risk  Patient at Risk for Falls Due to - - - - History of fall(s);Impaired balance/gait;Impaired mobility  Fall risk Follow up - - - - Falls evaluation completed;Education provided;Falls prevention discussed   Functional Status Survey:    Vitals:   11/13/21 1048  BP: (!) 143/67  Pulse: 62  Resp: 16  Temp: (!) 97.5 F (36.4 C)  SpO2: 98%  Weight: 146 lb 11.2 oz (66.5 kg)  Height: 5\' 3"  (1.6 m)   Body mass index is 25.99 kg/m. Physical Exam  Labs reviewed: Recent Labs    02/14/21 0000 10/16/21 0000  NA 138 139  K 4.1 3.9  CL 105 104  CO2 24* 24*  BUN 21 16  CREATININE 0.8 0.8  CALCIUM 9.0 8.9   Recent Labs    02/14/21 0000 10/16/21 0000  AST  --  12*  ALT  --  11  ALKPHOS  --  37  ALBUMIN 3.7 3.6   Recent Labs    02/14/21 0000 10/16/21 0000  WBC 6.2  --   NEUTROABS 3,168.00 2,576.00  HGB 12.1 11.9*  HCT 37 35*  PLT 248 224   Lab Results  Component Value Date   TSH 1.14 10/16/2021   Lab Results  Component Value Date   HGBA1C 8.3 10/16/2021   Lab Results  Component Value Date   CHOL 131 10/16/2021   HDL 37 10/16/2021   LDLCALC 67 10/16/2021   TRIG 196 (A) 10/16/2021   CHOLHDL 4.5 08/20/2019    Significant Diagnostic Results in last 30 days:  No results found.  Assessment/Plan There are no diagnoses linked to this encounter.   Family/ staff Communication: ***  Labs/tests ordered:  ***

## 2021-11-13 NOTE — Progress Notes (Signed)
Location:  [facility] Room Number: N04 Place of Service:  SNF [phone]) Provider:  Hazle Nordmann, AGNP-C  Mahlon Gammon, MD  Patient Care Team: Mahlon Gammon, MD as PCP - General (Internal Medicine) [name], DO as Consulting Physician (Neurology)  Extended Emergency Contact Information Primary Emergency Contact: [name],[name] Address: [address]          [place] [address] [place] Home Phone: [phone] Relation: Sister Secondary Emergency Contact: [facility] Address: [address] [place], [place] [address] [name] Mobile Phone: [phone] Relation: Brother  Code Status:  DNR Goals of care: Advanced Directive information Advanced Directives 10/17/2021  Does Patient Have a Medical Advance Directive? Yes  Type of Estate agent of [place];Living will  Does patient want to make changes to medical advance directive? No - Patient declined  Copy of Healthcare Power of Attorney in Chart? Yes - validated most recent copy scanned in chart (See row information)  Would patient like information on creating a medical advance directive? -  Pre-existing out of facility DNR order (yellow form or pink MOST form) -     Chief Complaint  Patient presents with   Medical Management of Chronic Issues    Routine    HPI:  [name] seen today for medical management of chronic diseases.    She currently resides on the skilled nursing unit at [facility]. Past medical history includes: HTN, T2DM, constipation, neurocognitive disorder, osteoporosis, dyslipidemia, hx of lung nodule, lung nodule, and abnormal gait.   T2DM- A1c 6.9 (2021), 6.7 02/14/2021, 8.2 10/16/2021, no recent hypoglycemic events, blood sugars averaging 150-200, remains on metformin 500 mg twice daily, regular diet Neurocognitive disorder- needing more help with ADLs, cannot complete sentence today, nods head to  indicate "yes" HTN- BUN/creat 16/0.8 10/16/2021, off medications at this time  Osteoporosis- past use of Fosamax, family does not want further bone density tests, remains on calcium and vitamin D daily Lung nodule- 01/13/2021 2.5 mm nodule to right lung noted, 5 mm nodule to right shoulder also found by CT scan, watch/wait per ortho, CXR in future 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 No apparent injury.  Ambulates with wheelchair.   Recent blood pressures:  01/03- 143/67  12/27- 131/70  12/26- 108/64, 110/65  Recent weights:  01/04- 146.7 lbs  12/01- 153.8 lbs  11/09- 144.3 lbs      Past Medical History:  Diagnosis Date   Diabetes mellitus    Dyslipidemia    Hypertension    Osteoporosis    Urinary incontinence    No past surgical history on file.  Allergies  Allergen Reactions   Amoxicillin-Pot Clavulanate Hives   Meloxicam     Unknown reaction   Scallops [Shellfish Allergy] Other (See Comments)    "I don't feel so good after an hour"   Toviaz [Fesoterodine Fumarate Er]     Unknown reaction    Outpatient Encounter Medications as of 11/13/2021  Medication Sig   acetaminophen (TYLENOL) 325 MG tablet Take 650 mg by mouth 2 (two) times daily.   atorvastatin (LIPITOR) 40 MG tablet Take 1 tablet (  40 mg total) by mouth daily.   Calcium Carbonate-Vitamin D (CALTRATE 600+D PO) Take 1 tablet by mouth 2 (two) times daily.   clopidogrel (PLAVIX) 75 MG tablet Take 75 mg by mouth daily.   metFORMIN (GLUCOPHAGE) 500 MG tablet Take 500 mg by mouth 2 (two) times daily with a meal.    Multiple Vitamins-Minerals (CENTRUM SILVER PO) Take 1 tablet by mouth daily.   oxybutynin (DITROPAN-XL) 5 MG 24 hr tablet Take 5 mg by mouth at  bedtime.   sennosides-docusate sodium (SENOKOT-S) 8.6-50 MG tablet Take 1 tablet by mouth in the morning and at bedtime.   VITAMIN D PO Take 1 tablet by mouth daily. 1,000 units   zinc oxide 20 % ointment Apply 1 application topically as needed for irritation.   No facility-administered encounter medications on file as of 11/13/2021.    Review of Systems  Unable to perform ROS: Dementia   Immunization History  Administered Date(s) Administered   Influenza Inj Mdck Quad Pf 08/10/2019   Influenza-Unspecified 08/13/2020, 09/02/2021   Moderna SARS-COV2 Booster Vaccination 04/16/2021   Moderna Sars-Covid-2 Vaccination 12/11/2019, 01/08/2020   Unspecified SARS-COV-2 Vaccination 09/23/2020, 07/30/2021   Zoster, Live 07/01/2020   Pertinent  Health Maintenance Due  Topic Date Due   OPHTHALMOLOGY EXAM  Never done   URINE MICROALBUMIN  Never done   HEMOGLOBIN A1C  04/16/2022   FOOT EXAM  08/20/2022   INFLUENZA VACCINE  Completed   DEXA SCAN  Completed   Fall Risk 07/07/2020 07/08/2020 07/08/2020 07/10/2020 06/25/2021  Falls in the past year? - - - - 1  Was there an injury with Fall? - - - - 0  Fall Risk Category Calculator - - - - 1  Fall Risk Category - - - - Low  Patient Fall Risk Level High fall risk Low fall risk Moderate fall risk Moderate fall risk Moderate fall risk  Patient at Risk for Falls Due to - - - - History of fall(s);Impaired balance/gait;Impaired mobility  Fall risk Follow up - - - - Falls evaluation completed;Education provided;Falls prevention discussed   Functional Status Survey:    Vitals:   11/13/21 1048  BP: (!) 143/67  Pulse: 62  Resp: 16  Temp: (!) 97.5 F (36.4 C)  SpO2: 98%  Weight: 146 lb 11.2 oz (66.5 kg)  Height: 5\' 3"  (1.6 m)   Body mass index is 25.99 kg/m. Physical Exam Vitals reviewed.  Constitutional:      General: She is not in acute distress. HENT:     Head: Normocephalic.     Right Ear: There is no impacted cerumen.     Left Ear: There  is no impacted cerumen.     Nose: Nose normal.     Mouth/Throat:     Mouth: Mucous membranes are moist.  Eyes:     General:        Right eye: No discharge.        Left eye: No discharge.  Neck:     Vascular: No carotid bruit.  Cardiovascular:     Rate and Rhythm: Normal rate and regular rhythm.     Pulses: Normal pulses.     Heart sounds: Normal heart sounds. No murmur heard. Pulmonary:     Effort: Pulmonary effort is normal. No respiratory distress.     Breath sounds: Normal breath sounds. No wheezing.  Abdominal:     General: Bowel sounds are normal. There is no distension.     Palpations: Abdomen is soft.  Tenderness: There is no abdominal tenderness.  Musculoskeletal:     Cervical back: Normal range of motion.     Right lower leg: No edema.     Left lower leg: No edema.  Lymphadenopathy:     Cervical: No cervical adenopathy.  Skin:    General: Skin is warm and dry.     Capillary Refill: Capillary refill takes less than 2 seconds.  Neurological:     General: No focal deficit present.     Mental Status: She is alert. Mental status is at baseline.     Motor: Weakness present.     Gait: Gait abnormal.     Comments: Wheelchair  Psychiatric:        Mood and Affect: Mood normal.        Behavior: Behavior normal.        Cognition and Memory: Memory is impaired.     Comments: Cannot complete sentences, very pleasant, follows commands     Labs reviewed: Recent Labs    02/14/21 0000 10/16/21 0000  NA 138 139  K 4.1 3.9  CL 105 104  CO2 24* 24*  BUN 21 16  CREATININE 0.8 0.8  CALCIUM 9.0 8.9   Recent Labs    02/14/21 0000 10/16/21 0000  AST  --  12*  ALT  --  11  ALKPHOS  --  37  ALBUMIN 3.7 3.6   Recent Labs    02/14/21 0000 10/16/21 0000  WBC 6.2  --   NEUTROABS 3,168.00 2,576.00  HGB 12.1 11.9*  HCT 37 35*  PLT 248 224   Lab Results  Component Value Date   TSH 1.14 10/16/2021   Lab Results  Component Value Date   HGBA1C 8.3 10/16/2021    Lab Results  Component Value Date   CHOL 131 10/16/2021   HDL 37 10/16/2021   LDLCALC 67 10/16/2021   TRIG 196 (A) 10/16/2021   CHOLHDL 4.5 08/20/2019    Significant Diagnostic Results in last 30 days:  No results found.  Assessment/Plan 1. Type 2 diabetes mellitus with other neurologic complication, without long-term current use of insulin (HCC) - A1c 8.3 10/16/2021 - blood sugars averaging 150-200- no hypoglycemia - cont Metformin - A1c- future  2. Neurocognitive disorder - ambulates win wheelchair, trouble finishing sentences - requiring more assistance with ADLs - cont skilled nursing care  3. Primary hypertension - controlled without medication  4. Age related osteoporosis, unspecified pathological fracture presence - no further work up per goals of care - cont calcium and vitamin D supplement  5. Lung nodule - CXR in facility in a few month  6. Ataxia - cont skilled nursing care - cont falls safety precautions  7. Hyperlipidemia associated with type 2 diabetes mellitus (HCC) - LDL stable - cont statin  8. History of TIA (transient ischemic attack) - cont Plavix and statin  9. Urinary incontinence, unspecified type - stable with Ditropan  10. Weight loss - down > 5 lbs in one month - weight appears to be fluctuating  - cont monthly weights    Family/ staff Communication: plan discussed with patient and nurse  Labs/tests ordered:  none

## 2021-12-11 ENCOUNTER — Encounter: Payer: Self-pay | Admitting: Adult Health

## 2021-12-11 ENCOUNTER — Non-Acute Institutional Stay (SKILLED_NURSING_FACILITY): Payer: Medicare Other | Admitting: Internal Medicine

## 2021-12-11 DIAGNOSIS — R32 Unspecified urinary incontinence: Secondary | ICD-10-CM

## 2021-12-11 DIAGNOSIS — R911 Solitary pulmonary nodule: Secondary | ICD-10-CM

## 2021-12-11 DIAGNOSIS — Z8673 Personal history of transient ischemic attack (TIA), and cerebral infarction without residual deficits: Secondary | ICD-10-CM

## 2021-12-11 DIAGNOSIS — M810 Age-related osteoporosis without current pathological fracture: Secondary | ICD-10-CM | POA: Diagnosis not present

## 2021-12-11 DIAGNOSIS — E1149 Type 2 diabetes mellitus with other diabetic neurological complication: Secondary | ICD-10-CM

## 2021-12-11 DIAGNOSIS — R419 Unspecified symptoms and signs involving cognitive functions and awareness: Secondary | ICD-10-CM | POA: Diagnosis not present

## 2021-12-11 DIAGNOSIS — E1169 Type 2 diabetes mellitus with other specified complication: Secondary | ICD-10-CM

## 2021-12-11 DIAGNOSIS — I10 Essential (primary) hypertension: Secondary | ICD-10-CM | POA: Diagnosis not present

## 2021-12-11 DIAGNOSIS — E785 Hyperlipidemia, unspecified: Secondary | ICD-10-CM

## 2021-12-11 DIAGNOSIS — R270 Ataxia, unspecified: Secondary | ICD-10-CM

## 2021-12-11 NOTE — Progress Notes (Signed)
Location:   [place] Room Number: 4 Place of Service:  SNF [phone]) Provider:  Veleta Miners MD   [name], MD  Patient Care Team: [name], MD as PCP - General (Internal Medicine) [name], DO as Consulting Physician (Neurology)  Extended Emergency Contact Information Primary Emergency Contact: [name],[name] Address: [address]          [place] [address] [place] Phone: [phone] Relation: Sister Secondary Emergency Contact: [facility] Address: [address] [place], [place] [address] [name] of [place] Phone: [phone] Relation: Brother  Code Status:  [facility] Care Goals of care: Advanced Directive information Advanced Directives 12/11/2021  Does Patient Have a Medical Advance Directive? Yes  Type of Paramedic of [place];Living will;Out of facility DNR (pink MOST or yellow form)  Does patient want to make changes to medical advance directive? No - Patient declined  Copy of [place] in Chart? Yes - validated most recent copy scanned in chart (See row information)  Would patient like information on creating a medical advance directive? -  Pre-existing out of facility DNR order (yellow form or pink MOST form) Pink MOST/Yellow Form most recent copy in chart - Physician notified to receive inpatient order     Chief Complaint  Patient presents with   Medical Management of Chronic Issues   Quality Metric Gaps    Pneumonia Vaccine 36+ Years old (1 - PCV)   URINE MICROALBUMIN (Yearly) Hepatitis C Screening (Once) TETANUS/TDAP (Every 10 Years) Zoster Vaccines- Shingrix (1 of 2)     Error    HPI:  [name] seen today for medical management of chronic diseases.     Past Medical History:  Diagnosis Date   Diabetes mellitus    Dyslipidemia    Hypertension    Osteoporosis    Urinary incontinence    History  reviewed. No pertinent surgical history.  Allergies  Allergen Reactions   Amoxicillin-Pot Clavulanate Hives   Meloxicam     Unknown reaction   Scallops [Shellfish Allergy] Other (See Comments)    "I don't feel so good after an hour"   Toviaz [Fesoterodine Fumarate Er]     Unknown reaction    Allergies as of 12/11/2021      Reactions   Amoxicillin-pot Clavulanate Hives   Meloxicam    Unknown reaction   Scallops [shellfish Allergy] Other (See Comments)   "I don't feel so good after an hour"   Toviaz [fesoterodine Fumarate Er]    Unknown reaction      Medication List       Accurate as of December 11, 2021 11:59 PM. If you have any questions, ask your nurse or doctor.        acetaminophen 325 MG tablet Commonly known as: TYLENOL Take 650 mg by mouth 2 (two) times daily.   atorvastatin 40 MG tablet Commonly known as: LIPITOR Take 1 tablet (40 mg total) by mouth daily.   CALTRATE 600+D PO Take 1 tablet by mouth 2 (two) times daily.   CENTRUM SILVER PO Take 1 tablet by mouth daily.   clopidogrel 75 MG tablet Commonly known as: PLAVIX Take 75 mg by mouth daily.   metFORMIN 500 MG tablet Commonly known as: GLUCOPHAGE Take 500 mg by mouth daily.   metFORMIN 500 MG tablet Commonly known as: GLUCOPHAGE Take 750 mg by  mouth every morning.   oxybutynin 5 MG 24 hr tablet Commonly known as: DITROPAN-XL Take 5 mg by mouth at bedtime.   sennosides-docusate sodium 8.6-50 MG tablet Commonly known as: SENOKOT-S Take 1 tablet by mouth in the morning and at bedtime.   VITAMIN D PO Take 1 tablet by mouth daily. 1,000 units   zinc oxide 20 % ointment Apply 1 application topically as needed for irritation.       Review of Systems  Immunization History  Administered Date(s) Administered   Influenza Inj Mdck Quad Pf 08/10/2019   Influenza-Unspecified 08/13/2020, 09/02/2021   Moderna SARS-COV2 Booster Vaccination 04/16/2021   Moderna Sars-Covid-2 Vaccination  12/11/2019, 01/08/2020   Unspecified SARS-COV-2 Vaccination 09/23/2020, 07/30/2021   Zoster Recombinat (Shingrix) 07/01/2020   Zoster, Live 07/01/2020   Pertinent  Health Maintenance Due  Topic Date Due   URINE MICROALBUMIN  Never done   HEMOGLOBIN A1C  04/16/2022   FOOT EXAM  08/20/2022   OPHTHALMOLOGY EXAM  12/04/2022   INFLUENZA VACCINE  Completed   DEXA SCAN  Completed   Fall Risk 07/07/2020 07/08/2020 07/08/2020 07/10/2020 06/25/2021  Falls in the past year? - - - - 1  Was there an injury with Fall? - - - - 0  Fall Risk Category Calculator - - - - 1  Fall Risk Category - - - - Low  Patient Fall Risk Level High fall risk Low fall risk Moderate fall risk Moderate fall risk Moderate fall risk  Patient at Risk for Falls Due to - - - - History of fall(s);Impaired balance/gait;Impaired mobility  Fall risk Follow up - - - - Falls evaluation completed;Education provided;Falls prevention discussed   Functional Status Survey:    Vitals:   12/11/21 1425  BP: 134/69  Pulse: 77  Resp: 20  Temp: (!) 97.5 F (36.4 C)  SpO2: 96%  Weight: 146 lb 11.2 oz (66.5 kg)  Height: 5\' 3"  (1.6 m)   Body mass index is 25.99 kg/m. Physical Exam  Labs reviewed: Recent Labs    02/14/21 0000 10/16/21 0000  NA 138 139  K 4.1 3.9  CL 105 104  CO2 24* 24*  BUN 21 16  CREATININE 0.8 0.8  CALCIUM 9.0 8.9    Recent Labs    02/14/21 0000 10/16/21 0000  AST  --  12*  ALT  --  11  ALKPHOS  --  37  ALBUMIN 3.7 3.6    Recent Labs    02/14/21 0000 10/16/21 0000  WBC 6.2  --   NEUTROABS 3,168.00 2,576.00  HGB 12.1 11.9*  HCT 37 35*  PLT 248 224    Lab Results  Component Value Date   TSH 1.14 10/16/2021   Lab Results  Component Value Date   HGBA1C 8.3 10/16/2021   Lab Results  Component Value Date   CHOL 131 10/16/2021   HDL 37 10/16/2021   LDLCALC 67 10/16/2021   TRIG 196 (A) 10/16/2021   CHOLHDL 4.5 08/20/2019    Significant Diagnostic Results in last 30 days:   No results found.  Assessment/Plan There are no diagnoses linked to this encounter.   Family/ staff Communication:   Labs/tests ordered:      This encounter was created in error - please disregard.

## 2021-12-12 ENCOUNTER — Encounter: Payer: Self-pay | Admitting: Internal Medicine

## 2021-12-12 NOTE — Progress Notes (Signed)
Location:   [facility] Room Number: 4 Place of Service:  SNF [phone]) Provider:  Einar Crow MD  Mahlon Gammon, MD  Patient Care Team: Mahlon Gammon, MD as PCP - General (Internal Medicine) [name], DO as Consulting Physician (Neurology)  Extended Emergency Contact Information Primary Emergency Contact: [name],[name] Address: [address]          [place] [address] [place] Home Phone: [phone] Relation: Sister Secondary Emergency Contact: [facility] Address: [address] [place], [place] [address] [name] Mobile Phone: [phone] Relation: Brother  Code Status:  DNR Managed Care Goals of care: Advanced Directive information Advanced Directives 12/12/2021  Does Patient Have a Medical Advance Directive? Yes  Type of Estate agent of [place];Living will;Out of facility DNR (pink MOST or yellow form)  Does patient want to make changes to medical advance directive? No - Patient declined  Copy of Healthcare Power of Attorney in Chart? Yes - validated most recent copy scanned in chart (See row information)  Would patient like information on creating a medical advance directive? -  Pre-existing out of facility DNR order (yellow form or pink MOST form) Pink MOST/Yellow Form most recent copy in chart - Physician notified to receive inpatient order     Chief Complaint  Patient presents with   Medical Management of Chronic Issues   Quality Metric Gaps    Pneumonia Vaccine 63+ Years old (1 - PCV)   URINE MICROALBUMIN (Yearly)  Hepatitis C Screening (Once)  TETANUS/TDAP (Every 10 Years)  Zoster Vaccines- Shingrix (1 of 2)    HPI:  [name] seen today for medical management of chronic diseases.    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 No new Nursing issues. No Behavior issues Continues to need help with her ADLS Now Wheelchair Dependent Cant do her transfers either No Falls Wt Readings from Last 3 Encounters:  12/12/21 146 lb 11.2 oz (66.5 kg)  12/11/21 146 lb 11.2 oz (66.5 kg)  11/13/21 146 lb 11.2 oz (66.5 kg)    Past Medical History:  Diagnosis Date   Diabetes mellitus    Dyslipidemia    Hypertension    Osteoporosis    Urinary incontinence    No past surgical history on file.  Allergies  Allergen Reactions   Amoxicillin-Pot Clavulanate Hives   Meloxicam     Unknown reaction   Scallops [Shellfish Allergy] Other (See Comments)    "I don't feel so good after an hour"   Toviaz [Fesoterodine Fumarate Er]     Unknown reaction    Allergies as of 12/11/2021       Reactions   Amoxicillin-pot Clavulanate Hives   Meloxicam    Unknown reaction   Scallops [shellfish Allergy] Other (See Comments)   "I don't feel so good after an hour"   Toviaz [fesoterodine Fumarate Er]    Unknown reaction        Medication List        Accurate as of December 11, 2021 11:59 PM. If you have any questions, ask your nurse or doctor.          acetaminophen 325 MG tablet Commonly known as: TYLENOL Take 650 mg by mouth  2 (two) times daily.   atorvastatin 40 MG tablet Commonly known as: LIPITOR Take 1 tablet (40 mg total) by mouth daily.   CALTRATE 600+D PO Take 1 tablet by mouth 2 (two) times daily.   CENTRUM SILVER PO Take 1 tablet by mouth daily.   clopidogrel 75 MG tablet Commonly known as: PLAVIX Take 75 mg by mouth daily.   metFORMIN 500 MG tablet Commonly known as: GLUCOPHAGE Take 500 mg by mouth daily.   metFORMIN 500 MG tablet Commonly known as: GLUCOPHAGE Take 750 mg by mouth every morning.   oxybutynin 5 MG 24 hr tablet Commonly known as: DITROPAN-XL Take 5 mg by mouth at bedtime.   sennosides-docusate sodium 8.6-50 MG tablet Commonly known  as: SENOKOT-S Take 1 tablet by mouth in the morning and at bedtime.   VITAMIN D PO Take 1 tablet by mouth daily. 1,000 units   zinc oxide 20 % ointment Apply 1 application topically as needed for irritation.        Review of Systems  Constitutional:  Negative for activity change and appetite change.  HENT: Negative.    Respiratory:  Negative for cough and shortness of breath.   Cardiovascular:  Negative for leg swelling.  Gastrointestinal:  Negative for constipation.  Genitourinary: Negative.   Musculoskeletal:  Positive for gait problem. Negative for arthralgias and myalgias.  Skin: Negative.   Neurological:  Negative for dizziness and weakness.  Psychiatric/Behavioral:  Positive for confusion. Negative for dysphoric mood and sleep disturbance.    Immunization History  Administered Date(s) Administered   Influenza Inj Mdck Quad Pf 08/10/2019   Influenza-Unspecified 08/13/2020, 09/02/2021   Moderna SARS-COV2 Booster Vaccination 04/16/2021   Moderna Sars-Covid-2 Vaccination 12/11/2019, 01/08/2020   Unspecified SARS-COV-2 Vaccination 09/23/2020, 07/30/2021   Zoster Recombinat (Shingrix) 07/01/2020   Zoster, Live 07/01/2020   Pertinent  Health Maintenance Due  Topic Date Due   URINE MICROALBUMIN  Never done   HEMOGLOBIN A1C  04/16/2022   FOOT EXAM  08/20/2022   OPHTHALMOLOGY EXAM  12/04/2022   INFLUENZA VACCINE  Completed   DEXA SCAN  Completed   Fall Risk 07/07/2020 07/08/2020 07/08/2020 07/10/2020 06/25/2021  Falls in the past year? - - - - 1  Was there an injury with Fall? - - - - 0  Fall Risk Category Calculator - - - - 1  Fall Risk Category - - - - Low  Patient Fall Risk Level High fall risk Low fall risk Moderate fall risk Moderate fall risk Moderate fall risk  Patient at Risk for Falls Due to - - - - History of fall(s);Impaired balance/gait;Impaired mobility  Fall risk Follow up - - - - Falls evaluation completed;Education provided;Falls prevention discussed    Functional Status Survey:    Vitals:   12/12/21 0837  BP: 134/69  Pulse: 77  Resp: 20  Temp: (!) 97.5 F (36.4 C)  SpO2: 96%  Weight: 146 lb 11.2 oz (66.5 kg)  Height: 5\' 3"  (1.6 m)   Body mass index is 25.99 kg/m. Physical Exam Vitals reviewed.  Constitutional:      Appearance: Normal appearance.  HENT:     Head: Normocephalic.     Nose: Nose normal.     Mouth/Throat:     Mouth: Mucous membranes are moist.     Pharynx: Oropharynx is clear.  Eyes:     Pupils: Pupils are equal, round, and reactive to light.  Cardiovascular:     Rate and Rhythm: Normal rate and regular rhythm.  Pulses: Normal pulses.     Heart sounds: Normal heart sounds. No murmur heard. Pulmonary:     Effort: Pulmonary effort is normal.     Breath sounds: Normal breath sounds.  Abdominal:     General: Abdomen is flat. Bowel sounds are normal.     Palpations: Abdomen is soft.  Musculoskeletal:        General: No swelling.     Cervical back: Neck supple.  Skin:    General: Skin is warm.  Neurological:     General: No focal deficit present.     Mental Status: She is alert.  Psychiatric:        Mood and Affect: Mood normal.        Thought Content: Thought content normal.    Labs reviewed: Recent Labs    02/14/21 0000 10/16/21 0000  NA 138 139  K 4.1 3.9  CL 105 104  CO2 24* 24*  BUN 21 16  CREATININE 0.8 0.8  CALCIUM 9.0 8.9   Recent Labs    02/14/21 0000 10/16/21 0000  AST  --  12*  ALT  --  11  ALKPHOS  --  37  ALBUMIN 3.7 3.6   Recent Labs    02/14/21 0000 10/16/21 0000  WBC 6.2  --   NEUTROABS 3,168.00 2,576.00  HGB 12.1 11.9*  HCT 37 35*  PLT 248 224   Lab Results  Component Value Date   TSH 1.14 10/16/2021   Lab Results  Component Value Date   HGBA1C 8.3 10/16/2021   Lab Results  Component Value Date   CHOL 131 10/16/2021   HDL 37 10/16/2021   LDLCALC 67 10/16/2021   TRIG 196 (A) 10/16/2021   CHOLHDL 4.5 08/20/2019    Significant Diagnostic  Results in last 30 days:  No results found.  Assessment/Plan 1. Type 2 diabetes mellitus with other neurologic complication, without long-term current use of insulin (HCC) A1C high 8.3 Went up in metformin Need more increase 1000 mg BID Also Check CBGS again to follow Off ACE due to low BP 2. Neurocognitive disorder Further decline Now in SNF 3 History of TIA (transient ischemic attack) On Plavix and Statin  4. Age related osteoporosis, unspecified pathological fracture presence Was on Fosamax Family did not want Follow up DEXA   5. Lung nodule Follow up XRAY showed Benign Nodule Repeat in Few months  6. Ataxia   7. Urinary incontinence, unspecified type Ditropan  8. Hyperlipidemia associated with type 2 diabetes mellitus (HCC) Statin LDL 67     Family/ staff Communication:   Labs/tests ordered:

## 2022-01-07 ENCOUNTER — Encounter: Payer: Self-pay | Admitting: Orthopedic Surgery

## 2022-01-07 ENCOUNTER — Non-Acute Institutional Stay (SKILLED_NURSING_FACILITY): Payer: Medicare Other | Admitting: Orthopedic Surgery

## 2022-01-07 DIAGNOSIS — E785 Hyperlipidemia, unspecified: Secondary | ICD-10-CM

## 2022-01-07 DIAGNOSIS — E1149 Type 2 diabetes mellitus with other diabetic neurological complication: Secondary | ICD-10-CM

## 2022-01-07 DIAGNOSIS — R634 Abnormal weight loss: Secondary | ICD-10-CM

## 2022-01-07 DIAGNOSIS — R911 Solitary pulmonary nodule: Secondary | ICD-10-CM

## 2022-01-07 DIAGNOSIS — R32 Unspecified urinary incontinence: Secondary | ICD-10-CM

## 2022-01-07 DIAGNOSIS — R270 Ataxia, unspecified: Secondary | ICD-10-CM

## 2022-01-07 DIAGNOSIS — I10 Essential (primary) hypertension: Secondary | ICD-10-CM | POA: Diagnosis not present

## 2022-01-07 DIAGNOSIS — E1169 Type 2 diabetes mellitus with other specified complication: Secondary | ICD-10-CM

## 2022-01-07 DIAGNOSIS — R419 Unspecified symptoms and signs involving cognitive functions and awareness: Secondary | ICD-10-CM

## 2022-01-07 DIAGNOSIS — Z8673 Personal history of transient ischemic attack (TIA), and cerebral infarction without residual deficits: Secondary | ICD-10-CM

## 2022-01-07 DIAGNOSIS — M810 Age-related osteoporosis without current pathological fracture: Secondary | ICD-10-CM

## 2022-01-07 NOTE — Progress Notes (Signed)
Location:  [facility] Room Number: N04/A Place of Service:  SNF [phone]) Provider: Octavia Heir, NP  Patient Care Team: Mahlon Gammon, MD as PCP - General (Internal Medicine) [name], DO as Consulting Physician (Neurology)  Extended Emergency Contact Information Primary Emergency Contact: [name],[name] Address: [address]          [place] [address] [place] Home Phone: [phone] Relation: Sister Secondary Emergency Contact: [facility] Address: [address] [place], [place] [address] [name] Mobile Phone: [phone] Relation: Brother  Code Status:  DNR Goals of care: Advanced Directive information Advanced Directives 01/07/2022  Does Patient Have a Medical Advance Directive? Yes  Type of Estate agent of [place];Living will;Out of facility DNR (pink MOST or yellow form)  Does patient want to make changes to medical advance directive? No - Patient declined  Copy of Healthcare Power of Attorney in Chart? Yes - validated most recent copy scanned in chart (See row information)  Would patient like information on creating a medical advance directive? -  Pre-existing out of facility DNR order (yellow form or pink MOST form) Pink MOST/Yellow Form most recent copy in chart - Physician notified to receive inpatient order     Chief Complaint  Patient presents with   Medical Management of Chronic Issues    Routine visit. Discuss the need for urine micoalbumin, Hep C screening, TDAP, Shingrix, and Pneumonia vaccine, or post pone if refuses.     HPI:  [name] seen today for medical management of chronic diseases.    She currently resides on the skilled nursing unit at [facility]. Past medical history includes: HTN, T2DM, constipation, neurocognitive disorder, osteoporosis, dyslipidemia, hx of lung nodule, lung nodule, and abnormal gait.   T2DM- A1c 6.9 (2021), 6.7  02/14/2021, 8.2 10/16/2021, no recent hypoglycemic events, blood sugars averaging 140-180's, remains on metformin 500 mg twice daily, regular diet Neurocognitive disorder- BIMS 8 12/09/2021, needing more help with ADLs, no behavioral outbursts, very pleasant today, gave short responses to questions Weight loss- see weights below HTN- BUN/creat 16/0.8 10/16/2021, off medications at this time  Osteoporosis- past use of Fosamax, family does not want further bone density tests, remains on calcium and vitamin D daily Lung nodule- 01/13/2021 2.5 mm nodule to right lung noted, 5 mm nodule to right shoulder also found by CT scan, watch/wait per ortho, CXR in future 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   Recent blood pressures;  02/28- 113/64  02/21- 108/64  02/14- 122/60  Recent weights:  02/05- 144.2 lbs  01/04- 146.7 lbs  12/01- 153.8 lbs      Past Medical History:  Diagnosis Date   Diabetes mellitus    Dyslipidemia    Hypertension    Osteoporosis    Urinary incontinence    History reviewed. No pertinent surgical history.  Allergies  Allergen Reactions   Amoxicillin-Pot Clavulanate Hives   Meloxicam     Unknown reaction   Scallops [Shellfish Allergy] Other (See Comments)    "I don't feel so good after an hour"   Toviaz [Fesoterodine Fumarate Er]     Unknown reaction    Outpatient Encounter Medications as of 01/07/2022  Medication Sig   acetaminophen (  TYLENOL) 325 MG tablet Take 650 mg by mouth 2 (two) times daily.   atorvastatin (LIPITOR) 40 MG tablet Take 1 tablet (40 mg total) by mouth daily.   Calcium Carbonate-Vitamin D (CALTRATE 600+D PO) Take 1 tablet by mouth 2 (two) times daily.   cholecalciferol (VITAMIN D3) 25 MCG (1000 UNIT) tablet Take 1,000 Units by mouth daily.   clopidogrel  (PLAVIX) 75 MG tablet Take 75 mg by mouth daily.   metFORMIN (GLUCOPHAGE) 500 MG tablet Take 750 mg by mouth every morning.   Multiple Vitamins-Minerals (CENTRUM SILVER PO) Take 1 tablet by mouth daily.   oxybutynin (DITROPAN-XL) 5 MG 24 hr tablet Take 5 mg by mouth at bedtime.   sennosides-docusate sodium (SENOKOT-S) 8.6-50 MG tablet Take 1 tablet by mouth in the morning and at bedtime.   zinc oxide 20 % ointment Apply 1 application topically as needed for irritation.   [DISCONTINUED] metFORMIN (GLUCOPHAGE) 500 MG tablet Take 500 mg by mouth daily. (Patient not taking: Reported on 01/07/2022)   [DISCONTINUED] VITAMIN D PO Take 1 tablet by mouth daily. 1,000 units   No facility-administered encounter medications on file as of 01/07/2022.    Review of Systems  Unable to perform ROS: Dementia   Immunization History  Administered Date(s) Administered   Influenza Inj Mdck Quad Pf 08/10/2019   Influenza-Unspecified 08/13/2020, 09/02/2021   Moderna SARS-COV2 Booster Vaccination 04/16/2021   Moderna Sars-Covid-2 Vaccination 12/11/2019, 01/08/2020   Unspecified SARS-COV-2 Vaccination 09/23/2020, 07/30/2021   Zoster Recombinat (Shingrix) 07/01/2020   Zoster, Live 07/01/2020   Pertinent  Health Maintenance Due  Topic Date Due   URINE MICROALBUMIN  Never done   HEMOGLOBIN A1C  04/16/2022   FOOT EXAM  08/20/2022   OPHTHALMOLOGY EXAM  12/04/2022   INFLUENZA VACCINE  Completed   DEXA SCAN  Completed   Fall Risk 07/07/2020 07/08/2020 07/08/2020 07/10/2020 06/25/2021  Falls in the past year? - - - - 1  Was there an injury with Fall? - - - - 0  Fall Risk Category Calculator - - - - 1  Fall Risk Category - - - - Low  Patient Fall Risk Level High fall risk Low fall risk Moderate fall risk Moderate fall risk Moderate fall risk  Patient at Risk for Falls Due to - - - - History of fall(s);Impaired balance/gait;Impaired mobility  Fall risk Follow up - - - - Falls evaluation completed;Education provided;Falls  prevention discussed   Functional Status Survey:    Vitals:   01/07/22 0907  BP: 113/64  Pulse: 81  Resp: 20  Temp: (!) 96.9 F (36.1 C)  SpO2: 98%  Weight: 144 lb 3.2 oz (65.4 kg)  Height: 5\' 3"  (1.6 m)   Body mass index is 25.54 kg/m. Physical Exam Vitals reviewed.  Constitutional:      General: She is not in acute distress. HENT:     Head: Normocephalic.     Right Ear: There is no impacted cerumen.     Left Ear: There is no impacted cerumen.     Nose: Nose normal.     Mouth/Throat:     Mouth: Mucous membranes are moist.  Eyes:     General:        Right eye: No discharge.        Left eye: No discharge.     Comments: glasses  Neck:     Vascular: No carotid bruit.  Cardiovascular:     Rate and Rhythm: Normal rate.     Pulses: Normal pulses.  Heart sounds: Normal heart sounds.  Pulmonary:     Effort: Pulmonary effort is normal. No respiratory distress.     Breath sounds: Normal breath sounds. No wheezing.  Abdominal:     General: Bowel sounds are normal. There is no distension.     Palpations: Abdomen is soft.     Tenderness: There is no abdominal tenderness.  Musculoskeletal:     Cervical back: Neck supple.     Right lower leg: No edema.     Left lower leg: No edema.  Lymphadenopathy:     Cervical: No cervical adenopathy.  Skin:    General: Skin is warm and dry.     Capillary Refill: Capillary refill takes less than 2 seconds.  Neurological:     General: No focal deficit present.     Mental Status: She is alert. Mental status is at baseline.     Motor: Weakness present.     Gait: Gait abnormal.     Comments: wheelchair  Psychiatric:        Mood and Affect: Mood normal.        Behavior: Behavior normal.     Comments: Very pleasant, follows commands, alert to self and familiar faces    Labs reviewed: Recent Labs    02/14/21 0000 10/16/21 0000  NA 138 139  K 4.1 3.9  CL 105 104  CO2 24* 24*  BUN 21 16  CREATININE 0.8 0.8  CALCIUM 9.0 8.9    Recent Labs    02/14/21 0000 10/16/21 0000  AST  --  12*  ALT  --  11  ALKPHOS  --  37  ALBUMIN 3.7 3.6   Recent Labs    02/14/21 0000 10/16/21 0000  WBC 6.2  --   NEUTROABS 3,168.00 2,576.00  HGB 12.1 11.9*  HCT 37 35*  PLT 248 224   Lab Results  Component Value Date   TSH 1.14 10/16/2021   Lab Results  Component Value Date   HGBA1C 8.3 10/16/2021   Lab Results  Component Value Date   CHOL 131 10/16/2021   HDL 37 10/16/2021   LDLCALC 67 10/16/2021   TRIG 196 (A) 10/16/2021   CHOLHDL 4.5 08/20/2019    Significant Diagnostic Results in last 30 days:  No results found.  Assessment/Plan 1. Type 2 diabetes mellitus with other neurologic complication, without long-term current use of insulin (HCC) - A1c stable - cont metformin - urine microalbumin  2. Neurocognitive disorder - no behavioral outbursts - doing well in SNF  3. Weight loss - trending down - clothes appear looser - cont milkshakes twice weekly - cont monthly weights  4. Primary hypertension - controlled without medication  5. Age related osteoporosis, unspecified pathological fracture presence - no further studies per goals of care - cont calcium/vit d supplement  6. Lung nodule - last x ray indicated benign nodules per Dr. Chales Abrahams - CXR in a few months  7. Ataxia - ambulates with w/c - cont skilled nursing care  8. Hyperlipidemia associated with type 2 diabetes mellitus (HCC) - LDL stable - cont Lipitor  9. History of TIA (transient ischemic attack) - cont Plavix and statin  10. Urinary incontinence, unspecified type - stable with ditropan    Family/ staff Communication: plan discussed with patient and nurse  Labs/tests ordered:  urine microalbumin, tetanus vaccine, prevnar 20 vaccine

## 2022-01-10 DIAGNOSIS — E119 Type 2 diabetes mellitus without complications: Secondary | ICD-10-CM | POA: Diagnosis not present

## 2022-01-15 DIAGNOSIS — E119 Type 2 diabetes mellitus without complications: Secondary | ICD-10-CM | POA: Diagnosis not present

## 2022-01-15 LAB — HEMOGLOBIN A1C: Hemoglobin A1C: 7.1

## 2022-02-09 ENCOUNTER — Encounter: Payer: Self-pay | Admitting: Orthopedic Surgery

## 2022-02-09 ENCOUNTER — Non-Acute Institutional Stay (SKILLED_NURSING_FACILITY): Payer: Medicare Other | Admitting: Orthopedic Surgery

## 2022-02-09 DIAGNOSIS — N3000 Acute cystitis without hematuria: Secondary | ICD-10-CM | POA: Diagnosis not present

## 2022-02-09 DIAGNOSIS — R419 Unspecified symptoms and signs involving cognitive functions and awareness: Secondary | ICD-10-CM | POA: Diagnosis not present

## 2022-02-09 DIAGNOSIS — R32 Unspecified urinary incontinence: Secondary | ICD-10-CM

## 2022-02-09 DIAGNOSIS — R270 Ataxia, unspecified: Secondary | ICD-10-CM

## 2022-02-09 DIAGNOSIS — E1149 Type 2 diabetes mellitus with other diabetic neurological complication: Secondary | ICD-10-CM | POA: Diagnosis not present

## 2022-02-09 DIAGNOSIS — R400 Somnolence: Secondary | ICD-10-CM

## 2022-02-09 DIAGNOSIS — M6281 Muscle weakness (generalized): Secondary | ICD-10-CM | POA: Diagnosis not present

## 2022-02-09 DIAGNOSIS — I10 Essential (primary) hypertension: Secondary | ICD-10-CM | POA: Diagnosis not present

## 2022-02-09 DIAGNOSIS — E1169 Type 2 diabetes mellitus with other specified complication: Secondary | ICD-10-CM

## 2022-02-09 DIAGNOSIS — E785 Hyperlipidemia, unspecified: Secondary | ICD-10-CM

## 2022-02-09 DIAGNOSIS — R6889 Other general symptoms and signs: Secondary | ICD-10-CM | POA: Diagnosis not present

## 2022-02-09 DIAGNOSIS — R911 Solitary pulmonary nodule: Secondary | ICD-10-CM

## 2022-02-09 DIAGNOSIS — Z8673 Personal history of transient ischemic attack (TIA), and cerebral infarction without residual deficits: Secondary | ICD-10-CM

## 2022-02-09 DIAGNOSIS — M810 Age-related osteoporosis without current pathological fracture: Secondary | ICD-10-CM

## 2022-02-09 LAB — CBC AND DIFFERENTIAL
HCT: 38 (ref 36–46)
Hemoglobin: 12.8 (ref 12.0–16.0)
Neutrophils Absolute: 5434
Platelets: 204 10*3/uL (ref 150–400)
WBC: 6.5

## 2022-02-09 LAB — BASIC METABOLIC PANEL
BUN: 16 (ref 4–21)
CO2: 27 — AB (ref 13–22)
Chloride: 100 (ref 99–108)
Creatinine: 0.8 (ref 0.5–1.1)
Glucose: 169
Potassium: 3.9 mEq/L (ref 3.5–5.1)
Sodium: 136 — AB (ref 137–147)

## 2022-02-09 LAB — HEPATIC FUNCTION PANEL
ALT: 18 U/L (ref 7–35)
AST: 20 (ref 13–35)
Alkaline Phosphatase: 49 (ref 25–125)
Bilirubin, Total: 0.8

## 2022-02-09 LAB — COMPREHENSIVE METABOLIC PANEL
Albumin: 4 (ref 3.5–5.0)
Calcium: 9.1 (ref 8.7–10.7)
Globulin: 2.2
eGFR: 71

## 2022-02-09 LAB — CBC: RBC: 4.1 (ref 3.87–5.11)

## 2022-02-09 NOTE — Progress Notes (Signed)
?Location:  [facility] ?Nursing Home Room Number: N04/A ?Place of Service:  SNF (31) ?Provider: Octavia Heir, NP ? ?Patient Care Team: ?Jaclyn Gammon, MD as PCP - General (Internal Medicine) ?Jaclyn Dallas, DO as Consulting Physician (Neurology) ? ?Extended Emergency Contact Information ?Primary Emergency Contact: Jaclyn Guzman ?Address: [address] ?         [place] [address] [place] ?Home Phone: [phone] ?Relation: Sister ?Secondary Emergency Contact: Jaclyn Guzman ?Address: [address] [place] [address] ?         [place], [place] [address] [place] ?Mobile Phone: [phone] ?Relation: Brother ? ?Code Status: DNR ?Goals of care: Advanced Directive information ? ?  02/09/2022  ?  9:48 AM  ?Advanced Directives  ?Does Patient Have a Medical Advance Directive? Yes  ?Type of Estate agent of [place];Living will;Out of facility DNR (pink MOST or yellow form)  ?Does patient want to make changes to medical advance directive? No - Patient declined  ?Copy of Healthcare Power of Attorney in Chart? Yes - validated most recent copy scanned in chart (See row information)  ?Pre-existing out of facility DNR order (yellow form or pink MOST form) Pink MOST/Yellow Form most recent copy in chart - Physician notified to receive inpatient order  ? ? ? ?Chief Complaint  ?Patient presents with  ? Acute Visit  ?  Weakness  ? ? ?HPI:  ?[name] seen today for an acute visit for weakness.  ? ?04/02 staff reports she did not get out of bed and slept most of day. Today, she is a 2 person assist into wheelchair. Appetite also poor, consuming < 25% of meals x 2 days. She is non verbal during encounter. Follows commands. Shakes her head "no" when asked about pain. Able to take a few sips of water with me. Afebrile. Vitals stable. No recent falls or injuries reported.  ? 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 ? ?Past Medical History:  ?Diagnosis Date  ? Diabetes mellitus   ? Dyslipidemia   ? Hypertension   ? Osteoporosis   ? Urinary incontinence   ? ?History reviewed. No pertinent surgical history. ? ?Allergies  ?Allergen Reactions  ? Amoxicillin-Pot Clavulanate Hives  ? Meloxicam   ?  Unknown reaction  ? Scallops [Shellfish Allergy] Other (See Comments)  ?  "I don't feel so good after an hour"  ? Toviaz [Fesoterodine Fumarate Er]   ?  Unknown reaction  ? ? ?Outpatient Encounter Medications as of 02/09/2022  ?Medication Sig  ? acetaminophen (TYLENOL) 325 MG tablet Take 650 mg by mouth 2 (two) times daily.  ? atorvastatin (LIPITOR) 40 MG tablet Take 1 tablet (40 mg total) by mouth daily.  ? Calcium Carbonate-Vitamin D (CALTRATE 600+D PO) Take 1 tablet by mouth  2 (two) times daily.  ? cholecalciferol (VITAMIN D3) 25 MCG (1000 UNIT) tablet Take 1,000 Units by mouth daily.  ? clopidogrel (PLAVIX) 75 MG tablet Take 75 mg by mouth daily.  ? metFORMIN (GLUCOPHAGE) 500 MG tablet Take 750 mg by mouth every morning.  ? Multiple Vitamins-Minerals (CENTRUM SILVER PO) Take 1 tablet by mouth daily.  ? oxybutynin (DITROPAN-XL) 5 MG 24 hr tablet Take 5 mg by mouth at bedtime.  ? sennosides-docusate sodium (SENOKOT-S) 8.6-50 MG tablet Take 1 tablet by mouth in the morning  and at bedtime.  ? zinc oxide 20 % ointment Apply 1 application topically as needed for irritation.  ? ?No facility-administered encounter medications on file as of 02/09/2022.  ? ? ?Review of Systems  ?Unable to perform ROS: Patient nonverbal  ? ?Immunization History  ?Administered Date(s) Administered  ? Influenza Inj Mdck Quad Pf 08/10/2019  ? Influenza-Unspecified 08/13/2020, 09/02/2021  ? Moderna SARS-COV2 Booster Vaccination 04/16/2021  ? Moderna Sars-Covid-2 Vaccination 12/11/2019, 01/08/2020  ? Unspecified SARS-COV-2 Vaccination 09/23/2020, 07/30/2021  ? Zoster Recombinat (Shingrix) 07/01/2020  ? Zoster, Live 07/01/2020  ? ?Pertinent  Health Maintenance Due  ?Topic Date Due  ? INFLUENZA VACCINE  06/09/2022  ? HEMOGLOBIN A1C  07/18/2022  ? FOOT EXAM  08/20/2022  ? OPHTHALMOLOGY EXAM  12/04/2022  ? URINE MICROALBUMIN  01/11/2023  ? DEXA SCAN  Completed  ? ? ?  07/07/2020  ?  8:37 PM 07/08/2020  ?  2:54 AM 07/08/2020  ? 10:36 AM 07/10/2020  ? 10:51 AM 06/25/2021  ?  4:10 PM  ?Fall Risk  ?Falls in the past year?     1  ?Was there an injury with Fall?     0  ?Fall Risk Category Calculator     1  ?Fall Risk Category     Low  ?Patient Fall Risk Level High fall risk Low fall risk Moderate fall risk Moderate fall risk Moderate fall risk  ?Patient at Risk for Falls Due to     History of fall(s);Impaired balance/gait;Impaired mobility  ?Fall risk Follow up     Falls evaluation completed;Education provided;Falls prevention discussed  ? ?Functional Status Survey: ?  ? ?Vitals:  ? 02/09/22 0944  ?BP: 107/69  ?Pulse: 88  ?Resp: 20  ?Temp: (!) 96.8 ?F (36 ?C)  ?SpO2: 98%  ?Weight: 147 lb 8 oz (66.9 kg)  ?Height: 5\' 3"  (1.6 m)  ? ?Body mass index is 26.13 kg/m? ?Physical Exam ?Vitals reviewed. Nursing note reviewed: Pale, forehead warm. ?Constitutional:   ?   Appearance: Normal appearance. She is not ill-appearing.  ?HENT:  ?   Head: Normocephalic.  ?   Right Ear: There is no impacted cerumen.  ?   Left Ear: There is no impacted  cerumen.  ?   Nose: Nose normal.  ?   Mouth/Throat:  ?   Mouth: Mucous membranes are moist.  ?Eyes:  ?   General:     ?   Right eye: No discharge.     ?   Left eye: No discharge.  ?   Extraocular Movements: Extraocular movements intact.  ?   Pupils: Pupils are equal, round, and reactive to light.  ?Neck:  ?   Vascular: No carotid bruit.  ?Cardiovascular:  ?   Rate and Rhythm: Normal rate and regular rhythm.  ?   Pulses: Normal pulses.  ?   Heart sounds: Normal heart sounds. No murmur heard. ?Pulmonary:  ?   Effort: Pulmonary effort is normal. No respiratory  distress.  ?   Breath sounds: Examination of the right-middle field reveals decreased breath sounds. Examination of the left-middle field reveals decreased breath sounds. Examination of the right-lower field reveals decreased breath sounds. Examination of the left-lower field reveals decreased breath sounds. Decreased breath sounds present. No wheezing.  ?Abdominal:  ?   General: Bowel sounds are normal. There is no distension.  ?   Palpations: Abdomen is soft.  ?   Tenderness: There is no abdominal tenderness.  ?Musculoskeletal:  ?   Cervical back: Neck supple.  ?   Right lower leg: No edema.  ?   Left lower leg: No edema.  ?Lymphadenopathy:  ?   Cervical: No cervical adenopathy.  ?Skin: ?   General: Skin is warm and dry.  ?   Capillary Refill: Capillary refill takes less than 2 seconds.  ?Neurological:  ?   General: No focal deficit present.  ?   Mental Status: She is easily aroused.  ?   Motor: Weakness present.  ?   Gait: Gait abnormal.  ?   Comments: wheelchair  ?Psychiatric:     ?   Speech: She is noncommunicative.     ?   Cognition and Memory: Memory is impaired.  ?   Comments: Followed commands, nonverbal  ? ? ?Labs reviewed: ?Recent Labs  ?  02/14/21 ?0000 10/16/21 ?0000  ?NA 138 139  ?K 4.1 3.9  ?CL 105 104  ?CO2 24* 24*  ?BUN 21 16  ?CREATININE 0.8 0.8  ?CALCIUM 9.0 8.9  ? ?Recent Labs  ?  02/14/21 ?0000 10/16/21 ?0000  ?AST  --  12*  ?ALT  --  11   ?ALKPHOS  --  37  ?ALBUMIN 3.7 3.6  ? ?Recent Labs  ?  02/14/21 ?0000 10/16/21 ?0000  ?WBC 6.2  --   ?NEUTROABS 3,168.00 2,576.00  ?HGB 12.1 11.9*  ?HCT 37 35*  ?PLT 248 224  ? ?Lab Results  ?Component V

## 2022-02-10 ENCOUNTER — Encounter: Payer: Self-pay | Admitting: Nurse Practitioner

## 2022-02-10 DIAGNOSIS — J189 Pneumonia, unspecified organism: Secondary | ICD-10-CM | POA: Insufficient documentation

## 2022-02-11 ENCOUNTER — Non-Acute Institutional Stay (SKILLED_NURSING_FACILITY): Payer: Medicare Other | Admitting: Orthopedic Surgery

## 2022-02-11 ENCOUNTER — Encounter: Payer: Self-pay | Admitting: Orthopedic Surgery

## 2022-02-11 DIAGNOSIS — E1149 Type 2 diabetes mellitus with other diabetic neurological complication: Secondary | ICD-10-CM

## 2022-02-11 DIAGNOSIS — R400 Somnolence: Secondary | ICD-10-CM | POA: Diagnosis not present

## 2022-02-11 DIAGNOSIS — Z8673 Personal history of transient ischemic attack (TIA), and cerebral infarction without residual deficits: Secondary | ICD-10-CM

## 2022-02-11 DIAGNOSIS — E1169 Type 2 diabetes mellitus with other specified complication: Secondary | ICD-10-CM

## 2022-02-11 DIAGNOSIS — R32 Unspecified urinary incontinence: Secondary | ICD-10-CM

## 2022-02-11 DIAGNOSIS — I10 Essential (primary) hypertension: Secondary | ICD-10-CM | POA: Diagnosis not present

## 2022-02-11 DIAGNOSIS — J189 Pneumonia, unspecified organism: Secondary | ICD-10-CM | POA: Diagnosis not present

## 2022-02-11 DIAGNOSIS — R419 Unspecified symptoms and signs involving cognitive functions and awareness: Secondary | ICD-10-CM | POA: Diagnosis not present

## 2022-02-11 DIAGNOSIS — R270 Ataxia, unspecified: Secondary | ICD-10-CM

## 2022-02-11 DIAGNOSIS — R911 Solitary pulmonary nodule: Secondary | ICD-10-CM

## 2022-02-11 DIAGNOSIS — M810 Age-related osteoporosis without current pathological fracture: Secondary | ICD-10-CM

## 2022-02-11 DIAGNOSIS — E785 Hyperlipidemia, unspecified: Secondary | ICD-10-CM

## 2022-02-11 NOTE — Progress Notes (Signed)
?Location:  [facility] ?Nursing Home Room Number: N04/A ?Place of Service:  SNF (31) ?Provider:  Octavia Heir, NP ? ?Patient Care Team: ?Mahlon Gammon, MD as PCP - General (Internal Medicine) ?[name], DO as Consulting Physician (Neurology) ? ?Extended Emergency Contact Information ?Primary Emergency Contact: [name],[name] ?Address: [address] ?         [place] [address] [place] ?Home Phone: [phone] ?Relation: Sister ?Secondary Emergency Contact: Kibbe,Rick ?Address: [address] ?         [place], [place] [address] [place] ?Mobile Phone: [phone] ?Relation: Brother ? ?Code Status:  DNR ?Goals of care: Advanced Directive information ? ?  02/11/2022  ?  9:44 AM  ?Advanced Directives  ?Does Patient Have a Medical Advance Directive? Yes  ?Type of Estate agent of [place];Living will;Out of facility DNR (pink MOST or yellow form)  ?Does patient want to make changes to medical advance directive? No - Patient declined  ?Copy of Healthcare Power of Attorney in Chart? Yes - validated most recent copy scanned in chart (See row information)  ?Pre-existing out of facility DNR order (yellow form or pink MOST form) Pink MOST/Yellow Form most recent copy in chart - Physician notified to receive inpatient order  ? ? ? ?Chief Complaint  ?Patient presents with  ? Acute Visit  ?  Pneumonia  ? ? ?HPI:  ?[name] seen today for an acute visit for pneumonia.  ? ?04/02 she was seen for somnolence. CBC/diff and cmp unremarkable. WBC 6.5, BUN/creat 16/0.84, Na+136. CXR revealed left lower infiltrate. She was started on doxycycline 100 mg po bid. Today, she is sitting up in bed eating breakfast. She is able to answer my questions and follow commands. Afebrile. Vitals stable.  ? ?T2DM- A1c 6.9 (2021), 6.7 02/14/2021, 8.2 10/16/2021, 7.1 (01/15/2022) no recent hypoglycemic events, remains on metformin 500 mg twice daily, regular  diet ?Neurocognitive disorder- BIMS 8 12/09/2021, requiring more assistance with ADLs, no behavioral outbursts ?HTN- BUN/creat 16/0.8 10/16/2021, off medications at this time ?Osteoporosis- past use of Fosamax, family does not want further bone density tests, remains on calcium and vitamin D daily ?Lung nodule- 01/13/2021 2.5 mm nodule to right lung noted, 5 mm nodule to right shoulder also found by CT scan, watch/wait per ortho, CXR in future ?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 ? ?Past Medical History:  ?Diagnosis Date  ? Diabetes mellitus   ? Dyslipidemia   ? Hypertension   ? Osteoporosis   ? Urinary incontinence   ? ?History reviewed. No pertinent surgical history. ? ?Allergies  ?Allergen Reactions  ? Amoxicillin-Pot Clavulanate Hives  ? Meloxicam   ?  Unknown reaction  ? Scallops [Shellfish Allergy] Other (See Comments)  ?  "I don't feel so good after an hour"  ? Toviaz [Fesoterodine Fumarate Er]   ?  Unknown reaction  ? ? ?Outpatient Encounter Medications as of 02/11/2022  ?Medication Sig  ? acetaminophen (TYLENOL) 325 MG tablet Take 650 mg by mouth 2 (two) times daily.  ? atorvastatin (LIPITOR) 40 MG tablet Take 1 tablet (40 mg total) by mouth daily.  ? Calcium Carbonate-Vitamin D (CALTRATE 600+D PO) Take 1 tablet by mouth 2 (two) times daily.  ? cholecalciferol (VITAMIN D3) 25 MCG (1000 UNIT) tablet Take 1,000 Units by  mouth daily.  ? clopidogrel (PLAVIX) 75 MG tablet Take 75 mg by mouth daily.  ? doxycycline (VIBRAMYCIN) 100 MG capsule Take 100 mg by mouth 2 (two) times daily.  ? metFORMIN (GLUCOPHAGE) 500 MG tablet Take 750 mg by mouth every morning.  ? Multiple Vitamins-Minerals (CENTRUM SILVER PO) Take 1 tablet by mouth daily.  ? oxybutynin (DITROPAN-XL) 5 MG 24 hr tablet Take 5 mg by mouth at bedtime.  ? sennosides-docusate sodium (SENOKOT-S)  8.6-50 MG tablet Take 1 tablet by mouth in the morning and at bedtime.  ? zinc oxide 20 % ointment Apply 1 application topically as needed for irritation.  ? ?No facility-administered encounter medications on file as of 02/11/2022.  ? ? ?Review of Systems  ?Unable to perform ROS: Dementia  ? ?Immunization History  ?Administered Date(s) Administered  ? Influenza Inj Mdck Quad Pf 08/10/2019  ? Influenza-Unspecified 08/13/2020, 09/02/2021  ? Moderna SARS-COV2 Booster Vaccination 04/16/2021  ? Moderna Sars-Covid-2 Vaccination 12/11/2019, 01/08/2020  ? Unspecified SARS-COV-2 Vaccination 09/23/2020, 07/30/2021  ? Zoster Recombinat (Shingrix) 07/01/2020  ? Zoster, Live 07/01/2020  ? ?Pertinent  Health Maintenance Due  ?Topic Date Due  ? INFLUENZA VACCINE  06/09/2022  ? HEMOGLOBIN A1C  07/18/2022  ? FOOT EXAM  08/20/2022  ? OPHTHALMOLOGY EXAM  12/04/2022  ? URINE MICROALBUMIN  01/11/2023  ? DEXA SCAN  Completed  ? ? ?  07/07/2020  ?  8:37 PM 07/08/2020  ?  2:54 AM 07/08/2020  ? 10:36 AM 07/10/2020  ? 10:51 AM 06/25/2021  ?  4:10 PM  ?Fall Risk  ?Falls in the past year?     1  ?Was there an injury with Fall?     0  ?Fall Risk Category Calculator     1  ?Fall Risk Category     Low  ?Patient Fall Risk Level High fall risk Low fall risk Moderate fall risk Moderate fall risk Moderate fall risk  ?Patient at Risk for Falls Due to     History of fall(s);Impaired balance/gait;Impaired mobility  ?Fall risk Follow up     Falls evaluation completed;Education provided;Falls prevention discussed  ? ?Functional Status Survey: ?  ? ?Vitals:  ? 02/11/22 0940  ?BP: 121/72  ?Pulse: 92  ?Resp: 18  ?Temp: (!) 96.6 ?F (35.9 ?C)  ?SpO2: 95%  ?Weight: 147 lb 8 oz (66.9 kg)  ?Height: 5\' 3"  (1.6 m)  ? ?Body mass index is 26.13 kg/m? ?Physical Exam ?Vitals reviewed.  ?Constitutional:   ?   General: She is not in acute distress. ?HENT:  ?   Head: Normocephalic.  ?Eyes:  ?   General:     ?   Right eye: No discharge.     ?   Left eye: No discharge.   ?Cardiovascular:  ?   Rate and Rhythm: Normal rate and regular rhythm.  ?   Pulses: Normal pulses.  ?   Heart sounds: Normal heart sounds.  ?Pulmonary:  ?   Effort: Pulmonary effort is normal. No respiratory distress.  ?   Breath sounds: Examination of the left-middle field reveals decreased breath sounds. Examination of the left-lower field reveals decreased breath sounds. Decreased breath sounds present. No wheezing or rales.  ?Abdominal:  ?   General: Bowel sounds are normal. There is no distension.  ?   Palpations: Abdomen is soft.  ?   Tenderness: There is no abdominal tenderness.  ?Musculoskeletal:  ?   Cervical back: Neck supple.  ?   Right lower leg: No edema.  ?  Left lower leg: No edema.  ?Skin: ?   General: Skin is warm and dry.  ?   Capillary Refill: Capillary refill takes less than 2 seconds.  ?Neurological:  ?   General: No focal deficit present.  ?   Mental Status: She is alert. Mental status is at baseline.  ?   Motor: Weakness present.  ?   Gait: Gait abnormal.  ?Psychiatric:     ?   Mood and Affect: Mood normal.     ?   Behavior: Behavior normal.     ?   Cognition and Memory: Memory is impaired.  ?   Comments: Very pleasant, follows commands, alert to self and familiar faces  ? ? ?Labs reviewed: ?Recent Labs  ?  02/14/21 ?0000 10/16/21 ?0000  ?NA 138 139  ?K 4.1 3.9  ?CL 105 104  ?CO2 24* 24*  ?BUN 21 16  ?CREATININE 0.8 0.8  ?CALCIUM 9.0 8.9  ? ?Recent Labs  ?  02/14/21 ?0000 10/16/21 ?0000  ?AST  --  12*  ?ALT  --  11  ?ALKPHOS  --  37  ?ALBUMIN 3.7 3.6  ? ?Recent Labs  ?  02/14/21 ?0000 10/16/21 ?0000  ?WBC 6.2  --   ?NEUTROABS 3,168.00 2,576.00  ?HGB 12.1 11.9*  ?HCT 37 35*  ?PLT 248 224  ? ?Lab Results  ?Component Value Date  ? TSH 1.14 10/16/2021  ? ?Lab Results  ?Component Value Date  ? HGBA1C 7.1 01/15/2022  ? ?Lab Results  ?Component Value Date  ? CHOL 131 10/16/2021  ? HDL 37 10/16/2021  ? LDLCALC 67 10/16/2021  ? TRIG 196 (A) 10/16/2021  ? CHOLHDL 4.5 08/20/2019  ? ? ?Significant  Diagnostic Results in last 30 days:  ?No results found. ? ?Assessment/Plan ?1. Pneumonia of left lower lobe due to infectious organism ?- 04/03 CXR left lower infiltrate ?- WBC 6.5, afebrile, O2 sats > 90% ?- doxycycline 100 mg po bis

## 2022-02-16 ENCOUNTER — Non-Acute Institutional Stay (SKILLED_NURSING_FACILITY): Payer: Medicare Other | Admitting: Orthopedic Surgery

## 2022-02-16 ENCOUNTER — Encounter: Payer: Self-pay | Admitting: Orthopedic Surgery

## 2022-02-16 DIAGNOSIS — N3000 Acute cystitis without hematuria: Secondary | ICD-10-CM | POA: Diagnosis not present

## 2022-02-16 DIAGNOSIS — E1169 Type 2 diabetes mellitus with other specified complication: Secondary | ICD-10-CM

## 2022-02-16 DIAGNOSIS — J189 Pneumonia, unspecified organism: Secondary | ICD-10-CM

## 2022-02-16 DIAGNOSIS — E1149 Type 2 diabetes mellitus with other diabetic neurological complication: Secondary | ICD-10-CM

## 2022-02-16 DIAGNOSIS — R270 Ataxia, unspecified: Secondary | ICD-10-CM

## 2022-02-16 DIAGNOSIS — M810 Age-related osteoporosis without current pathological fracture: Secondary | ICD-10-CM

## 2022-02-16 DIAGNOSIS — R419 Unspecified symptoms and signs involving cognitive functions and awareness: Secondary | ICD-10-CM | POA: Diagnosis not present

## 2022-02-16 DIAGNOSIS — I10 Essential (primary) hypertension: Secondary | ICD-10-CM

## 2022-02-16 DIAGNOSIS — R32 Unspecified urinary incontinence: Secondary | ICD-10-CM

## 2022-02-16 DIAGNOSIS — R911 Solitary pulmonary nodule: Secondary | ICD-10-CM

## 2022-02-16 DIAGNOSIS — E785 Hyperlipidemia, unspecified: Secondary | ICD-10-CM

## 2022-02-16 DIAGNOSIS — Z8673 Personal history of transient ischemic attack (TIA), and cerebral infarction without residual deficits: Secondary | ICD-10-CM

## 2022-02-16 NOTE — Progress Notes (Signed)
?Location:  [facility] ?Nursing Home Room Number: N04/A ?Place of Service:  SNF (31) ?Provider:  Octavia Heir, NP ? ?Patient Care Team: ?Mahlon Gammon, MD as PCP - General (Internal Medicine) ?[name], DO as Consulting Physician (Neurology) ? ?Extended Emergency Contact Information ?Primary Emergency Contact: [name],[name] ?Address: [address] ?         [place] [address] [place] ?Home Phone: [phone] ?Relation: Sister ?Secondary Emergency Contact: Wilcoxen,Rick ?Address: [address] ?         [place], [place] [address] [place] ?Mobile Phone: [phone] ?Relation: Brother ? ?Code Status:  DNR ?Goals of care: Advanced Directive information ? ?  02/16/2022  ? 10:06 AM  ?Advanced Directives  ?Does Patient Have a Medical Advance Directive? Yes  ?Type of Estate agent of [place];Living will;Out of facility DNR (pink MOST or yellow form)  ?Does patient want to make changes to medical advance directive? No - Patient declined  ?Copy of Healthcare Power of Attorney in Chart? Yes - validated most recent copy scanned in chart (See row information)  ?Pre-existing out of facility DNR order (yellow form or pink MOST form) Pink MOST/Yellow Form most recent copy in chart - Physician notified to receive inpatient order  ? ? ? ?Chief Complaint  ?Patient presents with  ? Medical Management of Chronic Issues  ?  Routine visit.   ? Quality Metric Gaps  ?  Discuss the need for Hep C screening, TDAP, Pneumonia, and Shingrix vaccine, or post pone if patient refuses.   ? ? ?HPI:  ?[name] seen today for medical management of chronic diseases.   ? ?She currently resides on the skilled nursing unit at [facility]. Past medical history includes: HTN, T2DM, constipation, neurocognitive disorder, osteoporosis, dyslipidemia, hx of lung nodule, lung nodule, and abnormal gait.  ?  ?Left lower lobe pneumonia- seen 04/03 for somnolence, 04/03 CXR  confirmed pneumonia, started on doxycycline 100 mg po bid x 7 days- last day ABX today, also on mucinex, doing well, continues to cough up phlegm ?UTI- urine culture 02/09/2022 > 100,000 CFU/mL E. Coli, poor historian due to cognitive disorder, nursing reports more incontinent episodes ?T2DM- A1c 6.9 (2021), 6.7 02/14/2021, 8.2 10/16/2021, 7.1 (01/15/2022) no recent hypoglycemic events, remains on metformin 500 mg twice daily, regular diet ?Neurocognitive disorder- BIMS 8 12/09/2021, requiring more assistance with ADLs, less talkative, no behavioral outbursts ?HTN- BUN/creat 16/0.8 02/09/2022, off medications at this time ?Osteoporosis- past use of Fosamax, family does not want further bone density tests, remains on calcium and vitamin D daily ?Lung nodule- 01/13/2021 2.5 mm nodule to right lung noted, 5 mm nodule to right shoulder also found by CT scan, watch/wait per ortho, CXR in future ?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 ?No recent falls or injuries.  ? ?Recent blood pressures: ? 04/04- 99/61 ? 03/30- 131/76 ? 03/29- 134/86 ? ?Recent weights: ? 04/03- 128.8 lbs ? 03/02- 127.5 lbs ? 02/01- 131.5 lbs ? ?  ? ?Past Medical History:  ?Diagnosis Date  ? Diabetes mellitus   ? Dyslipidemia   ? Hypertension   ? Osteoporosis   ? Urinary incontinence   ? ?History reviewed. No pertinent surgical history. ? ?Allergies  ?Allergen Reactions  ? Amoxicillin-Pot Clavulanate Hives  ? Meloxicam   ?  Unknown reaction  ? Scallops [Shellfish Allergy] Other (See Comments)  ?  "I don't feel so good after an hour"  ? Toviaz [Fesoterodine Fumarate Er]   ?  Unknown reaction  ? ? ?Outpatient Encounter Medications as of 02/16/2022  ?Medication Sig  ? acetaminophen (TYLENOL) 325 MG tablet Take 650 mg by mouth 2 (two) times daily.  ? atorvastatin (LIPITOR) 40 MG tablet Take 1 tablet  (40 mg total) by mouth daily.  ? Calcium Carbonate-Vitamin D (CALTRATE 600+D PO) Take 1 tablet by mouth 2 (two) times daily.  ? cholecalciferol (VITAMIN D3) 25 MCG (1000 UNIT) tablet Take 1,000 Units by mouth daily.  ? clopidogrel (PLAVIX) 75 MG tablet Take 75 mg by mouth daily.  ? guaiFENesin (MUCINEX) 600 MG 12 hr tablet Take 600 mg by mouth 2 (two) times daily.  ? metFORMIN (GLUCOPHAGE) 1000 MG tablet Take 1,000 mg by mouth 2 (two) times daily with a meal.  ? Multiple Vitamins-Minerals (CENTRUM SILVER PO) Take 1 tablet by mouth daily.  ? oxybutynin (DITROPAN-XL) 5 MG 24 hr tablet Take 5 mg by mouth at bedtime.  ? sennosides-docusate sodium (SENOKOT-S) 8.6-50 MG tablet Take 1 tablet by mouth in the morning and at bedtime.  ? zinc oxide 20 % ointment Apply 1 application topically as needed for irritation.  ? [DISCONTINUED] doxycycline (VIBRAMYCIN) 100 MG capsule Take 100 mg by mouth 2 (two) times daily.  ? [DISCONTINUED] metFORMIN (GLUCOPHAGE) 500 MG tablet Take 750 mg by mouth every morning.  ? ?No facility-administered encounter medications on file as of 02/16/2022.  ? ? ?Review of Systems  ?Unable to perform ROS: Patient nonverbal  ? ?Immunization History  ?Administered Date(s) Administered  ? Influenza Inj Mdck Quad Pf 08/10/2019  ? Influenza-Unspecified 08/13/2020, 09/02/2021  ? Moderna SARS-COV2 Booster Vaccination 04/16/2021  ? Moderna Sars-Covid-2 Vaccination 12/11/2019, 01/08/2020  ? Unspecified SARS-COV-2 Vaccination 09/23/2020, 07/30/2021  ? Zoster Recombinat (Shingrix) 07/01/2020  ? Zoster, Live 07/01/2020  ? ?Pertinent  Health Maintenance Due  ?Topic Date Due  ? INFLUENZA VACCINE  06/09/2022  ? HEMOGLOBIN A1C  07/18/2022  ? FOOT EXAM  08/20/2022  ? OPHTHALMOLOGY EXAM  12/04/2022  ? URINE MICROALBUMIN  01/11/2023  ? DEXA SCAN  Completed  ? ? ?  07/07/2020  ?  8:37 PM 07/08/2020  ?  2:54 AM 07/08/2020  ? 10:36 AM 07/10/2020  ? 10:51 AM 06/25/2021  ?  4:10 PM  ?Fall Risk  ?Falls in the past year?     1  ?Was  there an injury with Fall?     0  ?Fall Risk Category Calculator     1  ?Fall Risk Category     Low  ?Patient Fall Risk Level High fall risk Low fall risk Moderate fall risk Moderate fall risk Moderate fall risk  ?Patient at Risk for Falls Due to     History of fall(s);Impaired balance/gait;Impaired mobility  ?Fall risk Follow up     Falls evaluation completed;Education provided;Falls prevention discussed  ? ?Functional Status Survey: ?  ? ?Vitals:  ? 02/16/22 1001  ?BP: 121/72  ?Pulse: 92  ?Resp: 18  ?Temp: 97.6 ?F (36.4 ?C)  ?SpO2: 95%  ?Weight: 142 lb 14.4 oz (64.8 kg)  ?Height: 5\' 3"  (1.6 m)  ? ?Body mass index is 25.31 kg/m?Marland Kitchen. ?Physical Exam ?Vitals reviewed.  ?Constitutional:   ?   General: She is not in acute distress. ?HENT:  ?   Head: Normocephalic.  ?   Right Ear: There is no impacted cerumen.  ?  Left Ear: There is no impacted cerumen.  ?   Nose: Nose normal.  ?   Mouth/Throat:  ?   Mouth: Mucous membranes are moist.  ?Eyes:  ?   General:     ?   Right eye: No discharge.     ?   Left eye: No discharge.  ?Neck:  ?   Vascular: No carotid bruit.  ?Cardiovascular:  ?   Rate and Rhythm: Normal rate and regular rhythm.  ?   Pulses: Normal pulses.  ?   Heart sounds: Normal heart sounds.  ?Pulmonary:  ?   Effort: Pulmonary effort is normal. No respiratory distress.  ?   Breath sounds: Examination of the right-upper field reveals rhonchi. Examination of the left-upper field reveals rhonchi. Rhonchi present. No decreased breath sounds or wheezing.  ?Abdominal:  ?   General: Bowel sounds are normal. There is no distension.  ?   Palpations: Abdomen is soft.  ?   Tenderness: There is no abdominal tenderness.  ?Musculoskeletal:  ?   Cervical back: Neck supple.  ?   Right lower leg: No edema.  ?   Left lower leg: No edema.  ?Lymphadenopathy:  ?   Cervical: No cervical adenopathy.  ?Skin: ?   General: Skin is warm and dry.  ?   Capillary Refill: Capillary refill takes less than 2 seconds.  ?Neurological:  ?   General: No  focal deficit present.  ?   Mental Status: She is alert. Mental status is at baseline.  ?   Motor: Weakness present.  ?   Gait: Gait abnormal.  ?   Comments: wheelchair  ?Psychiatric:     ?   Mood and Affect:

## 2022-03-13 DIAGNOSIS — F0390 Unspecified dementia without behavioral disturbance: Secondary | ICD-10-CM | POA: Diagnosis not present

## 2022-03-13 DIAGNOSIS — R4789 Other speech disturbances: Secondary | ICD-10-CM | POA: Diagnosis not present

## 2022-03-17 ENCOUNTER — Encounter: Payer: Self-pay | Admitting: Internal Medicine

## 2022-03-17 NOTE — Progress Notes (Signed)
Location:   [facility] Room Number: 4 Place of Service:  SNF [phone]) Provider:  Einar Crow MD  Mahlon Gammon, MD  Patient Care Team: Mahlon Gammon, MD as PCP - General (Internal Medicine) [name], DO as Consulting Physician (Neurology)  Extended Emergency Contact Information Primary Emergency Contact: [name],[name] Address: [address]          [place] [address] [place] Home Phone: [phone] Relation: Sister Secondary Emergency Contact: [facility] Address: [address] [place], [place] [address] [place] Phone: [phone] Relation: Brother  Code Status:  DNR Managed Care Goals of care: Advanced Directive information    03/17/2022    4:11 PM  Advanced Directives  Does Patient Have a Medical Advance Directive? Yes  Type of Estate agent of [place];Living will;Out of facility DNR (pink MOST or yellow form)  Does patient want to make changes to medical advance directive? No - Patient declined  Copy of Healthcare Power of Attorney in Chart? Yes - validated most recent copy scanned in chart (See row information)  Pre-existing out of facility DNR order (yellow form or pink MOST form) Pink MOST/Yellow Form most recent copy in chart - Physician notified to receive inpatient order     Chief Complaint  Patient presents with   Medical Management of Chronic Issues   Quality Metric Gaps    Verified [facility] and [facility] patient is due for Hep. C screen and #2 shingrix.    HPI:  [name] seen today for medical management of chronic diseases.     Past Medical History:  Diagnosis Date   Diabetes mellitus    Dyslipidemia    Hypertension    Osteoporosis    Urinary incontinence    History reviewed. No pertinent surgical history.  Allergies  Allergen Reactions   Amoxicillin-Pot Clavulanate Hives   Meloxicam     Unknown reaction   Scallops [Shellfish  Allergy] Other (See Comments)    "I don't feel so good after an hour"   Toviaz [Fesoterodine Fumarate Er]     Unknown reaction    Allergies as of 03/17/2022       Reactions   Amoxicillin-pot Clavulanate Hives   Meloxicam    Unknown reaction   Scallops [shellfish Allergy] Other (See Comments)   "I don't feel so good after an hour"   Toviaz [fesoterodine Fumarate Er]    Unknown reaction        Medication List        Accurate as of Mar 17, 2022  4:11 PM. If you have any questions, ask your nurse or doctor.          STOP taking these medications    guaiFENesin 600 MG 12 hr tablet Commonly known as: MUCINEX Stopped by: Mahlon Gammon, MD       TAKE these medications    acetaminophen 325 MG tablet Commonly known as: TYLENOL Take 650 mg by mouth 2 (two) times daily.   atorvastatin 40 MG tablet Commonly known as: LIPITOR Take 1 tablet (40 mg total) by mouth daily.   CALTRATE 600+D PO Take 1 tablet by mouth 2 (two) times daily.   CENTRUM SILVER PO Take 1 tablet by mouth daily.   cholecalciferol 25 MCG (1000 UNIT) tablet Commonly known as: VITAMIN D3 Take 1,000 Units by mouth daily.   clopidogrel 75 MG tablet Commonly  known as: PLAVIX Take 75 mg by mouth daily.   metFORMIN 1000 MG tablet Commonly known as: GLUCOPHAGE Take 1,000 mg by mouth 2 (two) times daily with a meal.   oxybutynin 5 MG 24 hr tablet Commonly known as: DITROPAN-XL Take 5 mg by mouth at bedtime.   sennosides-docusate sodium 8.6-50 MG tablet Commonly known as: SENOKOT-S Take 1 tablet by mouth in the morning and at bedtime.   zinc oxide 20 % ointment Apply 1 application topically as needed for irritation.        Review of Systems  Immunization History  Administered Date(s) Administered   Influenza Inj Mdck Quad Pf 08/10/2019   Influenza-Unspecified 08/13/2020, 09/02/2021   Moderna SARS-COV2 Booster Vaccination 04/16/2021   Moderna Sars-Covid-2 Vaccination 12/11/2019,  01/08/2020   PNEUMOCOCCAL CONJUGATE-20 01/08/2022   Tdap 01/07/2022   Unspecified SARS-COV-2 Vaccination 09/23/2020, 07/30/2021   Zoster Recombinat (Shingrix) 07/01/2020   Zoster, Live 07/01/2020   Pertinent  Health Maintenance Due  Topic Date Due   INFLUENZA VACCINE  06/09/2022   HEMOGLOBIN A1C  07/18/2022   FOOT EXAM  08/20/2022   OPHTHALMOLOGY EXAM  12/04/2022   URINE MICROALBUMIN  01/11/2023   DEXA SCAN  Completed      07/07/2020    8:37 PM 07/08/2020    2:54 AM 07/08/2020   10:36 AM 07/10/2020   10:51 AM 06/25/2021    4:10 PM  Fall Risk  Falls in the past year?     1  Was there an injury with Fall?     0  Fall Risk Category Calculator     1  Fall Risk Category     Low  Patient Fall Risk Level High fall risk Low fall risk Moderate fall risk Moderate fall risk Moderate fall risk  Patient at Risk for Falls Due to     History of fall(s);Impaired balance/gait;Impaired mobility  Fall risk Follow up     Falls evaluation completed;Education provided;Falls prevention discussed   Functional Status Survey:    Vitals:   03/17/22 1609  BP: 132/70  Pulse: 68  Resp: 20  Temp: 98.1 F (36.7 C)  SpO2: 100%  Weight: 138 lb 3.2 oz (62.7 kg)  Height: 5\' 3"  (1.6 m)   Body mass index is 24.48 kg/m. Physical Exam  Labs reviewed: Recent Labs    10/16/21 0000 02/09/22 0000  NA 139 136*  K 3.9 3.9  CL 104 100  CO2 24* 27*  BUN 16 16  CREATININE 0.8 0.8  CALCIUM 8.9 9.1   Recent Labs    10/16/21 0000 02/09/22 0000  AST 12* 20  ALT 11 18  ALKPHOS 37 49  ALBUMIN 3.6 4.0   Recent Labs    10/16/21 0000 02/09/22 0000  WBC  --  6.5  NEUTROABS 2,576.00 5,434.00  HGB 11.9* 12.8  HCT 35* 38  PLT 224 204   Lab Results  Component Value Date   TSH 1.14 10/16/2021   Lab Results  Component Value Date   HGBA1C 7.1 01/15/2022   Lab Results  Component Value Date   CHOL 131 10/16/2021   HDL 37 10/16/2021   LDLCALC 67 10/16/2021   TRIG 196 (A) 10/16/2021   CHOLHDL 4.5  08/20/2019    Significant Diagnostic Results in last 30 days:  No results found.  Assessment/Plan There are no diagnoses linked to this encounter.   Family/ staff Communication:   Labs/tests ordered:

## 2022-03-18 ENCOUNTER — Non-Acute Institutional Stay (SKILLED_NURSING_FACILITY): Payer: Medicare Other | Admitting: Orthopedic Surgery

## 2022-03-18 ENCOUNTER — Encounter: Payer: Self-pay | Admitting: Orthopedic Surgery

## 2022-03-18 DIAGNOSIS — J189 Pneumonia, unspecified organism: Secondary | ICD-10-CM

## 2022-03-18 DIAGNOSIS — R296 Repeated falls: Secondary | ICD-10-CM | POA: Diagnosis not present

## 2022-03-18 DIAGNOSIS — N3000 Acute cystitis without hematuria: Secondary | ICD-10-CM

## 2022-03-18 DIAGNOSIS — M6281 Muscle weakness (generalized): Secondary | ICD-10-CM | POA: Diagnosis not present

## 2022-03-18 DIAGNOSIS — F0390 Unspecified dementia without behavioral disturbance: Secondary | ICD-10-CM | POA: Diagnosis not present

## 2022-03-18 DIAGNOSIS — D649 Anemia, unspecified: Secondary | ICD-10-CM | POA: Diagnosis not present

## 2022-03-18 DIAGNOSIS — R410 Disorientation, unspecified: Secondary | ICD-10-CM | POA: Diagnosis not present

## 2022-03-18 DIAGNOSIS — R2681 Unsteadiness on feet: Secondary | ICD-10-CM | POA: Diagnosis not present

## 2022-03-18 DIAGNOSIS — R419 Unspecified symptoms and signs involving cognitive functions and awareness: Secondary | ICD-10-CM

## 2022-03-18 DIAGNOSIS — I10 Essential (primary) hypertension: Secondary | ICD-10-CM | POA: Diagnosis not present

## 2022-03-18 DIAGNOSIS — E1149 Type 2 diabetes mellitus with other diabetic neurological complication: Secondary | ICD-10-CM

## 2022-03-18 LAB — COMPREHENSIVE METABOLIC PANEL
Calcium: 10.6 (ref 8.7–10.7)
eGFR: 74

## 2022-03-18 LAB — BASIC METABOLIC PANEL
BUN: 21 (ref 4–21)
CO2: 25 — AB (ref 13–22)
Chloride: 101 (ref 99–108)
Creatinine: 0.8 (ref 0.5–1.1)
Glucose: 115
Potassium: 4.2 mEq/L (ref 3.5–5.1)
Sodium: 138 (ref 137–147)

## 2022-03-18 LAB — CBC AND DIFFERENTIAL
HCT: 42 (ref 36–46)
Hemoglobin: 14.2 (ref 12.0–16.0)
Neutrophils Absolute: 3944
Platelets: 343 10*3/uL (ref 150–400)
WBC: 6.3

## 2022-03-18 LAB — CBC: RBC: 4.56 (ref 3.87–5.11)

## 2022-03-18 NOTE — Progress Notes (Signed)
?Location:  [facility] ?Nursing Home Room Number: N04//A ?Place of Service:  SNF (31) ?Provider: Octavia Heir, NP ? ?Patient Care Team: ?Mahlon Gammon, MD as PCP - General (Internal Medicine) ?[name], DO as Consulting Physician (Neurology) ? ?Extended Emergency Contact Information ?Primary Emergency Contact: [name],[name] ?Address: [address] ?         [place] [address] [place] ?Home Phone: [phone] ?Relation: Sister ?Secondary Emergency Contact: Wuertz,Rick ?Address: [address] ?         [place], [place] [address] [place] ?Mobile Phone: [phone] ?Relation: Brother ? ?Code Status:  DNR ?Goals of care: Advanced Directive information ? ?  03/18/2022  ? 10:17 AM  ?Advanced Directives  ?Does Patient Have a Medical Advance Directive? Yes  ?Type of Estate agent of [place];Living will;Out of facility DNR (pink MOST or yellow form)  ?Does patient want to make changes to medical advance directive? No - Patient declined  ?Copy of Healthcare Power of Attorney in Chart? Yes - validated most recent copy scanned in chart (See row information)  ?Pre-existing out of facility DNR order (yellow form or pink MOST form) Pink MOST/Yellow Form most recent copy in chart - Physician notified to receive inpatient order  ? ? ? ?Chief Complaint  ?Patient presents with  ? Acute Visit  ?  Increased confusion.   ? ? ?HPI:  ?[name] seen today for an acute visit for increased confusion.  ? ?She currently resides on the skilled nursing unit at [facility]. Recurrent falls since 05/01, no apparent injury, normal neuro checks. In the past 2 days her behaviors have changes per nursing. She has been observed applying ice cream over her hot dog and sandwiches. She is lso talking less. Today, she is non verbal during our encounter, follows some commands.  ? ?Left lower lobe pneumonia- seen 04/03 for somnolence, 04/03 CXR confirmed pulmonary  infiltrate to left lung base, given doxycycline x 7 days ?UTI- urine culture 02/09/2022 > 100,000 CFU/mL E. Coli, poor historian due to cognitive disorder, see above ?T2DM- A1c 6.9 (2021), 6.7 02/14/2021, 8.2 10/16/2021, 7.1 (01/15/2022) no recent hypoglycemic events, remains on metformin 500 mg twice daily, regular diet ?Neurocognitive disorder- BIMS 8 12/09/2021, requiring more assistance with ADLs, less talkative, no behavioral outbursts ? ?Past Medical History:  ?Diagnosis Date  ? Diabetes mellitus   ? Dyslipidemia   ? Hypertension   ? Osteoporosis   ? Urinary incontinence   ? ?History reviewed. No pertinent surgical history. ? ?Allergies  ?Allergen Reactions  ? Amoxicillin-Pot Clavulanate Hives  ? Meloxicam   ?  Unknown reaction  ? Scallops [Shellfish Allergy] Other (See Comments)  ?  "I don't feel so good after an hour"  ? Toviaz [Fesoterodine Fumarate Er]   ?  Unknown reaction  ? ? ?Outpatient Encounter Medications as of 03/18/2022  ?Medication Sig  ? acetaminophen (TYLENOL) 325 MG tablet Take 650 mg by mouth 2 (two) times daily.  ? atorvastatin (LIPITOR) 40 MG tablet Take 1 tablet (40 mg total) by mouth daily.  ? Calcium Carbonate-Vitamin D (CALTRATE 600+D PO) Take 1 tablet by mouth 2 (two) times daily.  ? cholecalciferol (VITAMIN D3) 25 MCG (1000 UNIT) tablet Take 1,000 Units by mouth daily.  ? clopidogrel (PLAVIX) 75 MG tablet Take 75 mg by mouth daily.  ? metFORMIN (GLUCOPHAGE) 1000 MG tablet Take 1,000 mg by mouth 2 (two) times daily with a meal.  ?  Multiple Vitamins-Minerals (CENTRUM SILVER PO) Take 1 tablet by mouth daily.  ? oxybutynin (DITROPAN-XL) 5 MG 24 hr tablet Take 5 mg by mouth at bedtime.  ? sennosides-docusate sodium (SENOKOT-S) 8.6-50 MG tablet Take 1 tablet by mouth in the morning and at bedtime.  ? zinc oxide 20 % ointment Apply 1 application topically as needed for irritation.  ? ?No facility-administered encounter medications on file as of 03/18/2022.  ? ? ?Review of Systems  ?Unable to  perform ROS: Patient nonverbal  ? ?Immunization History  ?Administered Date(s) Administered  ? Influenza Inj Mdck Quad Pf 08/10/2019  ? Influenza-Unspecified 08/13/2020, 09/02/2021  ? Moderna SARS-COV2 Booster Vaccination 04/16/2021  ? Moderna Sars-Covid-2 Vaccination 12/11/2019, 01/08/2020  ? PNEUMOCOCCAL CONJUGATE-20 01/08/2022  ? Tdap 01/07/2022  ? Unspecified SARS-COV-2 Vaccination 09/23/2020, 07/30/2021  ? Zoster Recombinat (Shingrix) 07/01/2020  ? Zoster, Live 07/01/2020  ? ?Pertinent  Health Maintenance Due  ?Topic Date Due  ? INFLUENZA VACCINE  06/09/2022  ? HEMOGLOBIN A1C  07/18/2022  ? FOOT EXAM  08/20/2022  ? OPHTHALMOLOGY EXAM  12/04/2022  ? URINE MICROALBUMIN  01/11/2023  ? DEXA SCAN  Completed  ? ? ?  07/07/2020  ?  8:37 PM 07/08/2020  ?  2:54 AM 07/08/2020  ? 10:36 AM 07/10/2020  ? 10:51 AM 06/25/2021  ?  4:10 PM  ?Fall Risk  ?Falls in the past year?     1  ?Was there an injury with Fall?     0  ?Fall Risk Category Calculator     1  ?Fall Risk Category     Low  ?Patient Fall Risk Level High fall risk Low fall risk Moderate fall risk Moderate fall risk Moderate fall risk  ?Patient at Risk for Falls Due to     History of fall(s);Impaired balance/gait;Impaired mobility  ?Fall risk Follow up     Falls evaluation completed;Education provided;Falls prevention discussed  ? ?Functional Status Survey: ?  ? ?Vitals:  ? 03/18/22 1014  ?BP: 118/72  ?Pulse: 89  ?Resp: 18  ?Temp: 97.6 ?F (36.4 ?C)  ?SpO2: 97%  ?Weight: 138 lb 3.2 oz (62.7 kg)  ?Height: 5\' 3"  (1.6 m)  ? ?Body mass index is 24.48 kg/m?Marland Kitchen. ?Physical Exam ?Vitals reviewed.  ?Constitutional:   ?   General: She is not in acute distress. ?HENT:  ?   Head: Normocephalic and atraumatic.  ?Eyes:  ?   General:     ?   Right eye: No discharge.     ?   Left eye: No discharge.  ?Cardiovascular:  ?   Rate and Rhythm: Normal rate and regular rhythm.  ?   Pulses: Normal pulses.  ?   Heart sounds: Normal heart sounds.  ?Pulmonary:  ?   Effort: Pulmonary effort is normal. No  respiratory distress.  ?   Breath sounds: Normal breath sounds. No decreased breath sounds or wheezing.  ?Abdominal:  ?   General: Bowel sounds are normal. There is no distension.  ?   Palpations: Abdomen is soft.  ?   Tenderness: There is no abdominal tenderness.  ?Musculoskeletal:  ?   Cervical back: Neck supple.  ?   Right lower leg: No edema.  ?   Left lower leg: No edema.  ?Skin: ?   General: Skin is warm and dry.  ?   Capillary Refill: Capillary refill takes less than 2 seconds.  ?   Comments: Small skin abrasion to left anterior forearm, CDI, no sign of infection  ?Neurological:  ?  General: No focal deficit present.  ?   Mental Status: She is alert. Mental status is at baseline.  ?   Motor: Weakness present.  ?   Gait: Gait abnormal.  ?   Comments: Hand grips equal 5/5 strength, smile symmetrical  ?Psychiatric:     ?   Mood and Affect: Mood normal.     ?   Behavior: Behavior normal.     ?   Cognition and Memory: Memory is impaired.  ?   Comments: Non verbal, follows some commands, alert to self  ? ? ?Labs reviewed: ?Recent Labs  ?  10/16/21 ?0000 02/09/22 ?0000  ?NA 139 136*  ?K 3.9 3.9  ?CL 104 100  ?CO2 24* 27*  ?BUN 16 16  ?CREATININE 0.8 0.8  ?CALCIUM 8.9 9.1  ? ?Recent Labs  ?  10/16/21 ?0000 02/09/22 ?0000  ?AST 12* 20  ?ALT 11 18  ?ALKPHOS 37 49  ?ALBUMIN 3.6 4.0  ? ?Recent Labs  ?  10/16/21 ?0000 02/09/22 ?0000  ?WBC  --  6.5  ?NEUTROABS 2,576.00 5,434.00  ?HGB 11.9* 12.8  ?HCT 35* 38  ?PLT 224 204  ? ?Lab Results  ?Component Value Date  ? TSH 1.14 10/16/2021  ? ?Lab Results  ?Component Value Date  ? HGBA1C 7.1 01/15/2022  ? ?Lab Results  ?Component Value Date  ? CHOL 131 10/16/2021  ? HDL 37 10/16/2021  ? LDLCALC 67 10/16/2021  ? TRIG 196 (A) 10/16/2021  ? CHOLHDL 4.5 08/20/2019  ? ? ?Significant Diagnostic Results in last 30 days:  ?No results found. ? ?Assessment/Plan ?1. Confusion ?- nonverbal x 2 days, mixing odd food together ?- exam unremarkable ?- cbc/diff- draw today ?- cmp- draw today ?-  diagnosed with pneumonia and UTI within last month- given doxycycline and symptoms improved  ?- ? UTI  ?- UA/Culture ? ?2. Recurrent falls ?- began 05/01 ?- no apparent injury ?- neuro checks normal ? ?3. P

## 2022-03-19 DIAGNOSIS — F0390 Unspecified dementia without behavioral disturbance: Secondary | ICD-10-CM | POA: Diagnosis not present

## 2022-03-19 DIAGNOSIS — R2681 Unsteadiness on feet: Secondary | ICD-10-CM | POA: Diagnosis not present

## 2022-03-19 DIAGNOSIS — R296 Repeated falls: Secondary | ICD-10-CM | POA: Diagnosis not present

## 2022-03-19 DIAGNOSIS — M6281 Muscle weakness (generalized): Secondary | ICD-10-CM | POA: Diagnosis not present

## 2022-03-20 DIAGNOSIS — M6281 Muscle weakness (generalized): Secondary | ICD-10-CM | POA: Diagnosis not present

## 2022-03-20 DIAGNOSIS — R2681 Unsteadiness on feet: Secondary | ICD-10-CM | POA: Diagnosis not present

## 2022-03-20 DIAGNOSIS — R296 Repeated falls: Secondary | ICD-10-CM | POA: Diagnosis not present

## 2022-03-20 DIAGNOSIS — F0390 Unspecified dementia without behavioral disturbance: Secondary | ICD-10-CM | POA: Diagnosis not present

## 2022-03-23 DIAGNOSIS — F0390 Unspecified dementia without behavioral disturbance: Secondary | ICD-10-CM | POA: Diagnosis not present

## 2022-03-23 DIAGNOSIS — M6281 Muscle weakness (generalized): Secondary | ICD-10-CM | POA: Diagnosis not present

## 2022-03-23 DIAGNOSIS — R2681 Unsteadiness on feet: Secondary | ICD-10-CM | POA: Diagnosis not present

## 2022-03-23 DIAGNOSIS — R296 Repeated falls: Secondary | ICD-10-CM | POA: Diagnosis not present

## 2022-03-24 DIAGNOSIS — R296 Repeated falls: Secondary | ICD-10-CM | POA: Diagnosis not present

## 2022-03-24 DIAGNOSIS — M6281 Muscle weakness (generalized): Secondary | ICD-10-CM | POA: Diagnosis not present

## 2022-03-24 DIAGNOSIS — F0390 Unspecified dementia without behavioral disturbance: Secondary | ICD-10-CM | POA: Diagnosis not present

## 2022-03-24 DIAGNOSIS — R2681 Unsteadiness on feet: Secondary | ICD-10-CM | POA: Diagnosis not present

## 2022-03-25 DIAGNOSIS — M6281 Muscle weakness (generalized): Secondary | ICD-10-CM | POA: Diagnosis not present

## 2022-03-25 DIAGNOSIS — F0390 Unspecified dementia without behavioral disturbance: Secondary | ICD-10-CM | POA: Diagnosis not present

## 2022-03-25 DIAGNOSIS — R296 Repeated falls: Secondary | ICD-10-CM | POA: Diagnosis not present

## 2022-03-25 DIAGNOSIS — R2681 Unsteadiness on feet: Secondary | ICD-10-CM | POA: Diagnosis not present

## 2022-03-26 DIAGNOSIS — M6281 Muscle weakness (generalized): Secondary | ICD-10-CM | POA: Diagnosis not present

## 2022-03-26 DIAGNOSIS — R2681 Unsteadiness on feet: Secondary | ICD-10-CM | POA: Diagnosis not present

## 2022-03-26 DIAGNOSIS — F0390 Unspecified dementia without behavioral disturbance: Secondary | ICD-10-CM | POA: Diagnosis not present

## 2022-03-26 DIAGNOSIS — R296 Repeated falls: Secondary | ICD-10-CM | POA: Diagnosis not present

## 2022-03-27 DIAGNOSIS — R2681 Unsteadiness on feet: Secondary | ICD-10-CM | POA: Diagnosis not present

## 2022-03-27 DIAGNOSIS — R296 Repeated falls: Secondary | ICD-10-CM | POA: Diagnosis not present

## 2022-03-27 DIAGNOSIS — M6281 Muscle weakness (generalized): Secondary | ICD-10-CM | POA: Diagnosis not present

## 2022-03-27 DIAGNOSIS — F0390 Unspecified dementia without behavioral disturbance: Secondary | ICD-10-CM | POA: Diagnosis not present

## 2022-03-30 DIAGNOSIS — F0390 Unspecified dementia without behavioral disturbance: Secondary | ICD-10-CM | POA: Diagnosis not present

## 2022-03-30 DIAGNOSIS — R296 Repeated falls: Secondary | ICD-10-CM | POA: Diagnosis not present

## 2022-03-30 DIAGNOSIS — R2681 Unsteadiness on feet: Secondary | ICD-10-CM | POA: Diagnosis not present

## 2022-03-30 DIAGNOSIS — M6281 Muscle weakness (generalized): Secondary | ICD-10-CM | POA: Diagnosis not present

## 2022-03-31 DIAGNOSIS — R296 Repeated falls: Secondary | ICD-10-CM | POA: Diagnosis not present

## 2022-03-31 DIAGNOSIS — R2681 Unsteadiness on feet: Secondary | ICD-10-CM | POA: Diagnosis not present

## 2022-03-31 DIAGNOSIS — M6281 Muscle weakness (generalized): Secondary | ICD-10-CM | POA: Diagnosis not present

## 2022-03-31 DIAGNOSIS — F0390 Unspecified dementia without behavioral disturbance: Secondary | ICD-10-CM | POA: Diagnosis not present

## 2022-04-01 DIAGNOSIS — R2681 Unsteadiness on feet: Secondary | ICD-10-CM | POA: Diagnosis not present

## 2022-04-01 DIAGNOSIS — M6281 Muscle weakness (generalized): Secondary | ICD-10-CM | POA: Diagnosis not present

## 2022-04-01 DIAGNOSIS — F0390 Unspecified dementia without behavioral disturbance: Secondary | ICD-10-CM | POA: Diagnosis not present

## 2022-04-01 DIAGNOSIS — R296 Repeated falls: Secondary | ICD-10-CM | POA: Diagnosis not present

## 2022-04-02 DIAGNOSIS — R2681 Unsteadiness on feet: Secondary | ICD-10-CM | POA: Diagnosis not present

## 2022-04-02 DIAGNOSIS — M6281 Muscle weakness (generalized): Secondary | ICD-10-CM | POA: Diagnosis not present

## 2022-04-02 DIAGNOSIS — R296 Repeated falls: Secondary | ICD-10-CM | POA: Diagnosis not present

## 2022-04-02 DIAGNOSIS — F0390 Unspecified dementia without behavioral disturbance: Secondary | ICD-10-CM | POA: Diagnosis not present

## 2022-04-03 DIAGNOSIS — R296 Repeated falls: Secondary | ICD-10-CM | POA: Diagnosis not present

## 2022-04-03 DIAGNOSIS — F0390 Unspecified dementia without behavioral disturbance: Secondary | ICD-10-CM | POA: Diagnosis not present

## 2022-04-03 DIAGNOSIS — M6281 Muscle weakness (generalized): Secondary | ICD-10-CM | POA: Diagnosis not present

## 2022-04-03 DIAGNOSIS — R2681 Unsteadiness on feet: Secondary | ICD-10-CM | POA: Diagnosis not present

## 2022-04-06 DIAGNOSIS — R2681 Unsteadiness on feet: Secondary | ICD-10-CM | POA: Diagnosis not present

## 2022-04-06 DIAGNOSIS — M6281 Muscle weakness (generalized): Secondary | ICD-10-CM | POA: Diagnosis not present

## 2022-04-06 DIAGNOSIS — R296 Repeated falls: Secondary | ICD-10-CM | POA: Diagnosis not present

## 2022-04-06 DIAGNOSIS — F0390 Unspecified dementia without behavioral disturbance: Secondary | ICD-10-CM | POA: Diagnosis not present

## 2022-04-07 DIAGNOSIS — M6281 Muscle weakness (generalized): Secondary | ICD-10-CM | POA: Diagnosis not present

## 2022-04-07 DIAGNOSIS — R2681 Unsteadiness on feet: Secondary | ICD-10-CM | POA: Diagnosis not present

## 2022-04-07 DIAGNOSIS — R296 Repeated falls: Secondary | ICD-10-CM | POA: Diagnosis not present

## 2022-04-07 DIAGNOSIS — F0390 Unspecified dementia without behavioral disturbance: Secondary | ICD-10-CM | POA: Diagnosis not present

## 2022-04-09 DIAGNOSIS — F0390 Unspecified dementia without behavioral disturbance: Secondary | ICD-10-CM | POA: Diagnosis not present

## 2022-04-09 DIAGNOSIS — R2681 Unsteadiness on feet: Secondary | ICD-10-CM | POA: Diagnosis not present

## 2022-04-09 DIAGNOSIS — R296 Repeated falls: Secondary | ICD-10-CM | POA: Diagnosis not present

## 2022-04-09 DIAGNOSIS — M6281 Muscle weakness (generalized): Secondary | ICD-10-CM | POA: Diagnosis not present

## 2022-04-10 DIAGNOSIS — M6281 Muscle weakness (generalized): Secondary | ICD-10-CM | POA: Diagnosis not present

## 2022-04-10 DIAGNOSIS — R296 Repeated falls: Secondary | ICD-10-CM | POA: Diagnosis not present

## 2022-04-10 DIAGNOSIS — F0390 Unspecified dementia without behavioral disturbance: Secondary | ICD-10-CM | POA: Diagnosis not present

## 2022-04-10 DIAGNOSIS — R2681 Unsteadiness on feet: Secondary | ICD-10-CM | POA: Diagnosis not present

## 2022-04-13 ENCOUNTER — Non-Acute Institutional Stay (SKILLED_NURSING_FACILITY): Payer: Medicare Other | Admitting: Orthopedic Surgery

## 2022-04-13 ENCOUNTER — Encounter: Payer: Self-pay | Admitting: Orthopedic Surgery

## 2022-04-13 DIAGNOSIS — R2681 Unsteadiness on feet: Secondary | ICD-10-CM | POA: Diagnosis not present

## 2022-04-13 DIAGNOSIS — R270 Ataxia, unspecified: Secondary | ICD-10-CM

## 2022-04-13 DIAGNOSIS — E1149 Type 2 diabetes mellitus with other diabetic neurological complication: Secondary | ICD-10-CM | POA: Diagnosis not present

## 2022-04-13 DIAGNOSIS — E1169 Type 2 diabetes mellitus with other specified complication: Secondary | ICD-10-CM

## 2022-04-13 DIAGNOSIS — M810 Age-related osteoporosis without current pathological fracture: Secondary | ICD-10-CM

## 2022-04-13 DIAGNOSIS — R911 Solitary pulmonary nodule: Secondary | ICD-10-CM

## 2022-04-13 DIAGNOSIS — E785 Hyperlipidemia, unspecified: Secondary | ICD-10-CM

## 2022-04-13 DIAGNOSIS — I10 Essential (primary) hypertension: Secondary | ICD-10-CM

## 2022-04-13 DIAGNOSIS — R634 Abnormal weight loss: Secondary | ICD-10-CM

## 2022-04-13 DIAGNOSIS — F0390 Unspecified dementia without behavioral disturbance: Secondary | ICD-10-CM | POA: Diagnosis not present

## 2022-04-13 DIAGNOSIS — R419 Unspecified symptoms and signs involving cognitive functions and awareness: Secondary | ICD-10-CM | POA: Diagnosis not present

## 2022-04-13 DIAGNOSIS — D692 Other nonthrombocytopenic purpura: Secondary | ICD-10-CM

## 2022-04-13 DIAGNOSIS — R32 Unspecified urinary incontinence: Secondary | ICD-10-CM

## 2022-04-13 DIAGNOSIS — M6281 Muscle weakness (generalized): Secondary | ICD-10-CM | POA: Diagnosis not present

## 2022-04-13 DIAGNOSIS — R296 Repeated falls: Secondary | ICD-10-CM | POA: Diagnosis not present

## 2022-04-13 DIAGNOSIS — Z8673 Personal history of transient ischemic attack (TIA), and cerebral infarction without residual deficits: Secondary | ICD-10-CM

## 2022-04-13 NOTE — Progress Notes (Signed)
Location:  [facility] Room Number: N04/A Place of Service:  SNF [phone]) Provider: Octavia Heir, NP  Patient Care Team: Mahlon Gammon, MD as PCP - General (Internal Medicine) [name], DO as Consulting Physician (Neurology)  Extended Emergency Contact Information Primary Emergency Contact: [name],[name] Address: [address]          [place] [address] [place] Home Phone: [phone] Relation: Sister Secondary Emergency Contact: [facility] Address: [address] [place], [place] [address] [name] Mobile Phone: [phone] Relation: Brother  Code Status:  DNR Goals of care: Advanced Directive information    04/13/2022    9:45 AM  Advanced Directives  Does Patient Have a Medical Advance Directive? Yes  Type of Estate agent of [place];Living will;Out of facility DNR (pink MOST or yellow form)  Does patient want to make changes to medical advance directive? No - Patient declined  Copy of Healthcare Power of Attorney in Chart? Yes - validated most recent copy scanned in chart (See row information)  Pre-existing out of facility DNR order (yellow form or pink MOST form) Pink MOST/Yellow Form most recent copy in chart - Physician notified to receive inpatient order     Chief Complaint  Patient presents with   Medical Management of Chronic Issues    Routine visit.   Quality Metric Gaps    Discuss the need for Hep C screening and Shingrix vaccine, or post pone if patient refuses.    HPI:  [name] seen today for medical management of chronic diseases.    She currently resides on the skilled nursing unit at [facility]. Past medical history includes: HTN, T2DM, constipation, neurocognitive disorder, osteoporosis, dyslipidemia, hx of lung nodule, lung nodule, and abnormal gait.   Weight loss- see weights below, remains on milkshakes twice weekly T2DM- A1c 6.9 (2021),  6.7 02/14/2021, 8.2 10/16/2021, 7.1 (01/15/2022) no recent hypoglycemic events, blood sugars averaging 100-150's, remains on metformin, regular diet Neurocognitive disorder- BIMS 8 12/09/2021, requiring more assistance with ADLs, less talkative, no behavioral outbursts HTN- BUN/creat 21/0.81 03/18/2022, off medications at this time Osteoporosis- past use of Fosamax, family does not want further bone density tests, remains on calcium and vitamin D daily Lung nodule- 01/13/2021 2.5 mm nodule to right lung noted, 5 mm nodule to right shoulder also found by CT scan, watch/wait per ortho, CXR in future 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 Ambulates in wheelchair.   Recent blood pressures:  05/30- 116/60  05/23- 116/67  05/16- 127/60  Recent weights:  06/01- 134.1 lbs  05/03- 138.2 lbs  04/06- 142.9 lbs   Past Medical History:  Diagnosis Date   Diabetes mellitus    Dyslipidemia    Hypertension    Osteoporosis    Urinary incontinence    History reviewed. No pertinent surgical history.  Allergies  Allergen Reactions   Amoxicillin-Pot Clavulanate Hives   Meloxicam     Unknown reaction   Scallops [Shellfish Allergy] Other (See Comments)    "I don't feel so good after an hour"   Toviaz [Fesoterodine Fumarate Er]     Unknown reaction    Outpatient Encounter Medications as of 04/13/2022  Medication Sig   acetaminophen (TYLENOL)  325 MG tablet Take 650 mg by mouth 2 (two) times daily.   atorvastatin (LIPITOR) 40 MG tablet Take 1 tablet (40 mg total) by mouth daily.   Calcium Carbonate-Vitamin D (CALTRATE 600+D PO) Take 1 tablet by mouth 2 (two) times daily.   cholecalciferol (VITAMIN D3) 25 MCG (1000 UNIT) tablet Take 1,000 Units by mouth daily.   clopidogrel (PLAVIX) 75 MG tablet Take 75 mg by mouth  daily.   metFORMIN (GLUCOPHAGE) 1000 MG tablet Take 1,000 mg by mouth 2 (two) times daily with a meal.   Multiple Vitamins-Minerals (CENTRUM SILVER PO) Take 1 tablet by mouth daily.   oxybutynin (DITROPAN-XL) 5 MG 24 hr tablet Take 5 mg by mouth at bedtime.   sennosides-docusate sodium (SENOKOT-S) 8.6-50 MG tablet Take 1 tablet by mouth in the morning and at bedtime.   zinc oxide 20 % ointment Apply 1 application topically as needed for irritation.   No facility-administered encounter medications on file as of 04/13/2022.    Review of Systems  Unable to perform ROS: Dementia   Immunization History  Administered Date(s) Administered   Influenza Inj Mdck Quad Pf 08/10/2019   Influenza-Unspecified 08/13/2020, 09/02/2021   Moderna SARS-COV2 Booster Vaccination 04/16/2021   Moderna Sars-Covid-2 Vaccination 12/11/2019, 01/08/2020   PNEUMOCOCCAL CONJUGATE-20 01/08/2022   Tdap 01/07/2022   Unspecified SARS-COV-2 Vaccination 09/23/2020, 07/30/2021   Zoster Recombinat (Shingrix) 07/01/2020   Zoster, Live 07/01/2020   Pertinent  Health Maintenance Due  Topic Date Due   INFLUENZA VACCINE  06/09/2022   HEMOGLOBIN A1C  07/18/2022   FOOT EXAM  08/20/2022   OPHTHALMOLOGY EXAM  12/04/2022   URINE MICROALBUMIN  01/11/2023   DEXA SCAN  Completed      07/07/2020    8:37 PM 07/08/2020    2:54 AM 07/08/2020   10:36 AM 07/10/2020   10:51 AM 06/25/2021    4:10 PM  Fall Risk  Falls in the past year?     1  Was there an injury with Fall?     0  Fall Risk Category Calculator     1  Fall Risk Category     Low  Patient Fall Risk Level High fall risk Low fall risk Moderate fall risk Moderate fall risk Moderate fall risk  Patient at Risk for Falls Due to     History of fall(s);Impaired balance/gait;Impaired mobility  Fall risk Follow up     Falls evaluation completed;Education provided;Falls prevention discussed   Functional Status Survey:    Vitals:   04/13/22 0942  BP: 116/60  Pulse: 96  Resp: 20   Temp: (!) 97.2 F (36.2 C)  SpO2: 96%  Weight: 134 lb 1.6 oz (60.8 kg)  Height: 5\' 3"  (1.6 m)   Body mass index is 23.75 kg/m. Physical Exam Constitutional:      General: She is not in acute distress. HENT:     Head: Normocephalic.     Right Ear: There is no impacted cerumen.     Left Ear: There is no impacted cerumen.     Nose: Nose normal.     Mouth/Throat:     Mouth: Mucous membranes are moist.  Eyes:     General:        Right eye: No discharge.        Left eye: No discharge.  Cardiovascular:     Rate and Rhythm: Normal rate and regular rhythm.     Pulses: Normal pulses.     Heart sounds: Normal heart sounds.  Pulmonary:  Effort: Pulmonary effort is normal. No respiratory distress.     Breath sounds: Normal breath sounds. No wheezing.  Abdominal:     General: Bowel sounds are normal. There is no distension.     Palpations: Abdomen is soft.     Tenderness: There is no abdominal tenderness.  Musculoskeletal:     Cervical back: Neck supple.     Right lower leg: No edema.     Left lower leg: No edema.  Skin:    General: Skin is warm and dry.     Capillary Refill: Capillary refill takes less than 2 seconds.     Comments: Non tender purple lesions to upper extremities, vary in size, no skin breakdown  Neurological:     General: No focal deficit present.     Mental Status: She is alert. Mental status is at baseline.     Motor: Weakness present.     Gait: Gait abnormal.     Comments: Wheelchair  Psychiatric:        Mood and Affect: Mood normal.        Behavior: Behavior normal.        Cognition and Memory: Memory is impaired.     Comments: Very pleasant, follows commands, cannot complete sentence, alert to self/person    Labs reviewed: Recent Labs    10/16/21 0000 02/09/22 0000 03/18/22 0000  NA 139 136* 138  K 3.9 3.9 4.2  CL 104 100 101  CO2 24* 27* 25*  BUN 16 16 21   CREATININE 0.8 0.8 0.8  CALCIUM 8.9 9.1 10.6   Recent Labs    10/16/21 0000  02/09/22 0000  AST 12* 20  ALT 11 18  ALKPHOS 37 49  ALBUMIN 3.6 4.0   Recent Labs    10/16/21 0000 02/09/22 0000 03/18/22 0000  WBC  --  6.5 6.3  NEUTROABS 2,576.00 5,434.00 3,944.00  HGB 11.9* 12.8 14.2  HCT 35* 38 42  PLT 224 204 343   Lab Results  Component Value Date   TSH 1.14 10/16/2021   Lab Results  Component Value Date   HGBA1C 7.1 01/15/2022   Lab Results  Component Value Date   CHOL 131 10/16/2021   HDL 37 10/16/2021   LDLCALC 67 10/16/2021   TRIG 196 (A) 10/16/2021   CHOLHDL 4.5 08/20/2019    Significant Diagnostic Results in last 30 days:  No results found.  Assessment/Plan 1. Weight loss - trending down - cont monthly weights  2. Type 2 diabetes mellitus with other neurologic complication, without long-term current use of insulin (HCC) - A1c stable - cont metformin  3. Neurocognitive disorder - talking less, requiring more assistance with ADLs - cont skilled nursing unit  4. Primary hypertension - controlled without medication  5. Age related osteoporosis, unspecified pathological fracture presence - cont Caltrate  6. Lung nodule - past CXR indicated 2 benign nodules - f/u CXR - will order today-CXR 06/06 no acute cardiopulmonary findings/ left lung infiltrate resolved/nodules not identified  7. Ataxia - cont skilled nursing care  8. Hyperlipidemia associated with type 2 diabetes mellitus (HCC) - cont statin  9. History of TIA (transient ischemic attack) - cont Plavix and statin  10. 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

## 2022-04-14 DIAGNOSIS — R6889 Other general symptoms and signs: Secondary | ICD-10-CM | POA: Diagnosis not present

## 2022-04-14 DIAGNOSIS — R2681 Unsteadiness on feet: Secondary | ICD-10-CM | POA: Diagnosis not present

## 2022-04-14 DIAGNOSIS — M6281 Muscle weakness (generalized): Secondary | ICD-10-CM | POA: Diagnosis not present

## 2022-04-14 DIAGNOSIS — F0390 Unspecified dementia without behavioral disturbance: Secondary | ICD-10-CM | POA: Diagnosis not present

## 2022-04-14 DIAGNOSIS — R296 Repeated falls: Secondary | ICD-10-CM | POA: Diagnosis not present

## 2022-04-15 DIAGNOSIS — M6281 Muscle weakness (generalized): Secondary | ICD-10-CM | POA: Diagnosis not present

## 2022-04-15 DIAGNOSIS — F0390 Unspecified dementia without behavioral disturbance: Secondary | ICD-10-CM | POA: Diagnosis not present

## 2022-04-15 DIAGNOSIS — R296 Repeated falls: Secondary | ICD-10-CM | POA: Diagnosis not present

## 2022-04-15 DIAGNOSIS — R2681 Unsteadiness on feet: Secondary | ICD-10-CM | POA: Diagnosis not present

## 2022-04-16 DIAGNOSIS — M6281 Muscle weakness (generalized): Secondary | ICD-10-CM | POA: Diagnosis not present

## 2022-04-16 DIAGNOSIS — R296 Repeated falls: Secondary | ICD-10-CM | POA: Diagnosis not present

## 2022-04-16 DIAGNOSIS — F0390 Unspecified dementia without behavioral disturbance: Secondary | ICD-10-CM | POA: Diagnosis not present

## 2022-04-16 DIAGNOSIS — R2681 Unsteadiness on feet: Secondary | ICD-10-CM | POA: Diagnosis not present

## 2022-04-20 DIAGNOSIS — M6281 Muscle weakness (generalized): Secondary | ICD-10-CM | POA: Diagnosis not present

## 2022-04-20 DIAGNOSIS — R296 Repeated falls: Secondary | ICD-10-CM | POA: Diagnosis not present

## 2022-04-20 DIAGNOSIS — R2681 Unsteadiness on feet: Secondary | ICD-10-CM | POA: Diagnosis not present

## 2022-04-20 DIAGNOSIS — F0390 Unspecified dementia without behavioral disturbance: Secondary | ICD-10-CM | POA: Diagnosis not present

## 2022-04-21 DIAGNOSIS — R296 Repeated falls: Secondary | ICD-10-CM | POA: Diagnosis not present

## 2022-04-21 DIAGNOSIS — R2681 Unsteadiness on feet: Secondary | ICD-10-CM | POA: Diagnosis not present

## 2022-04-21 DIAGNOSIS — M6281 Muscle weakness (generalized): Secondary | ICD-10-CM | POA: Diagnosis not present

## 2022-04-21 DIAGNOSIS — F0390 Unspecified dementia without behavioral disturbance: Secondary | ICD-10-CM | POA: Diagnosis not present

## 2022-04-23 DIAGNOSIS — F0390 Unspecified dementia without behavioral disturbance: Secondary | ICD-10-CM | POA: Diagnosis not present

## 2022-04-23 DIAGNOSIS — R296 Repeated falls: Secondary | ICD-10-CM | POA: Diagnosis not present

## 2022-04-23 DIAGNOSIS — M6281 Muscle weakness (generalized): Secondary | ICD-10-CM | POA: Diagnosis not present

## 2022-04-23 DIAGNOSIS — R2681 Unsteadiness on feet: Secondary | ICD-10-CM | POA: Diagnosis not present

## 2022-04-24 DIAGNOSIS — R2681 Unsteadiness on feet: Secondary | ICD-10-CM | POA: Diagnosis not present

## 2022-04-24 DIAGNOSIS — M6281 Muscle weakness (generalized): Secondary | ICD-10-CM | POA: Diagnosis not present

## 2022-04-24 DIAGNOSIS — F0390 Unspecified dementia without behavioral disturbance: Secondary | ICD-10-CM | POA: Diagnosis not present

## 2022-04-24 DIAGNOSIS — R296 Repeated falls: Secondary | ICD-10-CM | POA: Diagnosis not present

## 2022-04-27 DIAGNOSIS — R2681 Unsteadiness on feet: Secondary | ICD-10-CM | POA: Diagnosis not present

## 2022-04-27 DIAGNOSIS — R296 Repeated falls: Secondary | ICD-10-CM | POA: Diagnosis not present

## 2022-04-27 DIAGNOSIS — F0390 Unspecified dementia without behavioral disturbance: Secondary | ICD-10-CM | POA: Diagnosis not present

## 2022-04-27 DIAGNOSIS — M6281 Muscle weakness (generalized): Secondary | ICD-10-CM | POA: Diagnosis not present

## 2022-04-28 DIAGNOSIS — F0390 Unspecified dementia without behavioral disturbance: Secondary | ICD-10-CM | POA: Diagnosis not present

## 2022-04-28 DIAGNOSIS — R296 Repeated falls: Secondary | ICD-10-CM | POA: Diagnosis not present

## 2022-04-28 DIAGNOSIS — R2681 Unsteadiness on feet: Secondary | ICD-10-CM | POA: Diagnosis not present

## 2022-04-28 DIAGNOSIS — M6281 Muscle weakness (generalized): Secondary | ICD-10-CM | POA: Diagnosis not present

## 2022-04-29 DIAGNOSIS — R296 Repeated falls: Secondary | ICD-10-CM | POA: Diagnosis not present

## 2022-04-29 DIAGNOSIS — M6281 Muscle weakness (generalized): Secondary | ICD-10-CM | POA: Diagnosis not present

## 2022-04-29 DIAGNOSIS — F0390 Unspecified dementia without behavioral disturbance: Secondary | ICD-10-CM | POA: Diagnosis not present

## 2022-04-29 DIAGNOSIS — R2681 Unsteadiness on feet: Secondary | ICD-10-CM | POA: Diagnosis not present

## 2022-04-30 DIAGNOSIS — R2681 Unsteadiness on feet: Secondary | ICD-10-CM | POA: Diagnosis not present

## 2022-04-30 DIAGNOSIS — F0390 Unspecified dementia without behavioral disturbance: Secondary | ICD-10-CM | POA: Diagnosis not present

## 2022-04-30 DIAGNOSIS — R296 Repeated falls: Secondary | ICD-10-CM | POA: Diagnosis not present

## 2022-04-30 DIAGNOSIS — M6281 Muscle weakness (generalized): Secondary | ICD-10-CM | POA: Diagnosis not present

## 2022-05-01 DIAGNOSIS — R296 Repeated falls: Secondary | ICD-10-CM | POA: Diagnosis not present

## 2022-05-01 DIAGNOSIS — F0390 Unspecified dementia without behavioral disturbance: Secondary | ICD-10-CM | POA: Diagnosis not present

## 2022-05-01 DIAGNOSIS — M6281 Muscle weakness (generalized): Secondary | ICD-10-CM | POA: Diagnosis not present

## 2022-05-01 DIAGNOSIS — R2681 Unsteadiness on feet: Secondary | ICD-10-CM | POA: Diagnosis not present

## 2022-05-04 DIAGNOSIS — R296 Repeated falls: Secondary | ICD-10-CM | POA: Diagnosis not present

## 2022-05-04 DIAGNOSIS — F0390 Unspecified dementia without behavioral disturbance: Secondary | ICD-10-CM | POA: Diagnosis not present

## 2022-05-04 DIAGNOSIS — M6281 Muscle weakness (generalized): Secondary | ICD-10-CM | POA: Diagnosis not present

## 2022-05-04 DIAGNOSIS — R2681 Unsteadiness on feet: Secondary | ICD-10-CM | POA: Diagnosis not present

## 2022-05-05 DIAGNOSIS — F0390 Unspecified dementia without behavioral disturbance: Secondary | ICD-10-CM | POA: Diagnosis not present

## 2022-05-05 DIAGNOSIS — R2681 Unsteadiness on feet: Secondary | ICD-10-CM | POA: Diagnosis not present

## 2022-05-05 DIAGNOSIS — R296 Repeated falls: Secondary | ICD-10-CM | POA: Diagnosis not present

## 2022-05-05 DIAGNOSIS — M6281 Muscle weakness (generalized): Secondary | ICD-10-CM | POA: Diagnosis not present

## 2022-05-07 DIAGNOSIS — F0390 Unspecified dementia without behavioral disturbance: Secondary | ICD-10-CM | POA: Diagnosis not present

## 2022-05-07 DIAGNOSIS — R2681 Unsteadiness on feet: Secondary | ICD-10-CM | POA: Diagnosis not present

## 2022-05-07 DIAGNOSIS — M6281 Muscle weakness (generalized): Secondary | ICD-10-CM | POA: Diagnosis not present

## 2022-05-07 DIAGNOSIS — R296 Repeated falls: Secondary | ICD-10-CM | POA: Diagnosis not present

## 2022-05-08 DIAGNOSIS — F0390 Unspecified dementia without behavioral disturbance: Secondary | ICD-10-CM | POA: Diagnosis not present

## 2022-05-08 DIAGNOSIS — R2681 Unsteadiness on feet: Secondary | ICD-10-CM | POA: Diagnosis not present

## 2022-05-08 DIAGNOSIS — R296 Repeated falls: Secondary | ICD-10-CM | POA: Diagnosis not present

## 2022-05-08 DIAGNOSIS — M6281 Muscle weakness (generalized): Secondary | ICD-10-CM | POA: Diagnosis not present

## 2022-05-09 ENCOUNTER — Emergency Department (HOSPITAL_COMMUNITY): Payer: Medicare Other

## 2022-05-09 ENCOUNTER — Emergency Department (HOSPITAL_COMMUNITY)
Admission: EM | Admit: 2022-05-09 | Discharge: 2022-05-09 | Disposition: A | Discharge status: Skilled Nursing Facility | Payer: Medicare Other | Attending: Emergency Medicine | Admitting: Emergency Medicine

## 2022-05-09 DIAGNOSIS — F0390 Unspecified dementia without behavioral disturbance: Secondary | ICD-10-CM | POA: Diagnosis not present

## 2022-05-09 DIAGNOSIS — Z7902 Long term (current) use of antithrombotics/antiplatelets: Secondary | ICD-10-CM | POA: Diagnosis not present

## 2022-05-09 DIAGNOSIS — I6523 Occlusion and stenosis of bilateral carotid arteries: Secondary | ICD-10-CM | POA: Diagnosis not present

## 2022-05-09 DIAGNOSIS — Z043 Encounter for examination and observation following other accident: Secondary | ICD-10-CM | POA: Diagnosis not present

## 2022-05-09 DIAGNOSIS — R410 Disorientation, unspecified: Secondary | ICD-10-CM | POA: Diagnosis not present

## 2022-05-09 DIAGNOSIS — S199XXA Unspecified injury of neck, initial encounter: Secondary | ICD-10-CM | POA: Diagnosis not present

## 2022-05-09 DIAGNOSIS — I10 Essential (primary) hypertension: Secondary | ICD-10-CM | POA: Diagnosis not present

## 2022-05-09 DIAGNOSIS — S0990XA Unspecified injury of head, initial encounter: Secondary | ICD-10-CM | POA: Diagnosis not present

## 2022-05-09 DIAGNOSIS — W19XXXA Unspecified fall, initial encounter: Secondary | ICD-10-CM | POA: Diagnosis not present

## 2022-05-09 LAB — CBG MONITORING, ED: Glucose-Capillary: 141 mg/dL — ABNORMAL HIGH (ref 70–99)

## 2022-05-09 NOTE — ED Provider Notes (Signed)
[facility] EMERGENCY DEPARTMENT Provider Note   CSN: [id number] Arrival date & time: 05/09/22  1912     History  Chief Complaint  Patient presents with   Marletta Lor    Jaclyn Guzman is a 81 y.o. female presenting by EMS from her memory care facility with concern for a possible fall.  Patient found by her wheelchair earlier today.  This is a friend's [place] home.  Initially she was behaving normally per staff, but there was concern that she was more sluggish and not following commands later in the evening, subsequently she was brought into the ED.  The patient is on Plavix.  She is pleasantly demented and does not report any complaints or pain  HPI     Home Medications Prior to Admission medications   Medication Sig Start Date End Date Taking? Authorizing Provider  acetaminophen (TYLENOL) 325 MG tablet Take 650 mg by mouth 2 (two) times daily.    [provider]  atorvastatin (LIPITOR) 40 MG tablet Take 1 tablet (40 mg total) by mouth daily. 08/21/19   Joseph Art, DO  Calcium Carbonate-Vitamin D (CALTRATE 600+D PO) Take 1 tablet by mouth 2 (two) times daily.    [provider]  cholecalciferol (VITAMIN D3) 25 MCG (1000 UNIT) tablet Take 1,000 Units by mouth daily.    [provider]  clopidogrel (PLAVIX) 75 MG tablet Take 75 mg by mouth daily.    [provider]  metFORMIN (GLUCOPHAGE) 1000 MG tablet Take 1,000 mg by mouth 2 (two) times daily with a meal.    [provider]  Multiple Vitamins-Minerals (CENTRUM SILVER PO) Take 1 tablet by mouth daily.    [provider]  oxybutynin (DITROPAN-XL) 5 MG 24 hr tablet Take 5 mg by mouth at bedtime.    [provider]  sennosides-docusate sodium (SENOKOT-S) 8.6-50 MG tablet Take 1 tablet by mouth in the morning and at bedtime.    [provider]  zinc oxide 20 % ointment Apply 1 application topically as needed for irritation.    [provider]       Allergies    Amoxicillin-pot clavulanate, Meloxicam, Scallops [shellfish allergy], and Toviaz [fesoterodine fumarate er]    Review of Systems   Review of Systems  Physical Exam Updated Vital Signs BP 133/68 (BP Location: Right Arm)   Pulse 88   Temp 98.2 F (36.8 C) (Oral)   Resp 20   SpO2 97%  Physical Exam Constitutional:      General: She is not in acute distress. HENT:     Head: Normocephalic and atraumatic.  Eyes:     Conjunctiva/sclera: Conjunctivae normal.     Pupils: Pupils are equal, round, and reactive to light.  Cardiovascular:     Rate and Rhythm: Normal rate and regular rhythm.  Pulmonary:     Effort: Pulmonary effort is normal. No respiratory distress.  Abdominal:     General: There is no distension.     Tenderness: There is no abdominal tenderness.  Skin:    General: Skin is warm and dry.  Neurological:     General: No focal deficit present.     Mental Status: She is alert. Mental status is at baseline.  Psychiatric:        Mood and Affect: Mood normal.        Behavior: Behavior normal.     ED Results / Procedures / Treatments   Labs (all labs ordered are listed, but only abnormal  results are displayed) Labs Reviewed  CBG MONITORING, ED - Abnormal; Notable for the following components:      Result Value   Glucose-Capillary 141 (*)    All other components within normal limits    EKG EKG Interpretation  Date/Time:  Saturday May 09 2022 19:24:19 EDT Ventricular Rate:  90 PR Interval:  214 QRS Duration: 91 QT Interval:  342 QTC Calculation: 419 R Axis:   72 Text Interpretation: Sinus rhythm Borderline prolonged PR interval Low voltage, precordial leads Confirmed by Alvester Chou [phone]) on 05/09/2022 8:37:14 PM  Radiology CT HEAD WO CONTRAST ( )  Result Date: 05/09/2022 CLINICAL DATA:  Head trauma, moderate-severe; Neck trauma, intoxicated or obtunded (Age >= 16y) Pt suffered unwitnessed fall from wheelchair to ground found down by  staff. EXAM: CT HEAD WITHOUT CONTRAST CT CERVICAL SPINE WITHOUT CONTRAST TECHNIQUE: Multidetector CT imaging of the head and cervical spine was performed following the standard protocol without intravenous contrast. Multiplanar CT image reconstructions of the cervical spine were also generated. RADIATION DOSE REDUCTION: This exam was performed according to the departmental dose-optimization program which includes automated exposure control, adjustment of the mA and/or kV according to patient size and/or use of iterative reconstruction technique. COMPARISON:  CT head and cervical spine 07/06/2020 FINDINGS: CT HEAD FINDINGS BRAIN: BRAIN Cerebral ventricle sizes are concordant with the degree of cerebral volume loss. Patchy and confluent areas of decreased attenuation are noted throughout the deep and periventricular white matter of the cerebral hemispheres bilaterally, compatible with chronic microvascular ischemic disease. No evidence of large-territorial acute infarction. No parenchymal hemorrhage. No mass lesion. No extra-axial collection. No mass effect or midline shift. No hydrocephalus. Basilar cisterns are patent. Vascular: No hyperdense vessel. Atherosclerotic calcifications are present within the cavernous internal carotid arteries. Skull: No acute fracture or focal lesion. Sinuses/Orbits: Paranasal sinuses and mastoid air cells are clear. Left lens replacement. Otherwise the orbits are unremarkable. Other: Carotid artery atherosclerotic plaque within the neck. CT CERVICAL SPINE FINDINGS Alignment: Normal. Skull base and vertebrae: Multilevel moderate degenerative changes of the spine with posterior disc osteophyte complex formation at the C5-C6 level. No acute fracture. No aggressive appearing focal osseous lesion or focal pathologic process. Soft tissues and spinal canal: No prevertebral fluid or swelling. No visible canal hematoma. Upper chest: Unremarkable. Other: Atherosclerotic plaque of the carotid  arteries within the neck. IMPRESSION: No acute intracranial abnormality. No acute displaced fracture or traumatic listhesis of the cervical spine. Electronically Signed   By: Tish Frederickson M.D.   On: 05/09/2022 20:26   CT Cervical Spine Wo Contrast  Result Date: 05/09/2022 CLINICAL DATA:  Head trauma, moderate-severe; Neck trauma, intoxicated or obtunded (Age >= 16y) Pt suffered unwitnessed fall from wheelchair to ground found down by staff. EXAM: CT HEAD WITHOUT CONTRAST CT CERVICAL SPINE WITHOUT CONTRAST TECHNIQUE: Multidetector CT imaging of the head and cervical spine was performed following the standard protocol without intravenous contrast. Multiplanar CT image reconstructions of the cervical spine were also generated. RADIATION DOSE REDUCTION: This exam was performed according to the departmental dose-optimization program which includes automated exposure control, adjustment of the mA and/or kV according to patient size and/or use of iterative reconstruction technique. COMPARISON:  CT head and cervical spine 07/06/2020 FINDINGS: CT HEAD FINDINGS BRAIN: BRAIN Cerebral ventricle sizes are concordant with the degree of cerebral volume loss. Patchy and confluent areas of decreased attenuation are noted throughout the deep and periventricular white matter of the cerebral hemispheres bilaterally, compatible with chronic microvascular ischemic disease. No evidence of large-territorial  acute infarction. No parenchymal hemorrhage. No mass lesion. No extra-axial collection. No mass effect or midline shift. No hydrocephalus. Basilar cisterns are patent. Vascular: No hyperdense vessel. Atherosclerotic calcifications are present within the cavernous internal carotid arteries. Skull: No acute fracture or focal lesion. Sinuses/Orbits: Paranasal sinuses and mastoid air cells are clear. Left lens replacement. Otherwise the orbits are unremarkable. Other: Carotid artery atherosclerotic plaque within the neck. CT CERVICAL  SPINE FINDINGS Alignment: Normal. Skull base and vertebrae: Multilevel moderate degenerative changes of the spine with posterior disc osteophyte complex formation at the C5-C6 level. No acute fracture. No aggressive appearing focal osseous lesion or focal pathologic process. Soft tissues and spinal canal: No prevertebral fluid or swelling. No visible canal hematoma. Upper chest: Unremarkable. Other: Atherosclerotic plaque of the carotid arteries within the neck. IMPRESSION: No acute intracranial abnormality. No acute displaced fracture or traumatic listhesis of the cervical spine. Electronically Signed   By: Tish Frederickson M.D.   On: 05/09/2022 20:26    Procedures Procedures    Medications Ordered in ED Medications - No data to display  ED Course/ Medical Decision Making/ A&P Clinical Course as of 05/09/22 2048  Sat May 09, 2022  2043 Patient sister and brother present at bedside and updated regarding work-up and plan for discharge. [MT]    Clinical Course User Index [MT] Criss Pallone, Kermit Balo, MD                           Medical Decision Making Amount and/or Complexity of Data Reviewed Radiology: ordered. ECG/medicine tests: ordered.   Patient is here with possible fall at her care facility.  She has dementia.  No evidence of significant trauma on exam.  CT imaging of the head and cervical spine does not show any acute traumatic injuries.  EKG is normal sinus rhythm per my interpretation.  Glucose within normal limits.  I think she is reasonably stable for discharge back to facility. Patient is ambulance transport home.       Final Clinical Impression(s) / ED Diagnoses Final diagnoses:  Fall, initial encounter    Rx / DC Orders ED Discharge Orders     None         Tearsa Kowalewski, Kermit Balo, MD 05/09/22 2048

## 2022-05-09 NOTE — Discharge Instructions (Signed)
CT scan of the head and cervical spine did not show any acute traumatic injuries or brain bleed.

## 2022-05-09 NOTE — ED Notes (Signed)
Ccollar removed by Renaye Rakers MD.

## 2022-05-09 NOTE — ED Triage Notes (Signed)
Pt arrived via [facility] for cc of fall from [facility]. Pt on Plavix, no trauma noted. Pt suffered unwitnessed fall from wheelchair to ground found down by staff. Pt was assisted back to bed and was monitored by staff with no noted or reported injury by patient. Pt has dementia with baseline orientation is following commands and oriented to self only. Staff called EMS when pt became slow to follow commands and reported sluggish pupillary response. Upon EMS arrival, pt oriented to baseline - following commands, pupils equal and reactive to light, oriented to self. Moves all extremities with equal strength and movement with no indications of discomfort. GCS 14, A&Ox1. Ccollar in place at time of arrival.  PTA Vitals   118/60 84 95%  14 Temp 97.6 CBG 155

## 2022-05-11 ENCOUNTER — Non-Acute Institutional Stay (SKILLED_NURSING_FACILITY): Payer: Medicare Other | Admitting: Orthopedic Surgery

## 2022-05-11 ENCOUNTER — Encounter: Payer: Self-pay | Admitting: Orthopedic Surgery

## 2022-05-11 DIAGNOSIS — R1312 Dysphagia, oropharyngeal phase: Secondary | ICD-10-CM | POA: Diagnosis not present

## 2022-05-11 DIAGNOSIS — R296 Repeated falls: Secondary | ICD-10-CM | POA: Diagnosis not present

## 2022-05-11 DIAGNOSIS — E1149 Type 2 diabetes mellitus with other diabetic neurological complication: Secondary | ICD-10-CM

## 2022-05-11 DIAGNOSIS — F0390 Unspecified dementia without behavioral disturbance: Secondary | ICD-10-CM | POA: Diagnosis not present

## 2022-05-11 DIAGNOSIS — R419 Unspecified symptoms and signs involving cognitive functions and awareness: Secondary | ICD-10-CM

## 2022-05-11 NOTE — Progress Notes (Signed)
Location:   [facility] Room Number: 4-A Place of Service:  SNF [phone]) Provider:  Hazle Nordmann, NP  PCP: Mahlon Gammon, MD  Patient Care Team: Mahlon Gammon, MD as PCP - General (Internal Medicine) [name], DO as Consulting Physician (Neurology)  Extended Emergency Contact Information Primary Emergency Contact: [name],[name] Address: [address]          [place] [address] [name] Home Phone: [phone] Relation: Sister Secondary Emergency Contact: [facility] Address: [address] [place], [place] [address] [name] Mobile Phone: [phone] Relation: Brother  Code Status:  DNR Goals of care: Advanced Directive information    05/11/2022   11:06 AM  Advanced Directives  Does Patient Have a Medical Advance Directive? Yes  Type of Estate agent of [place];Living will;Out of facility DNR (pink MOST or yellow form)  Does patient want to make changes to medical advance directive? No - Patient declined  Copy of Healthcare Power of Attorney in Chart? Yes - validated most recent copy scanned in chart (See row information)     Chief Complaint  Patient presents with   Acute Visit    HPI:  [name] seen today for f/u s/p ED visit 05/09/2022 at [facility].   07/01 she was found on the floor by staff. No apparent injury at the time, but a few hours later she appeared more drowsy. She was taken to the ED for further evaluation. CT head/ spine with no acute intracranial abnormality/fracture. CBG 141. Vitals stable. EKG sinus rhythm. She was discharged back to [facility].   Today, she is eating breakfast without difficulty. Nonverbal. She shakes her head up/down only. She does not appear to be in pain. She was able to follow my commands.   Neurocognitive disorder- BIMS 8 12/09/2021, requiring more assistance with ADLs, less talkative, no behavioral outbursts T2DM- A1c 6.9  (2021), 6.7 02/14/2021, 8.2 10/16/2021, 7.1 (01/15/2022) no recent hypoglycemic events, blood sugars averaging 100-150's, remains on metformin, regular diet  Past Medical History:  Diagnosis Date   Diabetes mellitus    Dyslipidemia    Hypertension    Osteoporosis    Urinary incontinence    History reviewed. No pertinent surgical history.  Allergies  Allergen Reactions   Augmentin [Amoxicillin-Pot Clavulanate] Hives   Evista [Raloxifene]    Meloxicam     Unknown reaction   Scallops [Shellfish Allergy] Other (See Comments)    "I don't feel so good after an hour"   Toviaz [Fesoterodine Fumarate Er]     Unknown reaction    Allergies as of 05/11/2022       Reactions   Augmentin [amoxicillin-pot Clavulanate] Hives   Evista [raloxifene]    Meloxicam    Unknown reaction   Scallops [shellfish Allergy] Other (See Comments)   "I don't feel so good after an hour"   Toviaz [fesoterodine Fumarate Er]    Unknown reaction        Medication List        Accurate as of May 11, 2022 11:48 AM. If you have any questions, ask your nurse or doctor.          acetaminophen 325 MG tablet Commonly known as: TYLENOL Take 650 mg by mouth 2 (two) times daily.   atorvastatin 40 MG tablet Commonly known as: LIPITOR Take 1 tablet (40 mg total) by mouth daily.  CALTRATE 600+D PO Take 1 tablet by mouth 2 (two) times daily.   CENTRUM SILVER PO Take 1 tablet by mouth daily.   cholecalciferol 25 MCG (1000 UNIT) tablet Commonly known as: VITAMIN D3 Take 1,000 Units by mouth daily.   clopidogrel 75 MG tablet Commonly known as: PLAVIX Take 75 mg by mouth daily.   metFORMIN 1000 MG tablet Commonly known as: GLUCOPHAGE Take 1,000 mg by mouth 2 (two) times daily with a meal.   oxybutynin 5 MG 24 hr tablet Commonly known as: DITROPAN-XL Take 5 mg by mouth at bedtime.   sennosides-docusate sodium 8.6-50 MG tablet Commonly known as: SENOKOT-S Take 1 tablet by mouth in the morning and  at bedtime.   zinc oxide 20 % ointment Apply 1 application topically as needed for irritation.        Review of Systems  Unable to perform ROS: Patient nonverbal    Immunization History  Administered Date(s) Administered   Influenza Inj Mdck Quad Pf 08/10/2019   Influenza-Unspecified 08/13/2020, 09/02/2021   Moderna SARS-COV2 Booster Vaccination 04/16/2021   Moderna Sars-Covid-2 Vaccination 12/11/2019, 01/08/2020   PNEUMOCOCCAL CONJUGATE-20 01/08/2022   Tdap 01/07/2022   Unspecified SARS-COV-2 Vaccination 09/23/2020, 07/30/2021   Zoster Recombinat (Shingrix) 07/01/2020   Zoster, Live 07/01/2020   Pertinent  Health Maintenance Due  Topic Date Due   INFLUENZA VACCINE  06/09/2022   HEMOGLOBIN A1C  07/18/2022   FOOT EXAM  08/20/2022   OPHTHALMOLOGY EXAM  12/04/2022   URINE MICROALBUMIN  01/11/2023   DEXA SCAN  Completed      07/07/2020    8:37 PM 07/08/2020    2:54 AM 07/08/2020   10:36 AM 07/10/2020   10:51 AM 06/25/2021    4:10 PM  Fall Risk  Falls in the past year?     1  Was there an injury with Fall?     0  Fall Risk Category Calculator     1  Fall Risk Category     Low  Patient Fall Risk Level High fall risk Low fall risk Moderate fall risk Moderate fall risk Moderate fall risk  Patient at Risk for Falls Due to     History of fall(s);Impaired balance/gait;Impaired mobility  Fall risk Follow up     Falls evaluation completed;Education provided;Falls prevention discussed   Functional Status Survey:    Vitals:   05/11/22 1103  BP: 140/72  Pulse: 74  Resp: 20  Temp: (!) 97.4 F (36.3 C)  SpO2: 97%  Weight: 136 lb 6.4 oz (61.9 kg)  Height: 5\' 3"  (1.6 m)   Body mass index is 24.16 kg/m. Physical Exam Vitals reviewed.  Constitutional:      General: She is not in acute distress. HENT:     Head: Normocephalic and atraumatic.  Eyes:     General:        Right eye: No discharge.        Left eye: No discharge.  Cardiovascular:     Rate and Rhythm: Normal  rate and regular rhythm.     Pulses: Normal pulses.     Heart sounds: Normal heart sounds.  Pulmonary:     Effort: Pulmonary effort is normal. No respiratory distress.     Breath sounds: Normal breath sounds. No wheezing.  Abdominal:     General: Bowel sounds are normal. There is no distension.     Palpations: Abdomen is soft.     Tenderness: There is no abdominal tenderness.  Musculoskeletal:     Cervical  back: Neck supple.     Right lower leg: No edema.     Left lower leg: No edema.  Skin:    General: Skin is warm and dry.     Capillary Refill: Capillary refill takes less than 2 seconds.  Neurological:     General: No focal deficit present.     Mental Status: She is alert. Mental status is at baseline.     Motor: Weakness present.     Gait: Gait abnormal.     Comments: Right hand contracture, slight hand tremor observed, wheelchair to ambulate  Psychiatric:        Mood and Affect: Mood normal.        Behavior: Behavior normal.        Cognition and Memory: Cognition is impaired. Memory is impaired.     Comments: Nonverbal, follows commands, alert to self     Labs reviewed: Recent Labs    10/16/21 0000 02/09/22 0000 03/18/22 0000  NA 139 136* 138  K 3.9 3.9 4.2  CL 104 100 101  CO2 24* 27* 25*  BUN 16 16 21   CREATININE 0.8 0.8 0.8  CALCIUM 8.9 9.1 10.6   Recent Labs    10/16/21 0000 02/09/22 0000  AST 12* 20  ALT 11 18  ALKPHOS 37 49  ALBUMIN 3.6 4.0   Recent Labs    10/16/21 0000 02/09/22 0000 03/18/22 0000  WBC  --  6.5 6.3  NEUTROABS 2,576.00 5,434.00 3,944.00  HGB 11.9* 12.8 14.2  HCT 35* 38 42  PLT 224 204 343   Lab Results  Component Value Date   TSH 1.14 10/16/2021   Lab Results  Component Value Date   HGBA1C 7.1 01/15/2022   Lab Results  Component Value Date   CHOL 131 10/16/2021   HDL 37 10/16/2021   LDLCALC 67 10/16/2021   TRIG 196 (A) 10/16/2021   CHOLHDL 4.5 08/20/2019    Significant Diagnostic Results in last 30 days:  CT  HEAD WO CONTRAST (10/20/2019)  Result Date: 05/09/2022 CLINICAL DATA:  Head trauma, moderate-severe; Neck trauma, intoxicated or obtunded (Age >= 16y) Pt suffered unwitnessed fall from wheelchair to ground found down by staff. EXAM: CT HEAD WITHOUT CONTRAST CT CERVICAL SPINE WITHOUT CONTRAST TECHNIQUE: Multidetector CT imaging of the head and cervical spine was performed following the standard protocol without intravenous contrast. Multiplanar CT image reconstructions of the cervical spine were also generated. RADIATION DOSE REDUCTION: This exam was performed according to the departmental dose-optimization program which includes automated exposure control, adjustment of the mA and/or kV according to patient size and/or use of iterative reconstruction technique. COMPARISON:  CT head and cervical spine 07/06/2020 FINDINGS: CT HEAD FINDINGS BRAIN: BRAIN Cerebral ventricle sizes are concordant with the degree of cerebral volume loss. Patchy and confluent areas of decreased attenuation are noted throughout the deep and periventricular white matter of the cerebral hemispheres bilaterally, compatible with chronic microvascular ischemic disease. No evidence of large-territorial acute infarction. No parenchymal hemorrhage. No mass lesion. No extra-axial collection. No mass effect or midline shift. No hydrocephalus. Basilar cisterns are patent. Vascular: No hyperdense vessel. Atherosclerotic calcifications are present within the cavernous internal carotid arteries. Skull: No acute fracture or focal lesion. Sinuses/Orbits: Paranasal sinuses and mastoid air cells are clear. Left lens replacement. Otherwise the orbits are unremarkable. Other: Carotid artery atherosclerotic plaque within the neck. CT CERVICAL SPINE FINDINGS Alignment: Normal. Skull base and vertebrae: Multilevel moderate degenerative changes of the spine with posterior disc osteophyte complex formation at  the C5-C6 level. No acute fracture. No aggressive appearing  focal osseous lesion or focal pathologic process. Soft tissues and spinal canal: No prevertebral fluid or swelling. No visible canal hematoma. Upper chest: Unremarkable. Other: Atherosclerotic plaque of the carotid arteries within the neck. IMPRESSION: No acute intracranial abnormality. No acute displaced fracture or traumatic listhesis of the cervical spine. Electronically Signed   By: Tish Frederickson M.D.   On: 05/09/2022 20:26   CT Cervical Spine Wo Contrast  Result Date: 05/09/2022 CLINICAL DATA:  Head trauma, moderate-severe; Neck trauma, intoxicated or obtunded (Age >= 16y) Pt suffered unwitnessed fall from wheelchair to ground found down by staff. EXAM: CT HEAD WITHOUT CONTRAST CT CERVICAL SPINE WITHOUT CONTRAST TECHNIQUE: Multidetector CT imaging of the head and cervical spine was performed following the standard protocol without intravenous contrast. Multiplanar CT image reconstructions of the cervical spine were also generated. RADIATION DOSE REDUCTION: This exam was performed according to the departmental dose-optimization program which includes automated exposure control, adjustment of the mA and/or kV according to patient size and/or use of iterative reconstruction technique. COMPARISON:  CT head and cervical spine 07/06/2020 FINDINGS: CT HEAD FINDINGS BRAIN: BRAIN Cerebral ventricle sizes are concordant with the degree of cerebral volume loss. Patchy and confluent areas of decreased attenuation are noted throughout the deep and periventricular white matter of the cerebral hemispheres bilaterally, compatible with chronic microvascular ischemic disease. No evidence of large-territorial acute infarction. No parenchymal hemorrhage. No mass lesion. No extra-axial collection. No mass effect or midline shift. No hydrocephalus. Basilar cisterns are patent. Vascular: No hyperdense vessel. Atherosclerotic calcifications are present within the cavernous internal carotid arteries. Skull: No acute fracture or  focal lesion. Sinuses/Orbits: Paranasal sinuses and mastoid air cells are clear. Left lens replacement. Otherwise the orbits are unremarkable. Other: Carotid artery atherosclerotic plaque within the neck. CT CERVICAL SPINE FINDINGS Alignment: Normal. Skull base and vertebrae: Multilevel moderate degenerative changes of the spine with posterior disc osteophyte complex formation at the C5-C6 level. No acute fracture. No aggressive appearing focal osseous lesion or focal pathologic process. Soft tissues and spinal canal: No prevertebral fluid or swelling. No visible canal hematoma. Upper chest: Unremarkable. Other: Atherosclerotic plaque of the carotid arteries within the neck. IMPRESSION: No acute intracranial abnormality. No acute displaced fracture or traumatic listhesis of the cervical spine. Electronically Signed   By: Tish Frederickson M.D.   On: 05/09/2022 20:26    Assessment/Plan 1. Frequent falls - fall 07/01, found on floor by nursing - ED evaluation - CT head/spine unremarkable - cont skilled nursing care  2. Neurocognitive disorder - no behaviors, poor safety awareness - less ambulatory and talkative within past few months - rigidity to BLE, right hand contracted, hand tremor - cont skilled nursing unit  3. Type 2 diabetes mellitus with other neurologic complication, without long-term current use of insulin (HCC) - A1c stable - cont metformin    Family/ staff Communication: plan discussed with patient and nurse  Labs/tests ordered:  none

## 2022-05-12 DIAGNOSIS — R1312 Dysphagia, oropharyngeal phase: Secondary | ICD-10-CM | POA: Diagnosis not present

## 2022-05-12 DIAGNOSIS — F0390 Unspecified dementia without behavioral disturbance: Secondary | ICD-10-CM | POA: Diagnosis not present

## 2022-05-13 DIAGNOSIS — R1312 Dysphagia, oropharyngeal phase: Secondary | ICD-10-CM | POA: Diagnosis not present

## 2022-05-13 DIAGNOSIS — F0390 Unspecified dementia without behavioral disturbance: Secondary | ICD-10-CM | POA: Diagnosis not present

## 2022-05-14 DIAGNOSIS — F0390 Unspecified dementia without behavioral disturbance: Secondary | ICD-10-CM | POA: Diagnosis not present

## 2022-05-14 DIAGNOSIS — R1312 Dysphagia, oropharyngeal phase: Secondary | ICD-10-CM | POA: Diagnosis not present

## 2022-05-15 DIAGNOSIS — R1312 Dysphagia, oropharyngeal phase: Secondary | ICD-10-CM | POA: Diagnosis not present

## 2022-05-15 DIAGNOSIS — F0390 Unspecified dementia without behavioral disturbance: Secondary | ICD-10-CM | POA: Diagnosis not present

## 2022-05-18 ENCOUNTER — Encounter: Payer: Self-pay | Admitting: Orthopedic Surgery

## 2022-05-18 ENCOUNTER — Non-Acute Institutional Stay (SKILLED_NURSING_FACILITY): Payer: Medicare Other | Admitting: Orthopedic Surgery

## 2022-05-18 DIAGNOSIS — R270 Ataxia, unspecified: Secondary | ICD-10-CM

## 2022-05-18 DIAGNOSIS — R419 Unspecified symptoms and signs involving cognitive functions and awareness: Secondary | ICD-10-CM

## 2022-05-18 DIAGNOSIS — E1149 Type 2 diabetes mellitus with other diabetic neurological complication: Secondary | ICD-10-CM

## 2022-05-18 DIAGNOSIS — R634 Abnormal weight loss: Secondary | ICD-10-CM | POA: Diagnosis not present

## 2022-05-18 DIAGNOSIS — R911 Solitary pulmonary nodule: Secondary | ICD-10-CM

## 2022-05-18 DIAGNOSIS — E1169 Type 2 diabetes mellitus with other specified complication: Secondary | ICD-10-CM

## 2022-05-18 DIAGNOSIS — Z8673 Personal history of transient ischemic attack (TIA), and cerebral infarction without residual deficits: Secondary | ICD-10-CM

## 2022-05-18 DIAGNOSIS — R1312 Dysphagia, oropharyngeal phase: Secondary | ICD-10-CM | POA: Diagnosis not present

## 2022-05-18 DIAGNOSIS — I10 Essential (primary) hypertension: Secondary | ICD-10-CM

## 2022-05-18 DIAGNOSIS — F0390 Unspecified dementia without behavioral disturbance: Secondary | ICD-10-CM | POA: Diagnosis not present

## 2022-05-18 DIAGNOSIS — R296 Repeated falls: Secondary | ICD-10-CM | POA: Diagnosis not present

## 2022-05-18 DIAGNOSIS — E785 Hyperlipidemia, unspecified: Secondary | ICD-10-CM

## 2022-05-18 DIAGNOSIS — M810 Age-related osteoporosis without current pathological fracture: Secondary | ICD-10-CM

## 2022-05-18 NOTE — Progress Notes (Signed)
Location:  [facility] Room Number: N04/A Place of Service:  SNF [phone]) Provider: Octavia Heir, NP   Patient Care Team: Mahlon Gammon, MD as PCP - General (Internal Medicine) [name], DO as Consulting Physician (Neurology)  Extended Emergency Contact Information Primary Emergency Contact: [name],[name] Address: [address]          [place] [address] [place] Home Phone: [phone] Relation: Sister Secondary Emergency Contact: [facility] Address: [address] [place], [place] [address] [name] Mobile Phone: [phone] Relation: Brother  Code Status:  DNR Goals of care: Advanced Directive information    05/18/2022   10:24 AM  Advanced Directives  Does Patient Have a Medical Advance Directive? Yes  Type of Estate agent of [place];Living will;Out of facility DNR (pink MOST or yellow form)  Does patient want to make changes to medical advance directive? No - Patient declined  Copy of Healthcare Power of Attorney in Chart? Yes - validated most recent copy scanned in chart (See row information)     Chief Complaint  Patient presents with   Medical Management of Chronic Issues    Routine visit.   Quality Metric Gaps    Discuss the need for Hep C screening and Shingrix vaccine, or post pone if patient refuses.     HPI:  [name] seen today for medical management of chronic diseases.    She currently resides on the skilled nursing unit at [facility]. Past medical history includes: HTN, T2DM, constipation, neurocognitive disorder, osteoporosis, dyslipidemia, hx of lung nodule, lung nodule, and abnormal gait.    Weight loss- see weights below, remains on milkshakes twice weekly T2DM-  7.1 (01/15/2022)> was 8.2 10/2021, no recent hypoglycemic events, blood sugars averaging 110-140's, remains on metformin, regular diet Neurocognitive disorder- BIMS 8 12/09/2021,  requiring more assistance with ADLs, nonverbal today, no behavioral outbursts Frequent falls- 07/01 unwitnessed fall, sent to ED due to change in LOC, 07/03 CT head unremarkable, no recent falls, see above HTN- BUN/creat 21/0.81 02/16/2022, off medications at this time Osteoporosis- past use of Fosamax, family does not want further bone density tests, remains on caltrate Lung nodule- 01/13/2021 2.5 mm nodule to right lung noted, 5 mm nodule to right shoulder also found by CT scan, watch/wait per ortho, CXR 04/14/2022 no lung nodules identified 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 No pertinent surgical history.  Allergies  Allergen Reactions   Augmentin [Amoxicillin-Pot Clavulanate] Hives   Evista [Raloxifene]    Meloxicam     Unknown reaction   Scallops [Shellfish Allergy] Other (See Comments)    "I don't feel so good after an hour"   Toviaz [Fesoterodine Fumarate Er]     Unknown reaction    Outpatient Encounter Medications as of 05/18/2022  Medication Sig   acetaminophen (TYLENOL) 325 MG tablet Take 650 mg by mouth 2 (two) times daily.   atorvastatin (LIPITOR) 40 MG tablet Take 1 tablet (40 mg total) by mouth daily.   Calcium Carbonate-Vitamin D (CALTRATE 600+D PO) Take 1 tablet by mouth 2 (two) times  daily.   cholecalciferol (VITAMIN D3) 25 MCG (1000 UNIT) tablet Take 1,000 Units by mouth daily.   clopidogrel (PLAVIX) 75 MG tablet Take 75 mg by mouth daily.   metFORMIN (GLUCOPHAGE) 1000 MG tablet Take 1,000 mg by mouth 2 (two) times daily with a meal.   sennosides-docusate sodium (SENOKOT-S) 8.6-50 MG tablet Take 1 tablet by mouth in the morning and at bedtime. Hold for loose stools   zinc oxide 20 % ointment  Apply 1 application topically as needed for irritation.   [DISCONTINUED] Multiple Vitamins-Minerals (CENTRUM SILVER PO) Take 1 tablet by mouth daily.   [DISCONTINUED] oxybutynin (DITROPAN-XL) 5 MG 24 hr tablet Take 5 mg by mouth at bedtime.   No facility-administered encounter medications on file as of 05/18/2022.    Review of Systems  Unable to perform ROS: Dementia    Immunization History  Administered Date(s) Administered   Influenza Inj Mdck Quad Pf 08/10/2019   Influenza-Unspecified 08/13/2020, 09/02/2021   Moderna Covid-19 Vaccine Bivalent Booster 46yrs & up 04/22/2022   Moderna SARS-COV2 Booster Vaccination 04/16/2021   Moderna Sars-Covid-2 Vaccination 12/11/2019, 01/08/2020   PNEUMOCOCCAL CONJUGATE-20 01/08/2022   Tdap 01/07/2022   Unspecified SARS-COV-2 Vaccination 09/23/2020, 07/30/2021   Zoster Recombinat (Shingrix) 07/01/2020   Zoster, Live 07/01/2020   Pertinent  Health Maintenance Due  Topic Date Due   INFLUENZA VACCINE  06/09/2022   HEMOGLOBIN A1C  07/18/2022   FOOT EXAM  08/20/2022   OPHTHALMOLOGY EXAM  12/04/2022   URINE MICROALBUMIN  01/11/2023   DEXA SCAN  Completed      07/07/2020    8:37 PM 07/08/2020    2:54 AM 07/08/2020   10:36 AM 07/10/2020   10:51 AM 06/25/2021    4:10 PM  Fall Risk  Falls in the past year?     1  Was there an injury with Fall?     0  Fall Risk Category Calculator     1  Fall Risk Category     Low  Patient Fall Risk Level High fall risk Low fall risk Moderate fall risk Moderate fall risk Moderate fall risk  Patient at Risk for Falls Due to     History of fall(s);Impaired balance/gait;Impaired mobility  Fall risk Follow up     Falls evaluation completed;Education provided;Falls prevention discussed   Functional Status Survey:    Vitals:   05/18/22 1017  BP: 112/61  Pulse: 88  Resp: 16  Temp: (!) 97.1 F (36.2 C)  SpO2: 96%  Weight: 157 lb (71.2 kg)  Height: 5\' 3"  (1.6 m)   Body mass index is 27.81 kg/m. Physical  Exam Vitals reviewed.  Constitutional:      General: She is not in acute distress. HENT:     Head: Normocephalic.     Right Ear: There is no impacted cerumen.     Left Ear: There is no impacted cerumen.     Nose: Nose normal.     Mouth/Throat:     Mouth: Mucous membranes are moist.  Eyes:     General:        Right eye: No discharge.        Left eye: No discharge.  Cardiovascular:     Rate and Rhythm: Normal rate and regular rhythm.     Pulses: Normal pulses.     Heart sounds: Normal heart sounds.  Pulmonary:     Effort: Pulmonary effort is normal. No respiratory distress.     Breath sounds: Normal breath sounds. No wheezing.  Abdominal:  General: Bowel sounds are normal. There is no distension.     Palpations: Abdomen is soft.     Tenderness: There is no abdominal tenderness.  Musculoskeletal:     Cervical back: Neck supple.     Right lower leg: No edema.     Left lower leg: No edema.  Skin:    General: Skin is warm and dry.     Capillary Refill: Capillary refill takes less than 2 seconds.  Neurological:     General: No focal deficit present.     Mental Status: She is alert. Mental status is at baseline.     Motor: Weakness present.     Gait: Gait abnormal.     Comments: Wheelchair, handgrips L>R  Psychiatric:        Mood and Affect: Mood normal.        Behavior: Behavior normal.     Comments: Nonverbal today, follows commands     Labs reviewed: Recent Labs    10/16/21 0000 02/09/22 0000 03/18/22 0000  NA 139 136* 138  K 3.9 3.9 4.2  CL 104 100 101  CO2 24* 27* 25*  BUN 16 16 21   CREATININE 0.8 0.8 0.8  CALCIUM 8.9 9.1 10.6   Recent Labs    10/16/21 0000 02/09/22 0000  AST 12* 20  ALT 11 18  ALKPHOS 37 49  ALBUMIN 3.6 4.0   Recent Labs    10/16/21 0000 02/09/22 0000 03/18/22 0000  WBC  --  6.5 6.3  NEUTROABS 2,576.00 5,434.00 3,944.00  HGB 11.9* 12.8 14.2  HCT 35* 38 42  PLT 224 204 343   Lab Results  Component Value Date   TSH 1.14  10/16/2021   Lab Results  Component Value Date   HGBA1C 7.1 01/15/2022   Lab Results  Component Value Date   CHOL 131 10/16/2021   HDL 37 10/16/2021   LDLCALC 67 10/16/2021   TRIG 196 (A) 10/16/2021   CHOLHDL 4.5 08/20/2019    Significant Diagnostic Results in last 30 days:  CT HEAD WO CONTRAST (5MM)  Result Date: 05/09/2022 CLINICAL DATA:  Head trauma, moderate-severe; Neck trauma, intoxicated or obtunded (Age >= 16y) Pt suffered unwitnessed fall from wheelchair to ground found down by staff. EXAM: CT HEAD WITHOUT CONTRAST CT CERVICAL SPINE WITHOUT CONTRAST TECHNIQUE: Multidetector CT imaging of the head and cervical spine was performed following the standard protocol without intravenous contrast. Multiplanar CT image reconstructions of the cervical spine were also generated. RADIATION DOSE REDUCTION: This exam was performed according to the departmental dose-optimization program which includes automated exposure control, adjustment of the mA and/or kV according to patient size and/or use of iterative reconstruction technique. COMPARISON:  CT head and cervical spine 07/06/2020 FINDINGS: CT HEAD FINDINGS BRAIN: BRAIN Cerebral ventricle sizes are concordant with the degree of cerebral volume loss. Patchy and confluent areas of decreased attenuation are noted throughout the deep and periventricular white matter of the cerebral hemispheres bilaterally, compatible with chronic microvascular ischemic disease. No evidence of large-territorial acute infarction. No parenchymal hemorrhage. No mass lesion. No extra-axial collection. No mass effect or midline shift. No hydrocephalus. Basilar cisterns are patent. Vascular: No hyperdense vessel. Atherosclerotic calcifications are present within the cavernous internal carotid arteries. Skull: No acute fracture or focal lesion. Sinuses/Orbits: Paranasal sinuses and mastoid air cells are clear. Left lens replacement. Otherwise the orbits are unremarkable. Other:  Carotid artery atherosclerotic plaque within the neck. CT CERVICAL SPINE FINDINGS Alignment: Normal. Skull base and vertebrae: Multilevel moderate degenerative changes  of the spine with posterior disc osteophyte complex formation at the C5-C6 level. No acute fracture. No aggressive appearing focal osseous lesion or focal pathologic process. Soft tissues and spinal canal: No prevertebral fluid or swelling. No visible canal hematoma. Upper chest: Unremarkable. Other: Atherosclerotic plaque of the carotid arteries within the neck. IMPRESSION: No acute intracranial abnormality. No acute displaced fracture or traumatic listhesis of the cervical spine. Electronically Signed   By: Tish Frederickson M.D.   On: 05/09/2022 20:26   CT Cervical Spine Wo Contrast  Result Date: 05/09/2022 CLINICAL DATA:  Head trauma, moderate-severe; Neck trauma, intoxicated or obtunded (Age >= 16y) Pt suffered unwitnessed fall from wheelchair to ground found down by staff. EXAM: CT HEAD WITHOUT CONTRAST CT CERVICAL SPINE WITHOUT CONTRAST TECHNIQUE: Multidetector CT imaging of the head and cervical spine was performed following the standard protocol without intravenous contrast. Multiplanar CT image reconstructions of the cervical spine were also generated. RADIATION DOSE REDUCTION: This exam was performed according to the departmental dose-optimization program which includes automated exposure control, adjustment of the mA and/or kV according to patient size and/or use of iterative reconstruction technique. COMPARISON:  CT head and cervical spine 07/06/2020 FINDINGS: CT HEAD FINDINGS BRAIN: BRAIN Cerebral ventricle sizes are concordant with the degree of cerebral volume loss. Patchy and confluent areas of decreased attenuation are noted throughout the deep and periventricular white matter of the cerebral hemispheres bilaterally, compatible with chronic microvascular ischemic disease. No evidence of large-territorial acute infarction. No  parenchymal hemorrhage. No mass lesion. No extra-axial collection. No mass effect or midline shift. No hydrocephalus. Basilar cisterns are patent. Vascular: No hyperdense vessel. Atherosclerotic calcifications are present within the cavernous internal carotid arteries. Skull: No acute fracture or focal lesion. Sinuses/Orbits: Paranasal sinuses and mastoid air cells are clear. Left lens replacement. Otherwise the orbits are unremarkable. Other: Carotid artery atherosclerotic plaque within the neck. CT CERVICAL SPINE FINDINGS Alignment: Normal. Skull base and vertebrae: Multilevel moderate degenerative changes of the spine with posterior disc osteophyte complex formation at the C5-C6 level. No acute fracture. No aggressive appearing focal osseous lesion or focal pathologic process. Soft tissues and spinal canal: No prevertebral fluid or swelling. No visible canal hematoma. Upper chest: Unremarkable. Other: Atherosclerotic plaque of the carotid arteries within the neck. IMPRESSION: No acute intracranial abnormality. No acute displaced fracture or traumatic listhesis of the cervical spine. Electronically Signed   By: Tish Frederickson M.D.   On: 05/09/2022 20:26    Assessment/Plan 1. Weight loss - weights stable - cont milkshakes twice weekly - cont monthly weights  2. Type 2 diabetes mellitus with other neurologic complication, without long-term current use of insulin (HCC) - A1c stable - no hypoglycemia - cont metformin  3. Neurocognitive disorder - nonverbal today, followed commands - needs assistance with all ADLs except feeding - cont skilled nursing  4. Frequent falls - 07/03 mechanical fall- ED evaluation - cont falls safety measures  5. Primary hypertension - controlled without medication  6. Age related osteoporosis, unspecified pathological fracture presence - no further workup per family - past use of fosamax - cont Caltrate  7. Lung nodule - past CXR indicated 2 benign nodules -  CXR 06/06 no acute cardiopulmonary findings/ left lung infiltrate resolved/nodules not identified  8. Ataxia - high risk for falls - cont skilled nursing care  9. Hyperlipidemia associated with type 2 diabetes mellitus (HCC) - cont statin  10. History of TIA (transient ischemic attack) - cont Plavix and statin   Family/ staff Communication: plan  discussed with patient and nurse  Labs/tests ordered:  none

## 2022-05-19 DIAGNOSIS — F0390 Unspecified dementia without behavioral disturbance: Secondary | ICD-10-CM | POA: Diagnosis not present

## 2022-05-19 DIAGNOSIS — R1312 Dysphagia, oropharyngeal phase: Secondary | ICD-10-CM | POA: Diagnosis not present

## 2022-05-20 DIAGNOSIS — R1312 Dysphagia, oropharyngeal phase: Secondary | ICD-10-CM | POA: Diagnosis not present

## 2022-05-20 DIAGNOSIS — F0390 Unspecified dementia without behavioral disturbance: Secondary | ICD-10-CM | POA: Diagnosis not present

## 2022-05-21 DIAGNOSIS — F0390 Unspecified dementia without behavioral disturbance: Secondary | ICD-10-CM | POA: Diagnosis not present

## 2022-05-21 DIAGNOSIS — R1312 Dysphagia, oropharyngeal phase: Secondary | ICD-10-CM | POA: Diagnosis not present

## 2022-05-22 DIAGNOSIS — R1312 Dysphagia, oropharyngeal phase: Secondary | ICD-10-CM | POA: Diagnosis not present

## 2022-05-22 DIAGNOSIS — F0390 Unspecified dementia without behavioral disturbance: Secondary | ICD-10-CM | POA: Diagnosis not present

## 2022-05-25 DIAGNOSIS — R1312 Dysphagia, oropharyngeal phase: Secondary | ICD-10-CM | POA: Diagnosis not present

## 2022-05-25 DIAGNOSIS — F0390 Unspecified dementia without behavioral disturbance: Secondary | ICD-10-CM | POA: Diagnosis not present

## 2022-05-26 DIAGNOSIS — R1312 Dysphagia, oropharyngeal phase: Secondary | ICD-10-CM | POA: Diagnosis not present

## 2022-05-26 DIAGNOSIS — F0390 Unspecified dementia without behavioral disturbance: Secondary | ICD-10-CM | POA: Diagnosis not present

## 2022-05-27 DIAGNOSIS — F0390 Unspecified dementia without behavioral disturbance: Secondary | ICD-10-CM | POA: Diagnosis not present

## 2022-05-27 DIAGNOSIS — R1312 Dysphagia, oropharyngeal phase: Secondary | ICD-10-CM | POA: Diagnosis not present

## 2022-05-28 DIAGNOSIS — R1312 Dysphagia, oropharyngeal phase: Secondary | ICD-10-CM | POA: Diagnosis not present

## 2022-05-28 DIAGNOSIS — F0390 Unspecified dementia without behavioral disturbance: Secondary | ICD-10-CM | POA: Diagnosis not present

## 2022-05-29 DIAGNOSIS — R1312 Dysphagia, oropharyngeal phase: Secondary | ICD-10-CM | POA: Diagnosis not present

## 2022-05-29 DIAGNOSIS — F0390 Unspecified dementia without behavioral disturbance: Secondary | ICD-10-CM | POA: Diagnosis not present

## 2022-06-01 DIAGNOSIS — R1312 Dysphagia, oropharyngeal phase: Secondary | ICD-10-CM | POA: Diagnosis not present

## 2022-06-01 DIAGNOSIS — F0390 Unspecified dementia without behavioral disturbance: Secondary | ICD-10-CM | POA: Diagnosis not present

## 2022-06-02 DIAGNOSIS — F0390 Unspecified dementia without behavioral disturbance: Secondary | ICD-10-CM | POA: Diagnosis not present

## 2022-06-02 DIAGNOSIS — R1312 Dysphagia, oropharyngeal phase: Secondary | ICD-10-CM | POA: Diagnosis not present

## 2022-06-03 DIAGNOSIS — F0390 Unspecified dementia without behavioral disturbance: Secondary | ICD-10-CM | POA: Diagnosis not present

## 2022-06-03 DIAGNOSIS — R1312 Dysphagia, oropharyngeal phase: Secondary | ICD-10-CM | POA: Diagnosis not present

## 2022-06-04 DIAGNOSIS — R1312 Dysphagia, oropharyngeal phase: Secondary | ICD-10-CM | POA: Diagnosis not present

## 2022-06-04 DIAGNOSIS — F0390 Unspecified dementia without behavioral disturbance: Secondary | ICD-10-CM | POA: Diagnosis not present

## 2022-06-05 DIAGNOSIS — E119 Type 2 diabetes mellitus without complications: Secondary | ICD-10-CM | POA: Diagnosis not present

## 2022-06-05 DIAGNOSIS — M79672 Pain in left foot: Secondary | ICD-10-CM | POA: Diagnosis not present

## 2022-06-05 DIAGNOSIS — R1312 Dysphagia, oropharyngeal phase: Secondary | ICD-10-CM | POA: Diagnosis not present

## 2022-06-05 DIAGNOSIS — M79671 Pain in right foot: Secondary | ICD-10-CM | POA: Diagnosis not present

## 2022-06-05 DIAGNOSIS — F0390 Unspecified dementia without behavioral disturbance: Secondary | ICD-10-CM | POA: Diagnosis not present

## 2022-06-05 DIAGNOSIS — L602 Onychogryphosis: Secondary | ICD-10-CM | POA: Diagnosis not present

## 2022-06-08 DIAGNOSIS — R1312 Dysphagia, oropharyngeal phase: Secondary | ICD-10-CM | POA: Diagnosis not present

## 2022-06-08 DIAGNOSIS — F0390 Unspecified dementia without behavioral disturbance: Secondary | ICD-10-CM | POA: Diagnosis not present

## 2022-06-09 DIAGNOSIS — F0390 Unspecified dementia without behavioral disturbance: Secondary | ICD-10-CM | POA: Diagnosis not present

## 2022-06-09 DIAGNOSIS — R1312 Dysphagia, oropharyngeal phase: Secondary | ICD-10-CM | POA: Diagnosis not present

## 2022-06-10 DIAGNOSIS — R1312 Dysphagia, oropharyngeal phase: Secondary | ICD-10-CM | POA: Diagnosis not present

## 2022-06-10 DIAGNOSIS — F0390 Unspecified dementia without behavioral disturbance: Secondary | ICD-10-CM | POA: Diagnosis not present

## 2022-06-11 ENCOUNTER — Non-Acute Institutional Stay (SKILLED_NURSING_FACILITY): Payer: Medicare Other | Admitting: Internal Medicine

## 2022-06-11 ENCOUNTER — Encounter: Payer: Self-pay | Admitting: Internal Medicine

## 2022-06-11 DIAGNOSIS — F0390 Unspecified dementia without behavioral disturbance: Secondary | ICD-10-CM | POA: Diagnosis not present

## 2022-06-11 DIAGNOSIS — R1312 Dysphagia, oropharyngeal phase: Secondary | ICD-10-CM

## 2022-06-11 DIAGNOSIS — E1149 Type 2 diabetes mellitus with other diabetic neurological complication: Secondary | ICD-10-CM | POA: Diagnosis not present

## 2022-06-11 DIAGNOSIS — I10 Essential (primary) hypertension: Secondary | ICD-10-CM | POA: Diagnosis not present

## 2022-06-11 DIAGNOSIS — R270 Ataxia, unspecified: Secondary | ICD-10-CM

## 2022-06-11 DIAGNOSIS — R296 Repeated falls: Secondary | ICD-10-CM

## 2022-06-11 DIAGNOSIS — M810 Age-related osteoporosis without current pathological fracture: Secondary | ICD-10-CM

## 2022-06-11 DIAGNOSIS — R419 Unspecified symptoms and signs involving cognitive functions and awareness: Secondary | ICD-10-CM | POA: Diagnosis not present

## 2022-06-11 DIAGNOSIS — E1169 Type 2 diabetes mellitus with other specified complication: Secondary | ICD-10-CM

## 2022-06-11 DIAGNOSIS — E785 Hyperlipidemia, unspecified: Secondary | ICD-10-CM

## 2022-06-11 DIAGNOSIS — Z8673 Personal history of transient ischemic attack (TIA), and cerebral infarction without residual deficits: Secondary | ICD-10-CM

## 2022-06-11 NOTE — Progress Notes (Signed)
Location:   [facility] Room Number: 4-A Place of Service:  SNF [phone]) Provider: Merian Capron     Patient Care Team: Mahlon Gammon, MD as PCP - General (Internal Medicine) [name], DO as Consulting Physician (Neurology)  Extended Emergency Contact Information Primary Emergency Contact: [name],[name] Address: [address]          [place] [address] [place] Home Phone: [phone] Relation: Sister Secondary Emergency Contact: [facility] Address: [address] [place], [place] [address] [name] Mobile Phone: [phone] Relation: Brother  Code Status:  DNR Goals of care: Advanced Directive information    06/11/2022   11:45 AM  Advanced Directives  Does Patient Have a Medical Advance Directive? Yes  Type of Estate agent of [place];Living will;Out of facility DNR (pink MOST or yellow form)  Does patient want to make changes to medical advance directive? No - Patient declined  Copy of Healthcare Power of Attorney in Chart? Yes - validated most recent copy scanned in chart (See row information)     Chief Complaint  Patient presents with   Medical Management of Chronic Issues    Routine Visit.   Health Maintenance    Discuss the need for Hepatitis C Screening.   Immunizations    Discuss the need for 2nd Shingrix vaccine, and Influenza vaccine.    HPI:  [name] seen today for medical management of chronic diseases.    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 Speech to evaluate Per Nurses she is not able to sometimes swallow her meds Past Medical History:  Diagnosis  Date   Diabetes mellitus    Dyslipidemia    Hypertension    Osteoporosis    Urinary incontinence    History reviewed. No pertinent surgical history.  Allergies  Allergen Reactions   Augmentin [Amoxicillin-Pot Clavulanate] Hives   Evista [Raloxifene]    Meloxicam     Unknown reaction   Scallops [Shellfish Allergy] Other (See Comments)    "I don't feel so good after an hour"   Toviaz [Fesoterodine Fumarate Er]     Unknown reaction    Allergies as of 06/11/2022       Reactions   Augmentin [amoxicillin-pot Clavulanate] Hives   Evista [raloxifene]    Meloxicam    Unknown reaction   Scallops [shellfish Allergy] Other (See Comments)   "I don't feel so good after an hour"   Toviaz [fesoterodine Fumarate Er]    Unknown reaction        Medication List        Accurate as of June 11, 2022 11:45 AM. If you have any questions, ask your nurse or doctor.          acetaminophen 325 MG tablet Commonly known as: TYLENOL Take 650 mg by mouth 2 (two) times daily.   atorvastatin 40 MG tablet Commonly known as: LIPITOR Take 1 tablet (40 mg total) by mouth daily.   CALTRATE 600+D PO Take 1 tablet by mouth 2 (two) times daily.   cholecalciferol 25 MCG (1000 UNIT) tablet Commonly known as: VITAMIN D3 Take 1,000 Units  by mouth daily.   clopidogrel 75 MG tablet Commonly known as: PLAVIX Take 75 mg by mouth daily.   metFORMIN 1000 MG tablet Commonly known as: GLUCOPHAGE Take 1,000 mg by mouth 2 (two) times daily with a meal.   sennosides-docusate sodium 8.6-50 MG tablet Commonly known as: SENOKOT-S Take 1 tablet by mouth in the morning and at bedtime. Hold for loose stools   zinc oxide 20 % ointment Apply 1 application topically as needed for irritation.        Review of Systems  Unable to perform ROS: Patient nonverbal    Immunization History  Administered Date(s) Administered   Influenza Inj Mdck Quad Pf 08/10/2019   Influenza-Unspecified 08/13/2020,  09/02/2021   Moderna Covid-19 Vaccine Bivalent Booster 73yrs & up 04/22/2022   Moderna SARS-COV2 Booster Vaccination 04/16/2021   Moderna Sars-Covid-2 Vaccination 12/11/2019, 01/08/2020   PNEUMOCOCCAL CONJUGATE-20 01/08/2022   Tdap 01/07/2022   Unspecified SARS-COV-2 Vaccination 09/23/2020, 07/30/2021   Zoster Recombinat (Shingrix) 07/01/2020   Zoster, Live 07/01/2020   Pertinent  Health Maintenance Due  Topic Date Due   INFLUENZA VACCINE  06/09/2022   HEMOGLOBIN A1C  07/18/2022   FOOT EXAM  08/20/2022   OPHTHALMOLOGY EXAM  12/04/2022   URINE MICROALBUMIN  01/11/2023   DEXA SCAN  Completed      07/07/2020    8:37 PM 07/08/2020    2:54 AM 07/08/2020   10:36 AM 07/10/2020   10:51 AM 06/25/2021    4:10 PM  Fall Risk  Falls in the past year?     1  Was there an injury with Fall?     0  Fall Risk Category Calculator     1  Fall Risk Category     Low  Patient Fall Risk Level High fall risk Low fall risk Moderate fall risk Moderate fall risk Moderate fall risk  Patient at Risk for Falls Due to     History of fall(s);Impaired balance/gait;Impaired mobility  Fall risk Follow up     Falls evaluation completed;Education provided;Falls prevention discussed   Functional Status Survey:    Vitals:   06/11/22 1119  BP: 112/71  Pulse: 88  Resp: 16  Temp: 97.8 F (36.6 C)  SpO2: 99%  Weight: 134 lb 4.8 oz (60.9 kg)  Height: 5\' 3"  (1.6 m)   Body mass index is 23.79 kg/m. Physical Exam Vitals reviewed.  Constitutional:      Appearance: Normal appearance.  HENT:     Head: Normocephalic.     Nose: Nose normal.     Mouth/Throat:     Mouth: Mucous membranes are moist.     Pharynx: Oropharynx is clear.  Eyes:     Pupils: Pupils are equal, round, and reactive to light.  Cardiovascular:     Rate and Rhythm: Normal rate and regular rhythm.     Pulses: Normal pulses.     Heart sounds: Normal heart sounds. No murmur heard. Pulmonary:     Effort: Pulmonary effort is normal.     Breath  sounds: Normal breath sounds.  Abdominal:     General: Abdomen is flat. Bowel sounds are normal.     Palpations: Abdomen is soft.  Musculoskeletal:        General: No swelling.     Cervical back: Neck supple.  Skin:    General: Skin is warm.  Neurological:     General: No focal deficit present.     Mental Status: She is alert.     Comments: Now  has aphasia  Psychiatric:        Mood and Affect: Mood normal.        Thought Content: Thought content normal.     Labs reviewed: Recent Labs    10/16/21 0000 02/09/22 0000 03/18/22 0000  NA 139 136* 138  K 3.9 3.9 4.2  CL 104 100 101  CO2 24* 27* 25*  BUN 16 16 21   CREATININE 0.8 0.8 0.8  CALCIUM 8.9 9.1 10.6   Recent Labs    10/16/21 0000 02/09/22 0000  AST 12* 20  ALT 11 18  ALKPHOS 37 49  ALBUMIN 3.6 4.0   Recent Labs    10/16/21 0000 02/09/22 0000 03/18/22 0000  WBC  --  6.5 6.3  NEUTROABS 2,576.00 5,434.00 3,944.00  HGB 11.9* 12.8 14.2  HCT 35* 38 42  PLT 224 204 343   Lab Results  Component Value Date   TSH 1.14 10/16/2021   Lab Results  Component Value Date   HGBA1C 7.1 01/15/2022   Lab Results  Component Value Date   CHOL 131 10/16/2021   HDL 37 10/16/2021   LDLCALC 67 10/16/2021   TRIG 196 (A) 10/16/2021   CHOLHDL 4.5 08/20/2019    Significant Diagnostic Results in last 30 days:  No results found.  Assessment/Plan 1. Type 2 diabetes mellitus with other neurologic complication, without long-term current use of insulin (HCC) On Metformin A1C exceptable CBGS in 130s  2. Neurocognitive disorder Now mostly wheelchair dependent  3. Frequent falls Continue to be high risk 4 Oropharyngeal dysphagia Has lost some weight  Speech to evaluate  5. Ataxia Cannot walk  6. Hyperlipidemia associated with type 2 diabetes mellitus (HCC) Continue Statin  7. History of TIA (transient ischemic attack) On Plavix and statin  8. Age related osteoporosis, unspecified pathological fracture  presence Was on Fosamax Family did not want Follow up DEXA      Family/ staff Communication:   Labs/tests ordered:  Lipid and A1C

## 2022-06-12 DIAGNOSIS — R1312 Dysphagia, oropharyngeal phase: Secondary | ICD-10-CM | POA: Diagnosis not present

## 2022-06-12 DIAGNOSIS — F0390 Unspecified dementia without behavioral disturbance: Secondary | ICD-10-CM | POA: Diagnosis not present

## 2022-06-15 DIAGNOSIS — F0390 Unspecified dementia without behavioral disturbance: Secondary | ICD-10-CM | POA: Diagnosis not present

## 2022-06-15 DIAGNOSIS — R1312 Dysphagia, oropharyngeal phase: Secondary | ICD-10-CM | POA: Diagnosis not present

## 2022-06-16 DIAGNOSIS — R1312 Dysphagia, oropharyngeal phase: Secondary | ICD-10-CM | POA: Diagnosis not present

## 2022-06-16 DIAGNOSIS — F0390 Unspecified dementia without behavioral disturbance: Secondary | ICD-10-CM | POA: Diagnosis not present

## 2022-06-17 ENCOUNTER — Encounter: Payer: Self-pay | Admitting: Orthopedic Surgery

## 2022-06-17 ENCOUNTER — Non-Acute Institutional Stay (SKILLED_NURSING_FACILITY): Payer: Medicare Other | Admitting: Orthopedic Surgery

## 2022-06-17 DIAGNOSIS — R0989 Other specified symptoms and signs involving the circulatory and respiratory systems: Secondary | ICD-10-CM

## 2022-06-17 DIAGNOSIS — R634 Abnormal weight loss: Secondary | ICD-10-CM

## 2022-06-17 DIAGNOSIS — R296 Repeated falls: Secondary | ICD-10-CM | POA: Diagnosis not present

## 2022-06-17 DIAGNOSIS — F0390 Unspecified dementia without behavioral disturbance: Secondary | ICD-10-CM | POA: Diagnosis not present

## 2022-06-17 DIAGNOSIS — R1312 Dysphagia, oropharyngeal phase: Secondary | ICD-10-CM

## 2022-06-17 DIAGNOSIS — R419 Unspecified symptoms and signs involving cognitive functions and awareness: Secondary | ICD-10-CM | POA: Diagnosis not present

## 2022-06-17 NOTE — Progress Notes (Addendum)
Location:   [facility] Room Number: 4 Place of Service:  SNF [phone]) Provider:  Hazle Nordmann, NP  Mahlon Gammon, MD  Patient Care Team: Mahlon Gammon, MD as PCP - General (Internal Medicine) [name], DO as Consulting Physician (Neurology)  Extended Emergency Contact Information Primary Emergency Contact: [name],[name] Address: [address]          [place] [address] [place] Home Phone: [phone] Relation: Sister Secondary Emergency Contact: [facility] Address: [address] [place], [place] [address] [name] Mobile Phone: [phone] Relation: Brother  Code Status:  DNR Goals of care: Advanced Directive information    06/17/2022    3:05 PM  Advanced Directives  Does Patient Have a Medical Advance Directive? Yes  Type of Estate agent of [place];Living will;Out of facility DNR (pink MOST or yellow form)  Does patient want to make changes to medical advance directive? No - Patient declined  Copy of Healthcare Power of Attorney in Chart? Yes - validated most recent copy scanned in chart (See row information)  Pre-existing out of facility DNR order (yellow form or pink MOST form) Pink MOST form placed in chart (order not valid for inpatient use)     Chief Complaint  Patient presents with   Acute Visit    Choking    HPI:  [name] seen today for an acute visit for chocking episode.   She currently resides on the skilled nursing unit at [facility]. PMH: HTN, T2DM, Fragile X syndrome associated with ataxia, neurocognitive deficit, osteoporosis, dyslipidemia, hx of lung nodule, and abnormal gait.    08/02 choking episode during mealtime. ST consulted, recommended bite sized foods with thin liquids, monitor use with straws. She was noted to take huge gulps when drinking fluids. Today, she had another choking episode during lunchtime. In addition, she has had  trouble swallowing whole medications. Nursing staff has been crushing meds. She was able to take a few sips of water without difficulty during our encounter. Nonverbal today, noted to have aphasia. Fall 08/08, observed on floor holding on to wheelchair. No apparent injury.   Neurocognitive disorder- BIMS score 3 (07/26)> was 8 (01/31), requiring more assistance with ADLs, nonverbal today, no behavioral outbursts Weight loss- remains on milkshakes twice weekly  Past Medical History:  Diagnosis Date   Diabetes mellitus    Dyslipidemia    Hypertension    Osteoporosis    Urinary incontinence    History reviewed. No pertinent surgical history.  Allergies  Allergen Reactions   Augmentin [Amoxicillin-Pot Clavulanate] Hives   Evista [Raloxifene]    Meloxicam     Unknown reaction   Scallops [Shellfish Allergy] Other (See Comments)    "I don't feel so good after an hour"   Toviaz [Fesoterodine Fumarate Er]     Unknown reaction    Allergies as of 06/17/2022       Reactions   Augmentin [amoxicillin-pot Clavulanate] Hives   Evista [raloxifene]    Meloxicam    Unknown reaction   Scallops [shellfish Allergy] Other (See Comments)   "I don't feel so good after an hour"   Toviaz [fesoterodine Fumarate Er]    Unknown reaction        Medication List        Accurate as of June 17, 2022  3:06 PM. If you have any questions, ask your nurse  or doctor.          acetaminophen 325 MG tablet Commonly known as: TYLENOL Take 650 mg by mouth 2 (two) times daily.   atorvastatin 40 MG tablet Commonly known as: LIPITOR Take 1 tablet (40 mg total) by mouth daily.   CALTRATE 600+D PO Take 1 tablet by mouth 2 (two) times daily.   cholecalciferol 25 MCG (1000 UNIT) tablet Commonly known as: VITAMIN D3 Take 1,000 Units by mouth daily.   clopidogrel 75 MG tablet Commonly known as: PLAVIX Take 75 mg by mouth daily.   metFORMIN 1000 MG tablet Commonly known as: GLUCOPHAGE Take 1,000 mg  by mouth 2 (two) times daily with a meal.   sennosides-docusate sodium 8.6-50 MG tablet Commonly known as: SENOKOT-S Take 1 tablet by mouth in the morning and at bedtime. Hold for loose stools   zinc oxide 20 % ointment Apply 1 application topically as needed for irritation.        Review of Systems  Unable to perform ROS: Patient nonverbal    Immunization History  Administered Date(s) Administered   Influenza Inj Mdck Quad Pf 08/10/2019   Influenza-Unspecified 08/13/2020, 09/02/2021   Moderna Covid-19 Vaccine Bivalent Booster 4yrs & up 04/22/2022   Moderna SARS-COV2 Booster Vaccination 04/16/2021   Moderna Sars-Covid-2 Vaccination 12/11/2019, 01/08/2020   PNEUMOCOCCAL CONJUGATE-20 01/08/2022   Tdap 01/07/2022   Unspecified SARS-COV-2 Vaccination 09/23/2020, 07/30/2021   Zoster Recombinat (Shingrix) 07/01/2020   Zoster, Live 07/01/2020   Pertinent  Health Maintenance Due  Topic Date Due   INFLUENZA VACCINE  06/09/2022   HEMOGLOBIN A1C  07/18/2022   FOOT EXAM  08/20/2022   OPHTHALMOLOGY EXAM  12/04/2022   URINE MICROALBUMIN  01/11/2023   DEXA SCAN  Completed      07/07/2020    8:37 PM 07/08/2020    2:54 AM 07/08/2020   10:36 AM 07/10/2020   10:51 AM 06/25/2021    4:10 PM  Fall Risk  Falls in the past year?     1  Was there an injury with Fall?     0  Fall Risk Category Calculator     1  Fall Risk Category     Low  Patient Fall Risk Level High fall risk Low fall risk Moderate fall risk Moderate fall risk Moderate fall risk  Patient at Risk for Falls Due to     History of fall(s);Impaired balance/gait;Impaired mobility  Fall risk Follow up     Falls evaluation completed;Education provided;Falls prevention discussed   Functional Status Survey:    Vitals:   06/17/22 1501  BP: 109/63  Pulse: 91  Resp: 18  Temp: (!) 95.6 F (35.3 C)  SpO2: 97%  Weight: 135 lb 3.2 oz (61.3 kg)  Height: 5\' 3"  (1.6 m)   Body mass index is 23.95 kg/m. Physical Exam Vitals  reviewed.  Constitutional:      General: She is not in acute distress. HENT:     Head: Normocephalic.  Eyes:     General:        Right eye: No discharge.        Left eye: No discharge.  Cardiovascular:     Rate and Rhythm: Normal rate and regular rhythm.     Pulses: Normal pulses.     Heart sounds: Normal heart sounds.  Pulmonary:     Effort: Pulmonary effort is normal. No respiratory distress.     Breath sounds: Normal breath sounds. No wheezing.  Abdominal:     General: Bowel  sounds are normal. There is no distension.     Palpations: Abdomen is soft.     Tenderness: There is no abdominal tenderness.  Musculoskeletal:     Cervical back: Neck supple.     Right lower leg: No edema.     Left lower leg: No edema.  Skin:    General: Skin is warm and dry.     Capillary Refill: Capillary refill takes less than 2 seconds.  Neurological:     General: No focal deficit present.     Mental Status: She is alert. Mental status is at baseline.     Motor: Weakness present.     Gait: Gait abnormal.     Comments: Wheelchair  Psychiatric:        Mood and Affect: Mood normal.        Behavior: Behavior normal.     Comments: Non verbal, follows some commands, alert to self/person     Labs reviewed: Recent Labs    10/16/21 0000 02/09/22 0000 03/18/22 0000  NA 139 136* 138  K 3.9 3.9 4.2  CL 104 100 101  CO2 24* 27* 25*  BUN 16 16 21   CREATININE 0.8 0.8 0.8  CALCIUM 8.9 9.1 10.6   Recent Labs    10/16/21 0000 02/09/22 0000  AST 12* 20  ALT 11 18  ALKPHOS 37 49  ALBUMIN 3.6 4.0   Recent Labs    10/16/21 0000 02/09/22 0000 03/18/22 0000  WBC  --  6.5 6.3  NEUTROABS 2,576.00 5,434.00 3,944.00  HGB 11.9* 12.8 14.2  HCT 35* 38 42  PLT 224 204 343   Lab Results  Component Value Date   TSH 1.14 10/16/2021   Lab Results  Component Value Date   HGBA1C 7.1 01/15/2022   Lab Results  Component Value Date   CHOL 131 10/16/2021   HDL 37 10/16/2021   LDLCALC 67  10/16/2021   TRIG 196 (A) 10/16/2021   CHOLHDL 4.5 08/20/2019    Significant Diagnostic Results in last 30 days:  No results found.  Assessment/Plan 1. Oropharyngeal dysphagia - 2 choking episodes in 2 weeks - followed by ST - noted to gulp fluids with straw - trouble swallowing meds whole - cont crushed medications - discontinue regular diet and thin liquids - start pureed foods with nectar thick liquids  2. Choking episode occurring during daytime - see above  3. Neurocognitive disorder - progressing - BIMS score now 3, was 8 (11/2021) - dependent with ADLs - aphasia - cont skilled nursing care  4. Frequent falls - 08/08 observed on floor - no apparent injury - cont falls safety precautions  5. Weight loss - cont milkshakes twice weekly - cont monthly weights    Family/ staff Communication: plan discussed with patient and nurse  Labs/tests ordered: none

## 2022-06-18 DIAGNOSIS — R1312 Dysphagia, oropharyngeal phase: Secondary | ICD-10-CM | POA: Diagnosis not present

## 2022-06-18 DIAGNOSIS — F0390 Unspecified dementia without behavioral disturbance: Secondary | ICD-10-CM | POA: Diagnosis not present

## 2022-06-19 DIAGNOSIS — R1312 Dysphagia, oropharyngeal phase: Secondary | ICD-10-CM | POA: Diagnosis not present

## 2022-06-19 DIAGNOSIS — F0390 Unspecified dementia without behavioral disturbance: Secondary | ICD-10-CM | POA: Diagnosis not present

## 2022-06-22 DIAGNOSIS — E785 Hyperlipidemia, unspecified: Secondary | ICD-10-CM | POA: Diagnosis not present

## 2022-06-22 DIAGNOSIS — E119 Type 2 diabetes mellitus without complications: Secondary | ICD-10-CM | POA: Diagnosis not present

## 2022-06-22 DIAGNOSIS — R1312 Dysphagia, oropharyngeal phase: Secondary | ICD-10-CM | POA: Diagnosis not present

## 2022-06-22 DIAGNOSIS — F0390 Unspecified dementia without behavioral disturbance: Secondary | ICD-10-CM | POA: Diagnosis not present

## 2022-06-22 LAB — HEMOGLOBIN A1C: Hemoglobin A1C: 6.1

## 2022-06-22 LAB — LIPID PANEL
Cholesterol: 147 (ref 0–200)
HDL: 36 (ref 35–70)
LDL Cholesterol: 86
LDl/HDL Ratio: 4.1
Triglycerides: 150 (ref 40–160)

## 2022-06-23 DIAGNOSIS — R1312 Dysphagia, oropharyngeal phase: Secondary | ICD-10-CM | POA: Diagnosis not present

## 2022-06-23 DIAGNOSIS — F0390 Unspecified dementia without behavioral disturbance: Secondary | ICD-10-CM | POA: Diagnosis not present

## 2022-06-23 LAB — LIPID PANEL
Cholesterol: 147 (ref 0–200)
HDL: 36 (ref 35–70)
LDL Cholesterol: 86
Triglycerides: 150 (ref 40–160)

## 2022-06-23 LAB — HEMOGLOBIN A1C: Hemoglobin A1C: 6.1

## 2022-06-24 DIAGNOSIS — F0390 Unspecified dementia without behavioral disturbance: Secondary | ICD-10-CM | POA: Diagnosis not present

## 2022-06-24 DIAGNOSIS — R1312 Dysphagia, oropharyngeal phase: Secondary | ICD-10-CM | POA: Diagnosis not present

## 2022-06-25 DIAGNOSIS — F0390 Unspecified dementia without behavioral disturbance: Secondary | ICD-10-CM | POA: Diagnosis not present

## 2022-06-25 DIAGNOSIS — R1312 Dysphagia, oropharyngeal phase: Secondary | ICD-10-CM | POA: Diagnosis not present

## 2022-06-26 DIAGNOSIS — R1312 Dysphagia, oropharyngeal phase: Secondary | ICD-10-CM | POA: Diagnosis not present

## 2022-06-26 DIAGNOSIS — F0390 Unspecified dementia without behavioral disturbance: Secondary | ICD-10-CM | POA: Diagnosis not present

## 2022-06-29 DIAGNOSIS — R1312 Dysphagia, oropharyngeal phase: Secondary | ICD-10-CM | POA: Diagnosis not present

## 2022-06-29 DIAGNOSIS — F0390 Unspecified dementia without behavioral disturbance: Secondary | ICD-10-CM | POA: Diagnosis not present

## 2022-06-30 DIAGNOSIS — F0390 Unspecified dementia without behavioral disturbance: Secondary | ICD-10-CM | POA: Diagnosis not present

## 2022-06-30 DIAGNOSIS — R1312 Dysphagia, oropharyngeal phase: Secondary | ICD-10-CM | POA: Diagnosis not present

## 2022-07-01 ENCOUNTER — Non-Acute Institutional Stay (SKILLED_NURSING_FACILITY): Payer: Medicare Other | Admitting: Orthopedic Surgery

## 2022-07-01 ENCOUNTER — Encounter: Payer: Self-pay | Admitting: Orthopedic Surgery

## 2022-07-01 DIAGNOSIS — R1312 Dysphagia, oropharyngeal phase: Secondary | ICD-10-CM | POA: Diagnosis not present

## 2022-07-01 DIAGNOSIS — Z0000 Encounter for general adult medical examination without abnormal findings: Secondary | ICD-10-CM

## 2022-07-01 DIAGNOSIS — F0390 Unspecified dementia without behavioral disturbance: Secondary | ICD-10-CM | POA: Diagnosis not present

## 2022-07-01 NOTE — Patient Instructions (Signed)
  Jaclyn Guzman , Thank you for taking time to come for your Medicare Wellness Visit. I appreciate your ongoing commitment to your health goals. Please review the following plan we discussed and let me know if I can assist you in the future.   These are the goals we discussed:  Goals      DIET - INCREASE WATER INTAKE     Maintain Mobility and Function     Evidence-based guidance:  Emphasize the importance of physical activity and aerobic exercise as included in treatment plan; assess barriers to adherence; consider patient's abilities and preferences.  Encourage gradual increase in activity or exercise instead of stopping if pain occurs.  Reinforce individual therapy exercise prescription, such as strengthening, stabilization and stretching programs.  Promote optimal body mechanics to stabilize the spine with lifting and functional activity.  Encourage activity and mobility modifications to facilitate optimal function, such as using a log roll for bed mobility or dressing from a seated position.  Reinforce individual adaptive equipment recommendations to limit excessive spinal movements, such as a Event organiser.  Assess adequacy of sleep; encourage use of sleep hygiene techniques, such as bedtime routine; use of white noise; dark, cool bedroom; avoiding daytime naps, heavy meals or exercise before bedtime.  Promote positions and modification to optimize sleep and sexual activity; consider pillows or positioning devices to assist in maintaining neutral spine.  Explore options for applying ergonomic principles at work and home, such as frequent position changes, using ergonomically designed equipment and working at optimal height.  Promote modifications to increase comfort with driving such as lumbar support, optimizing seat and steering wheel position, using cruise control and taking frequent rest stops to stretch and walk.   Notes:         This is a list of the screening recommended for you  and due dates:  Health Maintenance  Topic Date Due   Hepatitis C Screening: USPSTF Recommendation to screen - Ages 64-79 yo.  Never done   Zoster (Shingles) Vaccine (2 of 2) 08/26/2020   Flu Shot  06/09/2022   Complete foot exam   08/20/2022   Eye exam for diabetics  12/04/2022   Hemoglobin A1C  12/24/2022   Urine Protein Check  01/11/2023   Tetanus Vaccine  01/08/2032   Pneumonia Vaccine  Completed   DEXA scan (bone density measurement)  Completed   HPV Vaccine  Aged Out   COVID-19 Vaccine  Discontinued

## 2022-07-01 NOTE — Progress Notes (Signed)
Subjective:   Jaclyn Guzman is a 81 y.o. female who presents for Medicare Annual (Subsequent) preventive examination.  Place of Service: [facility]- skilled nursing Provider: Hazle Nordmann, AGNP-C   Review of Systems     Cardiac Risk Factors include: advanced age (>56men, >66 women);sedentary lifestyle;hypertension;diabetes mellitus     Objective:    Today's Vitals   07/01/22 1127  BP: 124/65  Pulse: 74  Resp: 20  Temp: 97.6 F (36.4 C)  SpO2: 97%  Weight: 135 lb 3.2 oz (61.3 kg)  Height: 5\' 3"  (1.6 m)   Body mass index is 23.95 kg/m.     07/01/2022   11:29 AM 06/17/2022    3:05 PM 06/11/2022   11:45 AM 05/18/2022   10:24 AM 05/11/2022   11:06 AM 04/13/2022    9:45 AM 03/18/2022   10:17 AM  Advanced Directives  Does Patient Have a Medical Advance Directive? Yes Yes Yes Yes Yes Yes Yes  Type of 05/18/2022 of [place];Living will;Out of facility DNR (pink MOST or yellow form) Healthcare Power of [place];Living will;Out of facility DNR (pink MOST or yellow form) Healthcare Power of [place];Living will;Out of facility DNR (pink MOST or yellow form) Healthcare Power of [place];Living will;Out of facility DNR (pink MOST or yellow form) Healthcare Power of [place];Living will;Out of facility DNR (pink MOST or yellow form) Healthcare Power of [place];Living will;Out of facility DNR (pink MOST or yellow form) Healthcare Power of [place];Living will;Out of facility DNR (pink MOST or yellow form)  Does patient want to make changes to medical advance directive? No - Patient declined No - Patient declined No - Patient declined No - Patient declined No - Patient declined No - Patient declined No - Patient declined  Copy of Healthcare Power of Attorney in Chart? Yes - validated most recent copy scanned in chart (See row information) Yes - validated most recent copy scanned in chart (See row information) Yes - validated most recent copy scanned in chart (See row  information) Yes - validated most recent copy scanned in chart (See row information) Yes - validated most recent copy scanned in chart (See row information) Yes - validated most recent copy scanned in chart (See row information) Yes - validated most recent copy scanned in chart (See row information)  Pre-existing out of facility DNR order (yellow form or pink MOST form)  Pink MOST form placed in chart (order not valid for inpatient use)    Pink MOST/Yellow Form most recent copy in chart - Physician notified to receive inpatient order Pink MOST/Yellow Form most recent copy in chart - Physician notified to receive inpatient order    Current Medications (verified) Outpatient Encounter Medications as of 07/01/2022  Medication Sig   acetaminophen (TYLENOL) 325 MG tablet Take 650 mg by mouth 2 (two) times daily.   atorvastatin (LIPITOR) 40 MG tablet Take 1 tablet (40 mg total) by mouth daily.   Calcium Carbonate-Vitamin D (CALTRATE 600+D PO) Take 1 tablet by mouth 2 (two) times daily.   cholecalciferol (VITAMIN D3) 25 MCG (1000 UNIT) tablet Take 1,000 Units by mouth daily.   clopidogrel (PLAVIX) 75 MG tablet Take 75 mg by mouth daily.   metFORMIN (GLUCOPHAGE) 1000 MG tablet Take 1,000 mg by mouth 2 (two) times daily with a meal.   sennosides-docusate sodium (SENOKOT-S) 8.6-50 MG tablet Take 1 tablet by mouth in the morning and at bedtime. Hold for loose stools   zinc oxide 20 % ointment Apply 1 application topically  as needed for irritation.   No facility-administered encounter medications on file as of 07/01/2022.    Allergies (verified) Augmentin [amoxicillin-pot clavulanate], Evista [raloxifene], Meloxicam, Scallops [shellfish allergy], and Toviaz [fesoterodine fumarate er]   History: Past Medical History:  Diagnosis Date   Diabetes mellitus    Dyslipidemia    Hypertension    Osteoporosis    Urinary incontinence    History reviewed. No pertinent surgical history. Family History  Problem  Relation Age of Onset   Fragile X syndrome Father    Healthy Sister    Breast cancer Neg Hx    Stroke Neg Hx    Social History   Socioeconomic History   Marital status: Single    Spouse name: Not on file   Number of children: 0   Years of education: Not on file   Highest education level: Master's degree (e.g., MA, MS, MEng, MEd, MSW, MBA)  Occupational History   Occupation: retired  Tobacco Use   Smoking status: Never   Smokeless tobacco: Never  Vaping Use   Vaping Use: Never used  Substance and Sexual Activity   Alcohol use: No   Drug use: No   Sexual activity: Not on file  Other Topics Concern   Not on file  Social History Narrative   Social Review      Patient lives at home alone   Pt lives in a one or two story home?one story home   Private home   What is patient highest level of education?Masters Degree   Is patient RIGHT or LEFT handed? RIGHT HANDED   Does patient drink coffee, tea, soda? YES    How much coffee, tea, soda does patient drink? SODA only at lunch, sometimes 1-2 times a week   Does patient exercise regularly? NO   Social Determinants of Health   Financial Resource Strain: Low Risk  (07/01/2022)   Overall Financial Resource Strain (CARDIA)    Difficulty of Paying Living Expenses: Not hard at all  Food Insecurity: No Food Insecurity (07/01/2022)   Hunger Vital Sign    Worried About Running Out of Food in the Last Year: Never true    Ran Out of Food in the Last Year: Never true  Transportation Needs: No Transportation Needs (07/01/2022)   PRAPARE - Administrator, Civil Service (Medical): No    Lack of Transportation (Non-Medical): No  Physical Activity: Inactive (07/01/2022)   Exercise Vital Sign    Days of Exercise per Week: 0 days    Minutes of Exercise per Session: 0 min  Stress: No Stress Concern Present (06/25/2021)   [employer] of Occupational Health - Occupational Stress Questionnaire    Feeling of Stress : Only a little   Social Connections: Socially Isolated (07/01/2022)   Social Connection and Isolation Panel [NHANES]    Frequency of Communication with Friends and Family: Never    Frequency of Social Gatherings with Friends and Family: Twice a week    Attends Religious Services: Never    Database administrator or Organizations: No    Attends Engineer, structural: Never    Marital Status: Never married    Tobacco Counseling Counseling given: Not Answered   Clinical Intake:  Pre-visit preparation completed: Yes  Pain : Faces Faces Pain Scale: No hurt  Faces Pain Scale: No hurt  BMI - recorded: 23.95 Nutritional Status: BMI of 19-24  Normal Nutritional Risks: None Diabetes: Yes CBG done?: Yes CBG resulted in Enter/ Edit results?: Yes (  150 06/29/2022) Did pt. bring in CBG monitor from home?: No  How often do you need to have someone help you when you read instructions, pamphlets, or other written materials from your doctor or pharmacy?: 5 - Always (neurocognitive disorder) What is the last grade level you completed in school?: unable to answer- nonverbal  Diabetic?Yes Interpreter Needed?: No      Activities of Daily Living    07/01/2022    2:03 PM  In your present state of health, do you have any difficulty performing the following activities:  Hearing? 0  Vision? 0  Difficulty concentrating or making decisions? 1  Walking or climbing stairs? 1  Dressing or bathing? 1  Doing errands, shopping? 1  Preparing Food and eating ? Y  Using the Toilet? Y  In the past six months, have you accidently leaked urine? Y  Do you have problems with loss of bowel control? Y  Managing your Medications? Y  Managing your Finances? Y  Housekeeping or managing your Housekeeping? Y    Patient Care Team: Mahlon Gammon, MD as PCP - General (Internal Medicine) [name], DO as Consulting Physician (Neurology)  Indicate any recent Medical Services you may have received from other than  [facility] providers in the past year (date may be approximate).     Assessment:   This is a routine wellness examination for Jaclyn Guzman.  Hearing/Vision screen No results found.  Dietary issues and exercise activities discussed: Current Exercise Habits: The patient does not participate in regular exercise at present, Exercise limited by: cardiac condition(s);neurologic condition(s);orthopedic condition(s)   Goals Addressed             This Visit's Progress    DIET - INCREASE WATER INTAKE   Not on track    Maintain Mobility and Function   Not on track    Evidence-based guidance:  Emphasize the importance of physical activity and aerobic exercise as included in treatment plan; assess barriers to adherence; consider patient's abilities and preferences.  Encourage gradual increase in activity or exercise instead of stopping if pain occurs.  Reinforce individual therapy exercise prescription, such as strengthening, stabilization and stretching programs.  Promote optimal body mechanics to stabilize the spine with lifting and functional activity.  Encourage activity and mobility modifications to facilitate optimal function, such as using a log roll for bed mobility or dressing from a seated position.  Reinforce individual adaptive equipment recommendations to limit excessive spinal movements, such as a Event organiser.  Assess adequacy of sleep; encourage use of sleep hygiene techniques, such as bedtime routine; use of white noise; dark, cool bedroom; avoiding daytime naps, heavy meals or exercise before bedtime.  Promote positions and modification to optimize sleep and sexual activity; consider pillows or positioning devices to assist in maintaining neutral spine.  Explore options for applying ergonomic principles at work and home, such as frequent position changes, using ergonomically designed equipment and working at optimal height.  Promote modifications to increase comfort with driving  such as lumbar support, optimizing seat and steering wheel position, using cruise control and taking frequent rest stops to stretch and walk.   Notes:        Depression Screen    07/01/2022    1:59 PM 06/25/2021    4:07 PM  PHQ 2/9 Scores  PHQ - 2 Score  1  Exception Documentation Medical reason     Fall Risk    07/01/2022    2:03 PM 06/25/2021    4:10 PM 03/05/2020  3:14 PM 09/06/2019   12:52 PM  Fall Risk   Falls in the past year? 1 1 1 1   Number falls in past yr: 1 0 0 0  Injury with Fall? 1 0 1 1  Risk for fall due to : History of fall(s);Impaired balance/gait;Impaired mobility History of fall(s);Impaired balance/gait;Impaired mobility    Follow up Falls evaluation completed;Education provided;Falls prevention discussed Falls evaluation completed;Education provided;Falls prevention discussed      FALL RISK PREVENTION PERTAINING TO THE HOME:  Any stairs in or around the home? No  If so, are there any without handrails? No  Home free of loose throw rugs in walkways, pet beds, electrical cords, etc? Yes  Adequate lighting in your home to reduce risk of falls? Yes   ASSISTIVE DEVICES UTILIZED TO PREVENT FALLS:  Life alert? No  Use of a cane, walker or w/c? Yes  Grab bars in the bathroom? Yes  Shower chair or bench in shower? Yes  Elevated toilet seat or a handicapped toilet? Yes   TIMED UP AND GO:  Was the test performed? No .  Length of time to ambulate 10 feet: N/A sec.   Gait slow and steady without use of assistive device  Cognitive Function:    07/01/2022    2:04 PM  MMSE - Mini Mental State Exam  Not completed: Unable to complete      03/22/2020   11:00 AM  Montreal Cognitive Assessment   Visuospatial/ Executive (0/5) 2  Naming (0/3) 3  Attention: Read list of digits (0/2) 2  Attention: Read list of letters (0/1) 0  Attention: Serial 7 subtraction starting at 100 (0/3) 2  Language: Repeat phrase (0/2) 1  Language : Fluency (0/1) 0  Abstraction  (0/2) 2  Delayed Recall (0/5) 2  Orientation (0/6) 6  Total 20      06/25/2021    4:12 PM  6CIT Screen  What Year? 4 points  What month? 3 points  What time? 0 points  Count back from 20 2 points  Months in reverse 2 points  Repeat phrase 2 points  Total Score 13 points    Immunizations Immunization History  Administered Date(s) Administered   Influenza Inj Mdck Quad Pf 08/10/2019   Influenza-Unspecified 08/13/2020, 09/02/2021   Moderna Covid-19 Vaccine Bivalent Booster 3021yrs & up 04/22/2022   Moderna SARS-COV2 Booster Vaccination 04/16/2021   Moderna Sars-Covid-2 Vaccination 12/11/2019, 01/08/2020, 04/22/2022   PNEUMOCOCCAL CONJUGATE-20 01/08/2022   Tdap 01/07/2022   Unspecified SARS-COV-2 Vaccination 09/23/2020, 07/30/2021   Zoster Recombinat (Shingrix) 07/01/2020   Zoster, Live 07/01/2020    TDAP status: Up to date  Flu Vaccine status: Due, Education has been provided regarding the importance of this vaccine. Advised may receive this vaccine at local pharmacy or Health Dept. Aware to provide a copy of the vaccination record if obtained from local pharmacy or Health Dept. Verbalized acceptance and understanding.  Pneumococcal vaccine status: Up to date  Covid-19 vaccine status: Completed vaccines  Qualifies for Shingles Vaccine? Yes   Zostavax completed Yes   Shingrix Completed?: No.    Education has been provided regarding the importance of this vaccine. Patient has been advised to call insurance company to determine out of pocket expense if they have not yet received this vaccine. Advised may also receive vaccine at local pharmacy or Health Dept. Verbalized acceptance and understanding.  Screening Tests Health Maintenance  Topic Date Due   Hepatitis C Screening  Never done   Zoster Vaccines- Shingrix (2 of 2)  08/26/2020   INFLUENZA VACCINE  06/09/2022   FOOT EXAM  08/20/2022   OPHTHALMOLOGY EXAM  12/04/2022   HEMOGLOBIN A1C  12/24/2022   URINE MICROALBUMIN   01/11/2023   TETANUS/TDAP  01/08/2032   Pneumonia Vaccine 22+ Years old  Completed   DEXA SCAN  Completed   HPV VACCINES  Aged Out   COVID-19 Vaccine  Discontinued    Health Maintenance  Health Maintenance Due  Topic Date Due   Hepatitis C Screening  Never done   Zoster Vaccines- Shingrix (2 of 2) 08/26/2020   INFLUENZA VACCINE  06/09/2022    Colorectal cancer screening: No longer required.   Mammogram status: No longer required due to advanced age.  Bone Density status: Completed 2001. Results reflect: Bone density results: OSTEOPOROSIS. Repeat every 2021- no further testing- non ambulatory years.  Lung Cancer Screening: (Low Dose CT Chest recommended if Age 72-80 years, 30 pack-year currently smoking OR have quit w/in 15years.) does not qualify.   Lung Cancer Screening Referral: No  Additional Screening:  Hepatitis C Screening: does qualify;ordered  Vision Screening: Recommended annual ophthalmology exams for early detection of glaucoma and other disorders of the eye. Is the patient up to date with their annual eye exam?  No  Who is the provider or what is the name of the office in which the patient attends annual eye exams? unknown If pt is not established with a provider, would they like to be referred to a provider to establish care? No .   Dental Screening: Recommended annual dental exams for proper oral hygiene  Community Resource Referral / Chronic Care Management: CRR required this visit?  No   CCM required this visit?  No      Plan:     I have personally reviewed and noted the following in the patient's chart:   Medical and social history Use of alcohol, tobacco or illicit drugs  Current medications and supplements including opioid prescriptions. Patient is not currently taking opioid prescriptions. Functional ability and status Nutritional status Physical activity Advanced directives List of other physicians Hospitalizations, surgeries, and ER  visits in previous 12 months Vitals Screenings to include cognitive, depression, and falls Referrals and appointments  In addition, I have reviewed and discussed with patient certain preventive protocols, quality metrics, and best practice recommendations. A written personalized care plan for preventive services as well as general preventive health recommendations were provided to patient.     Octavia Heir, NP   07/01/2022   Nurse Notes: hepatitis C screening next routine labs discuss Shingles vaccine with family

## 2022-07-01 NOTE — Progress Notes (Signed)
Location:  [facility] Room Number: 4-A Place of Service:  SNF [phone]) Provider: Hazle Nordmann, NP  Patient Care Team: Mahlon Gammon, MD as PCP - General (Internal Medicine) [name], DO as Consulting Physician (Neurology)  Extended Emergency Contact Information Primary Emergency Contact: [name],[name] Address: [address]          [place] [address] [place] Home Phone: [phone] Relation: Sister Secondary Emergency Contact: [facility] Address: [address] [place], [place] [address] [name] Mobile Phone: [phone] Relation: Brother  Code Status: DNR Goals of Care: Advanced Directive information    07/01/2022   11:29 AM  Advanced Directives  Does Patient Have a Medical Advance Directive? Yes  Type of Estate agent of [place];Living will;Out of facility DNR (pink MOST or yellow form)  Does patient want to make changes to medical advance directive? No - Patient declined  Copy of Healthcare Power of Attorney in Chart? Yes - validated most recent copy scanned in chart (See row information)     Chief Complaint  Patient presents with   Medicare Wellness    Medicare Annual Wellness.   Health Maintenance    Discuss the need for Hepatitis C Screening.    Immunizations    Discuss the need for 2nd Shingrix vaccine, and Influenza vaccine.    HPI: Patient is a 81 y.o. female seen in today for an annual wellness exam.       06/25/2021    4:07 PM  Depression screen PHQ 2/9  Decreased Interest 1  Down, Depressed, Hopeless 0  PHQ - 2 Score 1       07/07/2020    8:37 PM 07/08/2020    2:54 AM 07/08/2020   10:36 AM 07/10/2020   10:51 AM 06/25/2021    4:10 PM  Fall Risk  Falls in the past year?     1  Was there an injury with Fall?     0  Fall Risk Category Calculator     1  Fall Risk Category     Low  Patient Fall Risk Level High fall risk Low fall risk Moderate fall risk Moderate  fall risk Moderate fall risk  Patient at Risk for Falls Due to     History of fall(s);Impaired balance/gait;Impaired mobility  Fall risk Follow up     Falls evaluation completed;Education provided;Falls prevention discussed       No data to display           Health Maintenance  Topic Date Due   Hepatitis C Screening  Never done   Zoster Vaccines- Shingrix (2 of 2) 08/26/2020   INFLUENZA VACCINE  06/09/2022   FOOT EXAM  08/20/2022   OPHTHALMOLOGY EXAM  12/04/2022   HEMOGLOBIN A1C  12/24/2022   URINE MICROALBUMIN  01/11/2023   TETANUS/TDAP  01/08/2032   Pneumonia Vaccine 71+ Years old  Completed   DEXA SCAN  Completed   HPV VACCINES  Aged Out   COVID-19 Vaccine  Discontinued    Urinary incontinence? Functional Status Survey:   Exercise? Diet? No results found. Hearing:   Dentition: Pain:  Past Medical History:  Diagnosis Date   Diabetes mellitus    Dyslipidemia    Hypertension    Osteoporosis    Urinary incontinence     History reviewed. No pertinent surgical history.  Family History  Problem Relation Age of Onset   Fragile  X syndrome Father    Healthy Sister    Breast cancer Neg Hx    Stroke Neg Hx     Social History   Socioeconomic History   Marital status: Single    Spouse name: Not on file   Number of children: 0   Years of education: Not on file   Highest education level: Master's degree (e.g., MA, MS, MEng, MEd, MSW, MBA)  Occupational History   Occupation: retired  Tobacco Use   Smoking status: Never   Smokeless tobacco: Never  Vaping Use   Vaping Use: Never used  Substance and Sexual Activity   Alcohol use: No   Drug use: No   Sexual activity: Not on file  Other Topics Concern   Not on file  Social History Narrative   Social Review      Patient lives at home alone   Pt lives in a one or two story home?one story home   Private home   What is patient highest level of education?Masters Degree   Is patient RIGHT or LEFT handed?  RIGHT HANDED   Does patient drink coffee, tea, soda? YES    How much coffee, tea, soda does patient drink? SODA only at lunch, sometimes 1-2 times a week   Does patient exercise regularly? NO   Social Determinants of Health   Financial Resource Strain: Low Risk  (06/25/2021)   Overall Financial Resource Strain (CARDIA)    Difficulty of Paying Living Expenses: Not hard at all  Food Insecurity: No Food Insecurity (06/25/2021)   Hunger Vital Sign    Worried About Running Out of Food in the Last Year: Never true    Ran Out of Food in the Last Year: Never true  Transportation Needs: No Transportation Needs (06/25/2021)   PRAPARE - Administrator, Civil Service (Medical): No    Lack of Transportation (Non-Medical): No  Physical Activity: Inactive (06/25/2021)   Exercise Vital Sign    Days of Exercise per Week: 0 days    Minutes of Exercise per Session: 0 min  Stress: No Stress Concern Present (06/25/2021)   [employer] of Occupational Health - Occupational Stress Questionnaire    Feeling of Stress : Only a little  Social Connections: Socially Isolated (06/25/2021)   Social Connection and Isolation Panel [NHANES]    Frequency of Communication with Friends and Family: Three times a week    Frequency of Social Gatherings with Friends and Family: Three times a week    Attends Religious Services: Never    Active Member of Clubs or Organizations: No    Attends Banker Meetings: Never    Marital Status: Never married    reports that she has never smoked. She has never used smokeless tobacco. She reports that she does not drink alcohol and does not use drugs.   Allergies  Allergen Reactions   Augmentin [Amoxicillin-Pot Clavulanate] Hives   Evista [Raloxifene]    Meloxicam     Unknown reaction   Scallops [Shellfish Allergy] Other (See Comments)    "I don't feel so good after an hour"   Toviaz [Fesoterodine Fumarate Er]     Unknown reaction    Outpatient  Encounter Medications as of 07/01/2022  Medication Sig   acetaminophen (TYLENOL) 325 MG tablet Take 650 mg by mouth 2 (two) times daily.   atorvastatin (LIPITOR) 40 MG tablet Take 1 tablet (40 mg total) by mouth daily.   Calcium Carbonate-Vitamin D (CALTRATE 600+D PO) Take 1  tablet by mouth 2 (two) times daily.   cholecalciferol (VITAMIN D3) 25 MCG (1000 UNIT) tablet Take 1,000 Units by mouth daily.   clopidogrel (PLAVIX) 75 MG tablet Take 75 mg by mouth daily.   metFORMIN (GLUCOPHAGE) 1000 MG tablet Take 1,000 mg by mouth 2 (two) times daily with a meal.   sennosides-docusate sodium (SENOKOT-S) 8.6-50 MG tablet Take 1 tablet by mouth in the morning and at bedtime. Hold for loose stools   zinc oxide 20 % ointment Apply 1 application topically as needed for irritation.   No facility-administered encounter medications on file as of 07/01/2022.     Review of Systems:  Review of Systems  Physical Exam: Vitals:   07/01/22 1127  BP: 124/65  Pulse: 74  Resp: 20  Temp: 97.6 F (36.4 C)  SpO2: 97%  Weight: 135 lb 3.2 oz (61.3 kg)  Height: 5\' 3"  (1.6 m)   Body mass index is 23.95 kg/m. Physical Exam  Labs reviewed: Basic Metabolic Panel: Recent Labs    10/16/21 0000 02/09/22 0000 03/18/22 0000  NA 139 136* 138  K 3.9 3.9 4.2  CL 104 100 101  CO2 24* 27* 25*  BUN 16 16 21   CREATININE 0.8 0.8 0.8  CALCIUM 8.9 9.1 10.6  TSH 1.14  --   --    Liver Function Tests: Recent Labs    10/16/21 0000 02/09/22 0000  AST 12* 20  ALT 11 18  ALKPHOS 37 49  ALBUMIN 3.6 4.0   No results for input(s): "LIPASE", "AMYLASE" in the last 8760 hours. No results for input(s): "AMMONIA" in the last 8760 hours. CBC: Recent Labs    10/16/21 0000 02/09/22 0000 03/18/22 0000  WBC  --  6.5 6.3  NEUTROABS 2,576.00 5,434.00 3,944.00  HGB 11.9* 12.8 14.2  HCT 35* 38 42  PLT 224 204 343   Lipid Panel: Recent Labs    10/16/21 0000 06/23/22 0000  CHOL 131 147  HDL 37 36  LDLCALC 67 86   TRIG 196* 150   Lab Results  Component Value Date   HGBA1C 6.1 06/23/2022    Procedures: No results found.  Assessment/Plan There are no diagnoses linked to this encounter.   Labs/tests ordered:  * No order type specified * Next appt:  @NEXTENCTHIS  DEPT@

## 2022-07-02 DIAGNOSIS — F0390 Unspecified dementia without behavioral disturbance: Secondary | ICD-10-CM | POA: Diagnosis not present

## 2022-07-02 DIAGNOSIS — R1312 Dysphagia, oropharyngeal phase: Secondary | ICD-10-CM | POA: Diagnosis not present

## 2022-07-05 DIAGNOSIS — F0390 Unspecified dementia without behavioral disturbance: Secondary | ICD-10-CM | POA: Diagnosis not present

## 2022-07-05 DIAGNOSIS — R1312 Dysphagia, oropharyngeal phase: Secondary | ICD-10-CM | POA: Diagnosis not present

## 2022-07-06 DIAGNOSIS — F0390 Unspecified dementia without behavioral disturbance: Secondary | ICD-10-CM | POA: Diagnosis not present

## 2022-07-06 DIAGNOSIS — R1312 Dysphagia, oropharyngeal phase: Secondary | ICD-10-CM | POA: Diagnosis not present

## 2022-07-08 DIAGNOSIS — F0390 Unspecified dementia without behavioral disturbance: Secondary | ICD-10-CM | POA: Diagnosis not present

## 2022-07-08 DIAGNOSIS — R1312 Dysphagia, oropharyngeal phase: Secondary | ICD-10-CM | POA: Diagnosis not present

## 2022-07-09 DIAGNOSIS — R1312 Dysphagia, oropharyngeal phase: Secondary | ICD-10-CM | POA: Diagnosis not present

## 2022-07-09 DIAGNOSIS — F0390 Unspecified dementia without behavioral disturbance: Secondary | ICD-10-CM | POA: Diagnosis not present

## 2022-07-10 DIAGNOSIS — R1312 Dysphagia, oropharyngeal phase: Secondary | ICD-10-CM | POA: Diagnosis not present

## 2022-07-10 DIAGNOSIS — F0390 Unspecified dementia without behavioral disturbance: Secondary | ICD-10-CM | POA: Diagnosis not present

## 2022-07-13 DIAGNOSIS — R1312 Dysphagia, oropharyngeal phase: Secondary | ICD-10-CM | POA: Diagnosis not present

## 2022-07-13 DIAGNOSIS — F0390 Unspecified dementia without behavioral disturbance: Secondary | ICD-10-CM | POA: Diagnosis not present

## 2022-07-14 DIAGNOSIS — F0390 Unspecified dementia without behavioral disturbance: Secondary | ICD-10-CM | POA: Diagnosis not present

## 2022-07-14 DIAGNOSIS — R1312 Dysphagia, oropharyngeal phase: Secondary | ICD-10-CM | POA: Diagnosis not present

## 2022-07-15 ENCOUNTER — Non-Acute Institutional Stay (SKILLED_NURSING_FACILITY): Payer: Medicare Other | Admitting: Orthopedic Surgery

## 2022-07-15 ENCOUNTER — Encounter: Payer: Self-pay | Admitting: Orthopedic Surgery

## 2022-07-15 DIAGNOSIS — R1312 Dysphagia, oropharyngeal phase: Secondary | ICD-10-CM | POA: Diagnosis not present

## 2022-07-15 DIAGNOSIS — R270 Ataxia, unspecified: Secondary | ICD-10-CM

## 2022-07-15 DIAGNOSIS — R634 Abnormal weight loss: Secondary | ICD-10-CM | POA: Diagnosis not present

## 2022-07-15 DIAGNOSIS — E1149 Type 2 diabetes mellitus with other diabetic neurological complication: Secondary | ICD-10-CM | POA: Diagnosis not present

## 2022-07-15 DIAGNOSIS — D692 Other nonthrombocytopenic purpura: Secondary | ICD-10-CM

## 2022-07-15 DIAGNOSIS — E1169 Type 2 diabetes mellitus with other specified complication: Secondary | ICD-10-CM

## 2022-07-15 DIAGNOSIS — R419 Unspecified symptoms and signs involving cognitive functions and awareness: Secondary | ICD-10-CM

## 2022-07-15 DIAGNOSIS — Z8673 Personal history of transient ischemic attack (TIA), and cerebral infarction without residual deficits: Secondary | ICD-10-CM

## 2022-07-15 DIAGNOSIS — M810 Age-related osteoporosis without current pathological fracture: Secondary | ICD-10-CM

## 2022-07-15 DIAGNOSIS — E785 Hyperlipidemia, unspecified: Secondary | ICD-10-CM

## 2022-07-15 DIAGNOSIS — F0390 Unspecified dementia without behavioral disturbance: Secondary | ICD-10-CM | POA: Diagnosis not present

## 2022-07-15 DIAGNOSIS — I10 Essential (primary) hypertension: Secondary | ICD-10-CM

## 2022-07-15 NOTE — Progress Notes (Signed)
Location:  [facility] Room Number: 4/A Place of Service:  SNF [phone]) Provider:  Octavia Heir, NP   Mahlon Gammon, MD  Patient Care Team: Mahlon Gammon, MD as PCP - General (Internal Medicine) [name], DO as Consulting Physician (Neurology)  Extended Emergency Contact Information Primary Emergency Contact: [name],[name] Address: [address]          [place] [address] [place] Home Phone: [phone] Relation: Sister Secondary Emergency Contact: [facility] Address: [address] [place], [place] [address] [name] Mobile Phone: [phone] Relation: Brother  Code Status:  DNR Goals of care: Advanced Directive information    07/01/2022   11:29 AM  Advanced Directives  Does Patient Have a Medical Advance Directive? Yes  Type of Estate agent of [place];Living will;Out of facility DNR (pink MOST or yellow form)  Does patient want to make changes to medical advance directive? No - Patient declined  Copy of Healthcare Power of Attorney in Chart? Yes - validated most recent copy scanned in chart (See row information)     Chief Complaint  Patient presents with   Medical Management of Chronic Issues    HPI:  [name] seen today for medical management of chronic diseases.    She currently resides on the skilled nursing unit at [facility]. Past medical history includes: HTN, T2DM, constipation, neurocognitive disorder, osteoporosis, dyslipidemia, hx of lung nodule, lung nodule, and abnormal gait.   Dysphagia- choking episodes 06/2022, evaluated by ST, remains on regular foods with nectar thick liquids T2DM-  6.1 (08/15)> was  7.1 (01/15/2022)> was  8.2 10/2021, no recent hypoglycemic events, blood sugars averaging 130-160's, remains on metformin, regular diet Neurocognitive disorder- BIMS score 3/15 07/26 was 8 (01/23), requiring more assistance with ADLs, unstable  gait, nonverbal today, no behavioral outbursts Weight loss- see weights below, remains on milkshakes twice weekly HTN- BUN/creat 21/0.8 03/18/2022, off medications at this time HLD- LDL 86 06/23/2022, remains on Lipitor Hx of TIA- remains on Plavix and statin Osteoporosis- past use of Fosamax, family does not want further bone density tests, remains on caltrate  Recent blood pressures:  09/05- 116/73  08/29- 130/71  08/22- 124/65  Recent weights:  08/09- 135.2 lbs  07/26- 135.8 lbs  06/01- 134.1 lbs    Past Medical History:  Diagnosis Date   Diabetes mellitus    Dyslipidemia    Hypertension    Osteoporosis    Urinary incontinence    No past surgical history on file.  Allergies  Allergen Reactions   Augmentin [Amoxicillin-Pot Clavulanate] Hives   Evista [Raloxifene]    Meloxicam     Unknown reaction   Scallops [Shellfish Allergy] Other (See Comments)    "I don't feel so good after an hour"   Toviaz [Fesoterodine Fumarate Er]     Unknown reaction    Outpatient Encounter Medications as of 07/15/2022  Medication Sig   acetaminophen (TYLENOL) 325 MG tablet Take 650 mg by mouth 2 (two) times daily.   atorvastatin (LIPITOR) 40 MG tablet Take 1 tablet (40 mg total) by mouth daily.   Calcium Carbonate-Vitamin D (CALTRATE 600+D PO) Take 1 tablet by mouth 2 (two) times daily.   cholecalciferol (VITAMIN D3) 25 MCG (1000 UNIT) tablet Take 1,000 Units by mouth daily.   clopidogrel (PLAVIX) 75 MG tablet Take 75 mg by mouth daily.   metFORMIN (  GLUCOPHAGE) 1000 MG tablet Take 1,000 mg by mouth 2 (two) times daily with a meal.   sennosides-docusate sodium (SENOKOT-S) 8.6-50 MG tablet Take 1 tablet by mouth in the morning and at bedtime. Hold for loose stools   zinc oxide 20 % ointment Apply 1 application topically as needed for irritation.   No facility-administered encounter medications on file as of 07/15/2022.    Review of Systems  Unable to perform ROS: Dementia     Immunization History  Administered Date(s) Administered   Influenza Inj Mdck Quad Pf 08/10/2019   Influenza-Unspecified 08/13/2020, 09/02/2021   Moderna Covid-19 Vaccine Bivalent Booster 98yrs & up 04/22/2022   Moderna SARS-COV2 Booster Vaccination 04/16/2021   Moderna Sars-Covid-2 Vaccination 12/11/2019, 01/08/2020, 04/22/2022   PNEUMOCOCCAL CONJUGATE-20 01/08/2022   Tdap 01/07/2022   Unspecified SARS-COV-2 Vaccination 09/23/2020, 07/30/2021   Zoster Recombinat (Shingrix) 07/01/2020   Zoster, Live 07/01/2020   Pertinent  Health Maintenance Due  Topic Date Due   INFLUENZA VACCINE  06/09/2022   FOOT EXAM  08/20/2022   OPHTHALMOLOGY EXAM  12/04/2022   HEMOGLOBIN A1C  12/24/2022   URINE MICROALBUMIN  01/11/2023   DEXA SCAN  Completed      07/08/2020    2:54 AM 07/08/2020   10:36 AM 07/10/2020   10:51 AM 06/25/2021    4:10 PM 07/01/2022    2:03 PM  Fall Risk  Falls in the past year?    1 1  Was there an injury with Fall?    0 1  Fall Risk Category Calculator    1 3  Fall Risk Category    Low High  Patient Fall Risk Level Low fall risk Moderate fall risk Moderate fall risk Moderate fall risk High fall risk  Patient at Risk for Falls Due to    History of fall(s);Impaired balance/gait;Impaired mobility History of fall(s);Impaired balance/gait;Impaired mobility  Fall risk Follow up    Falls evaluation completed;Education provided;Falls prevention discussed Falls evaluation completed;Education provided;Falls prevention discussed   Functional Status Survey:    Vitals:   07/15/22 1044  BP: 116/73  Pulse: 97  Resp: 20  Temp: 97.6 F (36.4 C)  SpO2: 97%  Weight: 135 lb 3.2 oz (61.3 kg)  Height: 5\' 3"  (1.6 m)   Body mass index is 23.95 kg/m. Physical Exam Vitals reviewed.  Constitutional:      General: She is not in acute distress. HENT:     Head: Normocephalic.     Right Ear: There is no impacted cerumen.     Left Ear: There is no impacted cerumen.     Nose: Nose  normal.     Mouth/Throat:     Mouth: Mucous membranes are moist.  Eyes:     General:        Right eye: No discharge.        Left eye: No discharge.  Cardiovascular:     Rate and Rhythm: Normal rate and regular rhythm.     Pulses: Normal pulses.     Heart sounds: Normal heart sounds.  Pulmonary:     Effort: Pulmonary effort is normal.     Breath sounds: Normal breath sounds. No wheezing or rales.  Abdominal:     General: Abdomen is flat. Bowel sounds are normal. There is no distension.     Palpations: Abdomen is soft.     Tenderness: There is no abdominal tenderness.  Musculoskeletal:     Cervical back: Neck supple.     Right lower leg: No edema.  Left lower leg: No edema.  Skin:    General: Skin is warm and dry.     Capillary Refill: Capillary refill takes less than 2 seconds.     Comments: Non tender purple lesions to upper extremities, lesions vary on size, no skin breakdown  Neurological:     General: No focal deficit present.     Mental Status: She is alert. Mental status is at baseline.     Motor: Weakness present.     Gait: Gait abnormal.     Comments: wheelchair  Psychiatric:        Mood and Affect: Mood normal.        Behavior: Behavior normal.     Comments: Very pleasant, follows commands, nonverbal, alert to self     Labs reviewed: Recent Labs    10/16/21 0000 02/09/22 0000 03/18/22 0000  NA 139 136* 138  K 3.9 3.9 4.2  CL 104 100 101  CO2 24* 27* 25*  BUN 16 16 21   CREATININE 0.8 0.8 0.8  CALCIUM 8.9 9.1 10.6   Recent Labs    10/16/21 0000 02/09/22 0000  AST 12* 20  ALT 11 18  ALKPHOS 37 49  ALBUMIN 3.6 4.0   Recent Labs    10/16/21 0000 02/09/22 0000 03/18/22 0000  WBC  --  6.5 6.3  NEUTROABS 2,576.00 5,434.00 3,944.00  HGB 11.9* 12.8 14.2  HCT 35* 38 42  PLT 224 204 343   Lab Results  Component Value Date   TSH 1.14 10/16/2021   Lab Results  Component Value Date   HGBA1C 6.1 06/23/2022   Lab Results  Component Value  Date   CHOL 147 06/23/2022   HDL 36 06/23/2022   LDLCALC 86 06/23/2022   TRIG 150 06/23/2022   CHOLHDL 4.5 08/20/2019    Significant Diagnostic Results in last 30 days:  No results found.  Assessment/Plan 1. Oropharyngeal dysphagia - choking episodes 06/2022 - evaluated by ST - cont regular diet with nectar thick fluids  2. Type 2 diabetes mellitus with other neurologic complication, without long-term current use of insulin (HCC) - A1c trending down: 6.1 (08/15)> was  7.1 (01/15/2022)> was  8.2 10/2021 - associated with progressive weight loss - will reduce metformin to 500 mg po BID - recheck A1c in 3 months - last eye exam 12/18/2021  3. Neurocognitive disorder - mainly nonverbal - needs assistance with all ADLs except feeding - wheelchair dependent - cont skilled nursing care  4. Weight loss - weights stable - cont milkshakes twice weekly  5. Primary hypertension - controlled without medication  6. Hyperlipidemia associated with type 2 diabetes mellitus (HCC) - LDL stable - cont Lipitor  7. History of TIA (transient ischemic attack) - cont Plavix and statin  8. Age related osteoporosis, unspecified pathological fracture presence - no further studies per goals of care - cont caltrate  9. 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

## 2022-07-16 DIAGNOSIS — F0390 Unspecified dementia without behavioral disturbance: Secondary | ICD-10-CM | POA: Diagnosis not present

## 2022-07-16 DIAGNOSIS — R1312 Dysphagia, oropharyngeal phase: Secondary | ICD-10-CM | POA: Diagnosis not present

## 2022-07-17 DIAGNOSIS — R1312 Dysphagia, oropharyngeal phase: Secondary | ICD-10-CM | POA: Diagnosis not present

## 2022-07-17 DIAGNOSIS — F0390 Unspecified dementia without behavioral disturbance: Secondary | ICD-10-CM | POA: Diagnosis not present

## 2022-07-20 DIAGNOSIS — F0390 Unspecified dementia without behavioral disturbance: Secondary | ICD-10-CM | POA: Diagnosis not present

## 2022-07-20 DIAGNOSIS — R1312 Dysphagia, oropharyngeal phase: Secondary | ICD-10-CM | POA: Diagnosis not present

## 2022-07-24 DIAGNOSIS — R52 Pain, unspecified: Secondary | ICD-10-CM | POA: Diagnosis not present

## 2022-07-28 ENCOUNTER — Encounter: Payer: Self-pay | Admitting: Orthopedic Surgery

## 2022-07-28 ENCOUNTER — Non-Acute Institutional Stay (SKILLED_NURSING_FACILITY): Payer: Medicare Other | Admitting: Orthopedic Surgery

## 2022-07-28 DIAGNOSIS — S51011A Laceration without foreign body of right elbow, initial encounter: Secondary | ICD-10-CM | POA: Diagnosis not present

## 2022-07-28 DIAGNOSIS — U071 COVID-19: Secondary | ICD-10-CM | POA: Diagnosis not present

## 2022-07-28 DIAGNOSIS — R296 Repeated falls: Secondary | ICD-10-CM

## 2022-07-28 DIAGNOSIS — H05231 Hemorrhage of right orbit: Secondary | ICD-10-CM | POA: Diagnosis not present

## 2022-07-28 NOTE — Progress Notes (Signed)
Location:  [facility] Room Number: 4/A Place of Service:  SNF [phone]) Provider:  Yvonna Alanis, NP   [name], MD  Patient Care Team: [name], MD as PCP - General (Internal Medicine) [name], DO as Consulting Physician (Neurology)  Extended Emergency Contact Information Primary Emergency Contact: [name],[name] Address: [address]          [place] [address] [place] Phone: [phone] Relation: Sister Secondary Emergency Contact: [facility] Address: [address] [place], [place] [address] [name] of [place] Mobile Phone: [phone] Relation: Brother  Code Status:  DNR Goals of care: Advanced Directive information    07/01/2022   11:29 AM  Advanced Directives  Does Patient Have a Medical Advance Directive? Yes  Type of Paramedic of [place];Living will;Out of facility DNR (pink MOST or yellow form)  Does patient want to make changes to medical advance directive? No - Patient declined  Copy of [place] in Chart? Yes - validated most recent copy scanned in chart (See row information)     Chief Complaint  Patient presents with   Acute Visit    covid    HPI:  [name] seen today for acute visit due to positive covid test.   09/14 she was found on the floor in her room. She had a bruise to right eye and skin tear to right elbow. She was not sent to the ED. Neuro checks and vitals stable. She tested positive for covid later after incident. Symptoms include fatigue. No nasal congestion, cough, fever witnessed by staff. Eating and drinking well. She is a poor historian due to neurocognitive disorder. Nonverbal with me today. She is currently on isolation precautions per [facility] policy. She has received her initial covid vaccine and 3 boosters.    Past Medical History:  Diagnosis Date   Diabetes mellitus    Dyslipidemia     Hypertension    Osteoporosis    Urinary incontinence    No past surgical history on file.  Allergies  Allergen Reactions   Augmentin [Amoxicillin-Pot Clavulanate] Hives   Evista [Raloxifene]    Meloxicam     Unknown reaction   Scallops [Shellfish Allergy] Other (See Comments)    "I don't feel so good after an hour"   Toviaz [Fesoterodine Fumarate Er]     Unknown reaction    Outpatient Encounter Medications as of 07/28/2022  Medication Sig   acetaminophen (TYLENOL) 325 MG tablet Take 650 mg by mouth 2 (two) times daily.   atorvastatin (LIPITOR) 40 MG tablet Take 1 tablet (40 mg total) by mouth daily.   Calcium Carbonate-Vitamin D (CALTRATE 600+D PO) Take 1 tablet by mouth 2 (two) times daily.   cholecalciferol (VITAMIN D3) 25 MCG (1000 UNIT) tablet Take 1,000 Units by mouth daily.   clopidogrel (PLAVIX) 75 MG tablet Take 75 mg by mouth daily.   metFORMIN (GLUCOPHAGE) 1000 MG tablet Take 500 mg by mouth 2 (two) times daily with a meal.   sennosides-docusate sodium (SENOKOT-S) 8.6-50 MG tablet Take 1 tablet by mouth in the morning and at bedtime. Hold for loose stools   zinc oxide 20 % ointment Apply 1 application topically as needed for irritation.   No facility-administered encounter medications on file as of 07/28/2022.    Review of Systems  Unable to perform ROS: Dementia  Immunization History  Administered Date(s) Administered   Influenza Inj Mdck Quad Pf 08/10/2019   Influenza-Unspecified 08/13/2020, 09/02/2021   Moderna Covid-19 Vaccine Bivalent Booster 42yrs & up 04/22/2022   Moderna SARS-COV2 Booster Vaccination 04/16/2021   Moderna Sars-Covid-2 Vaccination 12/11/2019, 01/08/2020, 04/22/2022   PNEUMOCOCCAL CONJUGATE-20 01/08/2022   Tdap 01/07/2022   Unspecified SARS-COV-2 Vaccination 09/23/2020, 07/30/2021   Zoster Recombinat (Shingrix) 07/01/2020   Zoster, Live 07/01/2020   Pertinent  Health Maintenance Due  Topic Date Due   INFLUENZA VACCINE  06/09/2022    FOOT EXAM  08/20/2022   OPHTHALMOLOGY EXAM  12/04/2022   HEMOGLOBIN A1C  12/24/2022   DEXA SCAN  Completed      07/08/2020    2:54 AM 07/08/2020   10:36 AM 07/10/2020   10:51 AM 06/25/2021    4:10 PM 07/01/2022    2:03 PM  Fall Risk  Falls in the past year?    1 1  Was there an injury with Fall?    0 1  Fall Risk Category Calculator    1 3  Fall Risk Category    Low High  Patient Fall Risk Level Low fall risk Moderate fall risk Moderate fall risk Moderate fall risk High fall risk  Patient at Risk for Falls Due to    History of fall(s);Impaired balance/gait;Impaired mobility History of fall(s);Impaired balance/gait;Impaired mobility  Fall risk Follow up    Falls evaluation completed;Education provided;Falls prevention discussed Falls evaluation completed;Education provided;Falls prevention discussed   Functional Status Survey:    Vitals:   07/28/22 1201  BP: 120/60  Pulse: 77  Resp: (!) 22  Temp: 98 F (36.7 C)  SpO2: 95%  Weight: 135 lb 14.4 oz (61.6 kg)  Height: 5\' 3"  (1.6 m)   Body mass index is 24.07 kg/m. Physical Exam Vitals reviewed.  Constitutional:      General: She is not in acute distress. HENT:     Head: Right periorbital erythema present. No raccoon eyes or Battle's sign.     Comments: Increased swelling over right brow line, yellow/purple bruising to right brow line and under lower lid, no skin breakdown, surrounding skin intact    Right Ear: There is no impacted cerumen.     Left Ear: There is no impacted cerumen.     Nose: Nose normal. No congestion or rhinorrhea.     Mouth/Throat:     Mouth: Mucous membranes are moist.  Eyes:     General:        Right eye: No discharge.        Left eye: No discharge.     Pupils: Pupils are equal, round, and reactive to light.  Cardiovascular:     Rate and Rhythm: Normal rate and regular rhythm.     Pulses: Normal pulses.     Heart sounds: Normal heart sounds.  Pulmonary:     Effort: Pulmonary effort is normal. No  respiratory distress.     Breath sounds: Normal breath sounds. No wheezing or rales.  Abdominal:     General: Bowel sounds are normal. There is no distension.     Palpations: Abdomen is soft.     Tenderness: There is no abdominal tenderness.  Musculoskeletal:     Right shoulder: No swelling, deformity, tenderness or crepitus. Normal range of motion. Normal strength.     Right elbow: Laceration present. No swelling. Normal range of motion. No tenderness.     Cervical back: Neck supple.     Right lower leg: No edema.  Left lower leg: No edema.  Lymphadenopathy:     Cervical: No cervical adenopathy.  Skin:    General: Skin is warm and dry.     Capillary Refill: Capillary refill takes less than 2 seconds.     Findings: Lesion present.     Comments: Approx 2-3 cm skin tear to right elbow, covered with steri strips, no drianage, CDI, surrounding skin intact  Neurological:     General: No focal deficit present.     Mental Status: She is alert. Mental status is at baseline.     Motor: Weakness present.     Gait: Gait abnormal.     Comments: wheelchair  Psychiatric:        Mood and Affect: Mood normal.        Behavior: Behavior normal.     Comments: Nonverbal, followed some commands, alert to self     Labs reviewed: Recent Labs    10/16/21 0000 02/09/22 0000 03/18/22 0000  NA 139 136* 138  K 3.9 3.9 4.2  CL 104 100 101  CO2 24* 27* 25*  BUN 16 16 21   CREATININE 0.8 0.8 0.8  CALCIUM 8.9 9.1 10.6   Recent Labs    10/16/21 0000 02/09/22 0000  AST 12* 20  ALT 11 18  ALKPHOS 37 49  ALBUMIN 3.6 4.0   Recent Labs    10/16/21 0000 02/09/22 0000 03/18/22 0000  WBC  --  6.5 6.3  NEUTROABS 2,576.00 5,434.00 3,944.00  HGB 11.9* 12.8 14.2  HCT 35* 38 42  PLT 224 204 343   Lab Results  Component Value Date   TSH 1.14 10/16/2021   Lab Results  Component Value Date   HGBA1C 6.1 06/23/2022   Lab Results  Component Value Date   CHOL 147 06/23/2022   HDL 36  06/23/2022   LDLCALC 86 06/23/2022   TRIG 150 06/23/2022   CHOLHDL 4.5 08/20/2019    Significant Diagnostic Results in last 30 days:  No results found.  Assessment/Plan 1. COVID - 09/14 tested positive - symptoms: fatigue - cont isolation precautions- complete 09/24  2. Frequent falls - ongoing, last fall 09/14 - dependent with all ADLs, except feeding - cont falls safety precautions  3. Periorbital hematoma of right eye - mild swelling and bruising to right eye - cont ice compress prn  4. Skin tear of right elbow without complication, initial encounter - fall 09/14 - see above - cont steri strips, dressing changes prn    Family/ staff Communication: plan discussed with patient and nurse  Labs/tests ordered:  none

## 2022-08-10 ENCOUNTER — Encounter: Payer: Self-pay | Admitting: Orthopedic Surgery

## 2022-08-10 ENCOUNTER — Non-Acute Institutional Stay (SKILLED_NURSING_FACILITY): Payer: Medicare Other | Admitting: Orthopedic Surgery

## 2022-08-10 DIAGNOSIS — R1312 Dysphagia, oropharyngeal phase: Secondary | ICD-10-CM

## 2022-08-10 DIAGNOSIS — R419 Unspecified symptoms and signs involving cognitive functions and awareness: Secondary | ICD-10-CM

## 2022-08-10 DIAGNOSIS — R296 Repeated falls: Secondary | ICD-10-CM | POA: Diagnosis not present

## 2022-08-10 DIAGNOSIS — I10 Essential (primary) hypertension: Secondary | ICD-10-CM

## 2022-08-10 DIAGNOSIS — U071 COVID-19: Secondary | ICD-10-CM | POA: Diagnosis not present

## 2022-08-10 DIAGNOSIS — E785 Hyperlipidemia, unspecified: Secondary | ICD-10-CM

## 2022-08-10 DIAGNOSIS — R634 Abnormal weight loss: Secondary | ICD-10-CM

## 2022-08-10 DIAGNOSIS — M810 Age-related osteoporosis without current pathological fracture: Secondary | ICD-10-CM

## 2022-08-10 DIAGNOSIS — E1149 Type 2 diabetes mellitus with other diabetic neurological complication: Secondary | ICD-10-CM | POA: Diagnosis not present

## 2022-08-10 DIAGNOSIS — E1169 Type 2 diabetes mellitus with other specified complication: Secondary | ICD-10-CM

## 2022-08-10 DIAGNOSIS — Z8673 Personal history of transient ischemic attack (TIA), and cerebral infarction without residual deficits: Secondary | ICD-10-CM

## 2022-08-10 NOTE — Progress Notes (Addendum)
Location:  [facility] Room Number: 4/A Place of Service:  SNF [phone]) Provider:  Yvonna Alanis, NP   [name], MD  Patient Care Team: [name], MD as PCP - General (Internal Medicine) [name], DO as Consulting Physician (Neurology)  Extended Emergency Contact Information Primary Emergency Contact: [name],[name] Address: [address]          [place] [address] [place] Phone: [phone] Relation: Sister Secondary Emergency Contact: [facility] Address: [address] [place], [place] [address] [name] of [place] Mobile Phone: [phone] Relation: Brother  Code Status:  DNR Goals of care: Advanced Directive information    07/01/2022   11:29 AM  Advanced Directives  Does Patient Have a Medical Advance Directive? Yes  Type of Paramedic of [place];Living will;Out of facility DNR (pink MOST or yellow form)  Does patient want to make changes to medical advance directive? No - Patient declined  Copy of [place] in Chart? Yes - validated most recent copy scanned in chart (See row information)     Chief Complaint  Patient presents with   Medical Management of Chronic Issues    HPI:  [name] seen today for medical management of chronic diseases.    She currently resides on the skilled nursing unit at [facility]. Past medical history includes: HTN, T2DM, constipation, neurocognitive disorder, osteoporosis, dyslipidemia, hx of lung nodule, lung nodule, and abnormal gait.    Covid- tested positive 09/14, symptoms included fatigue, off isolation, no long term symptoms today Frequent falls- 09/30 found on the floor by staff, no apparent injury T2DM-  6.1 (08/15)> was  7.1 (01/15/2022)> was  8.2 10/2021, no recent hypoglycemic events, blood sugars averaging 130-170's, remains on metformin, regular diet Neurocognitive disorder- BIMS score  3/15 07/26 was 8/15 (01/23), requiring more assistance with ADLs, unstable gait, nonverbal today, no behavioral outbursts Weight loss- see weights below, needing more assistance with eating- staff having to put food to mouth at times to get her to eat, remains on milkshakes twice weekly Dysphagia- remains on regular foods with nectar thick liquids HTN- BUN/creat 21/0.8 03/18/2022, off medications at this time HLD- LDL 86 06/23/2022, remains on Lipitor Hx of TIA- remains on Plavix and statin Osteoporosis- past use of Fosamax, family does not want further bone density tests, remains on Caltrate  Recent weights:  09/06- 135.9 lbs  08/09- 135.2 lbs  07/26- 135.8 lbs  Recent blood pressures:  10/01- 120/76, 128/73  09/26- 115/63       Past Medical History:  Diagnosis Date   Diabetes mellitus    Dyslipidemia    Hypertension    Osteoporosis    Urinary incontinence    No past surgical history on file.  Allergies  Allergen Reactions   Augmentin [Amoxicillin-Pot Clavulanate] Hives   Evista [Raloxifene]    Meloxicam     Unknown reaction   Scallops [Shellfish Allergy] Other (See Comments)    "I don't feel so good after an hour"   Toviaz [Fesoterodine Fumarate Er]     Unknown reaction    Outpatient Encounter Medications as of 08/10/2022  Medication Sig   acetaminophen (TYLENOL) 325 MG tablet Take 650 mg by mouth 2 (two) times daily.   atorvastatin (LIPITOR) 40 MG tablet Take 1 tablet (40 mg total) by mouth daily.   Calcium Carbonate-Vitamin D (CALTRATE 600+D PO)  Take 1 tablet by mouth 2 (two) times daily.   cholecalciferol (VITAMIN D3) 25 MCG (1000 UNIT) tablet Take 1,000 Units by mouth daily.   clopidogrel (PLAVIX) 75 MG tablet Take 75 mg by mouth daily.   metFORMIN (GLUCOPHAGE) 1000 MG tablet Take 500 mg by mouth 2 (two) times daily with a meal.   sennosides-docusate sodium (SENOKOT-S) 8.6-50 MG tablet Take 1 tablet by mouth in the morning and at bedtime. Hold for loose stools    zinc oxide 20 % ointment Apply 1 application topically as needed for irritation.   No facility-administered encounter medications on file as of 08/10/2022.    Review of Systems  Unable to perform ROS: Patient nonverbal    Immunization History  Administered Date(s) Administered   Influenza Inj Mdck Quad Pf 08/10/2019   Influenza-Unspecified 08/13/2020, 09/02/2021   Moderna Covid-19 Vaccine Bivalent Booster 59yrs & up 04/22/2022   Moderna SARS-COV2 Booster Vaccination 04/16/2021   Moderna Sars-Covid-2 Vaccination 12/11/2019, 01/08/2020, 04/22/2022   PNEUMOCOCCAL CONJUGATE-20 01/08/2022   Tdap 01/07/2022   Unspecified SARS-COV-2 Vaccination 09/23/2020, 07/30/2021   Zoster Recombinat (Shingrix) 07/01/2020   Zoster, Live 07/01/2020   Pertinent  Health Maintenance Due  Topic Date Due   INFLUENZA VACCINE  06/09/2022   FOOT EXAM  08/20/2022   OPHTHALMOLOGY EXAM  12/04/2022   HEMOGLOBIN A1C  12/24/2022   DEXA SCAN  Completed      07/08/2020    2:54 AM 07/08/2020   10:36 AM 07/10/2020   10:51 AM 06/25/2021    4:10 PM 07/01/2022    2:03 PM  Fall Risk  Falls in the past year?    1 1  Was there an injury with Fall?    0 1  Fall Risk Category Calculator    1 3  Fall Risk Category    Low High  Patient Fall Risk Level Low fall risk Moderate fall risk Moderate fall risk Moderate fall risk High fall risk  Patient at Risk for Falls Due to    History of fall(s);Impaired balance/gait;Impaired mobility History of fall(s);Impaired balance/gait;Impaired mobility  Fall risk Follow up    Falls evaluation completed;Education provided;Falls prevention discussed Falls evaluation completed;Education provided;Falls prevention discussed   Functional Status Survey:    Vitals:   08/10/22 1307  BP: 120/76  Pulse: 73  Resp: 19  Temp: (!) 96.6 F (35.9 C)  SpO2: 95%  Weight: 135 lb 14.4 oz (61.6 kg)  Height: 5\' 3"  (1.6 m)   Body mass index is 24.07 kg/m. Physical Exam Vitals reviewed.   Constitutional:      General: She is not in acute distress. HENT:     Head: Normocephalic.     Right Ear: There is no impacted cerumen.     Left Ear: There is no impacted cerumen.     Nose: Nose normal.     Mouth/Throat:     Mouth: Mucous membranes are moist.  Eyes:     General:        Right eye: No discharge.        Left eye: No discharge.     Comments: Right eye bruising resolved  Cardiovascular:     Rate and Rhythm: Normal rate and regular rhythm.     Pulses: Normal pulses.     Heart sounds: Normal heart sounds.  Pulmonary:     Effort: Pulmonary effort is normal. No respiratory distress.     Breath sounds: Normal breath sounds. No wheezing.  Abdominal:     General: Bowel sounds  are normal. There is no distension.     Palpations: Abdomen is soft.     Tenderness: There is no abdominal tenderness.  Musculoskeletal:     Cervical back: Neck supple.     Right lower leg: No edema.     Left lower leg: No edema.  Skin:    General: Skin is warm and dry.     Capillary Refill: Capillary refill takes less than 2 seconds.  Neurological:     General: No focal deficit present.     Mental Status: She is alert. Mental status is at baseline.     Motor: Weakness present.     Gait: Gait abnormal.     Comments: wheelchair  Psychiatric:        Mood and Affect: Mood normal.        Behavior: Behavior normal.     Labs reviewed: Recent Labs    10/16/21 0000 02/09/22 0000 03/18/22 0000  NA 139 136* 138  K 3.9 3.9 4.2  CL 104 100 101  CO2 24* 27* 25*  BUN 16 16 21   CREATININE 0.8 0.8 0.8  CALCIUM 8.9 9.1 10.6   Recent Labs    10/16/21 0000 02/09/22 0000  AST 12* 20  ALT 11 18  ALKPHOS 37 49  ALBUMIN 3.6 4.0   Recent Labs    10/16/21 0000 02/09/22 0000 03/18/22 0000  WBC  --  6.5 6.3  NEUTROABS 2,576.00 5,434.00 3,944.00  HGB 11.9* 12.8 14.2  HCT 35* 38 42  PLT 224 204 343   Lab Results  Component Value Date   TSH 1.14 10/16/2021   Lab Results  Component  Value Date   HGBA1C 6.1 06/23/2022   Lab Results  Component Value Date   CHOL 147 06/23/2022   HDL 36 06/23/2022   LDLCALC 86 06/23/2022   TRIG 150 06/23/2022   CHOLHDL 4.5 08/20/2019    Significant Diagnostic Results in last 30 days:  No results found.  Assessment/Plan 1. COVID - tested positive 09/14 - resolved at this time   2. Frequent falls - ongoing - cont falls safety precautions - cont skilled nursing care  3. Type 2 diabetes mellitus with other neurologic complication, without long-term current use of insulin (HCC) - A1c 6.1 - no hypoglycemias - cont metformin- reduced last month - eye exam 12/18/2021  4. Neurocognitive disorder - nonverbal - dependent with all ADLs - now requiring assistance with meals - cont skilled nursing care  5. Weight loss - stable - BMI 24.07 - cont monthly weights  6. Oropharyngeal dysphagia - cont nectar thick fluids  7. Primary hypertension - controlled without medication  8. Hyperlipidemia associated with type 2 diabetes mellitus (HCC) - LDL stable - cont Lipitor  9. History of TIA (transient ischemic attack) - cont Plavix and statin  10. Age related osteoporosis, unspecified pathological fracture presence - no further DEXA scans per goals of care    Family/ staff Communication: plan discussed with patient and nurse  Labs/tests ordered: cbc/diff, cmp

## 2022-08-13 DIAGNOSIS — I10 Essential (primary) hypertension: Secondary | ICD-10-CM | POA: Diagnosis not present

## 2022-08-13 DIAGNOSIS — E1149 Type 2 diabetes mellitus with other diabetic neurological complication: Secondary | ICD-10-CM | POA: Diagnosis not present

## 2022-08-13 LAB — HEPATIC FUNCTION PANEL
ALT: 10 U/L (ref 7–35)
AST: 14 (ref 13–35)
Alkaline Phosphatase: 45 (ref 25–125)

## 2022-08-13 LAB — BASIC METABOLIC PANEL
BUN: 26 — AB (ref 4–21)
CO2: 29 — AB (ref 13–22)
Chloride: 105 (ref 99–108)
Creatinine: 0.8 (ref 0.5–1.1)
Glucose: 139
Sodium: 143 (ref 137–147)

## 2022-08-13 LAB — CBC AND DIFFERENTIAL
HCT: 41 (ref 36–46)
Hemoglobin: 13.5 (ref 12.0–16.0)
Neutrophils Absolute: 2857
WBC: 5.4

## 2022-08-13 LAB — COMPREHENSIVE METABOLIC PANEL
Albumin: 3.9 (ref 3.5–5.0)
Calcium: 9.6 (ref 8.7–10.7)
Globulin: 2.6
eGFR: 73

## 2022-08-13 LAB — CBC: RBC: 4.27 (ref 3.87–5.11)

## 2022-08-17 DIAGNOSIS — R1311 Dysphagia, oral phase: Secondary | ICD-10-CM | POA: Diagnosis not present

## 2022-08-18 DIAGNOSIS — R1311 Dysphagia, oral phase: Secondary | ICD-10-CM | POA: Diagnosis not present

## 2022-08-19 DIAGNOSIS — R1311 Dysphagia, oral phase: Secondary | ICD-10-CM | POA: Diagnosis not present

## 2022-08-20 DIAGNOSIS — R1311 Dysphagia, oral phase: Secondary | ICD-10-CM | POA: Diagnosis not present

## 2022-08-21 DIAGNOSIS — R1311 Dysphagia, oral phase: Secondary | ICD-10-CM | POA: Diagnosis not present

## 2022-08-24 DIAGNOSIS — R1311 Dysphagia, oral phase: Secondary | ICD-10-CM | POA: Diagnosis not present

## 2022-08-25 DIAGNOSIS — R1311 Dysphagia, oral phase: Secondary | ICD-10-CM | POA: Diagnosis not present

## 2022-08-26 DIAGNOSIS — R1311 Dysphagia, oral phase: Secondary | ICD-10-CM | POA: Diagnosis not present

## 2022-08-28 DIAGNOSIS — R1311 Dysphagia, oral phase: Secondary | ICD-10-CM | POA: Diagnosis not present

## 2022-08-31 DIAGNOSIS — R1311 Dysphagia, oral phase: Secondary | ICD-10-CM | POA: Diagnosis not present

## 2022-09-01 ENCOUNTER — Non-Acute Institutional Stay (SKILLED_NURSING_FACILITY): Payer: Medicare Other | Admitting: Adult Health

## 2022-09-01 ENCOUNTER — Encounter: Payer: Self-pay | Admitting: Adult Health

## 2022-09-01 DIAGNOSIS — R1311 Dysphagia, oral phase: Secondary | ICD-10-CM | POA: Diagnosis not present

## 2022-09-01 DIAGNOSIS — H6123 Impacted cerumen, bilateral: Secondary | ICD-10-CM | POA: Diagnosis not present

## 2022-09-01 MED ORDER — DEBROX 6.5 % OT SOLN: 5.0000 [drp] | Freq: Two times a day (BID) | OTIC | 0 refills | Status: AC

## 2022-09-01 NOTE — Progress Notes (Signed)
Location:  [facility] Room Number: 04-A Place of Service:  SNF (31) Provider:  Kenard Gower, DNP, FNP-BC  Patient Care Team: Mahlon Gammon, MD as PCP - General (Internal Medicine) [name], DO as Consulting Physician (Neurology)  Extended Emergency Contact Information Primary Emergency Contact: [name],[name] Address: [address]          [place] [address] [place] Home Phone: [phone] Relation: Sister Secondary Emergency Contact: [facility] Address: [address] [place], [place] [address] [name] Mobile Phone: [phone] Relation: Brother  Code Status:  DNR  Goals of care: Advanced Directive information    09/01/2022   11:23 AM  Advanced Directives  Does Patient Have a Medical Advance Directive? Yes  Type of Estate agent of [place];Living will;Out of facility DNR (pink MOST or yellow form)  Does patient want to make changes to medical advance directive? No - Patient declined  Copy of Healthcare Power of Attorney in Chart? Yes - validated most recent copy scanned in chart (See row information)  Pre-existing out of facility DNR order (yellow form or pink MOST form) Pink MOST form placed in chart (order not valid for inpatient use)     Chief Complaint  Patient presents with   Acute Visit    Pulling ear    HPI:  [name] seen today for an acute visit for pulling on ear per charge nurse.She has a PMH of hypertension, type 2 diabetes mellitus, constipation, neurocognitive disorder, osteoporosis, dyslipidemia, history of lung nodule and abnormal gait. She was seen in her room today. She denies pain on her ears when asked.  Bilateral ears noted to have moderate cerumen, R>L. No erythema nor drainage noted. No reported fever nor chills.   Past Medical History:  Diagnosis Date   Diabetes mellitus    Dyslipidemia    Hypertension    Osteoporosis     Urinary incontinence    History reviewed. No pertinent surgical history.  Allergies  Allergen Reactions   Augmentin [Amoxicillin-Pot Clavulanate] Hives   Evista [Raloxifene]    Meloxicam     Unknown reaction   Scallops [Shellfish Allergy] Other (See Comments)    "I don't feel so good after an hour"   Toviaz [Fesoterodine Fumarate Er]     Unknown reaction    Outpatient Encounter Medications as of 09/01/2022  Medication Sig   acetaminophen (TYLENOL) 325 MG tablet Take 650 mg by mouth 2 (two) times daily.   atorvastatin (LIPITOR) 40 MG tablet Take 1 tablet (40 mg total) by mouth daily.   Calcium Carbonate-Vitamin D (CALTRATE 600+D PO) Take 1 tablet by mouth 2 (two) times daily.   cholecalciferol (VITAMIN D3) 25 MCG (1000 UNIT) tablet Take 1,000 Units by mouth daily.   clopidogrel (PLAVIX) 75 MG tablet Take 75 mg by mouth daily.   metFORMIN (GLUCOPHAGE) 1000 MG tablet Take 500 mg by mouth 2 (two) times daily with a meal.   sennosides-docusate sodium (SENOKOT-S) 8.6-50 MG tablet Take 1 tablet by mouth in the morning and at bedtime. Hold for loose stools   zinc oxide 20 % ointment Apply 1 application topically as needed for irritation.   No facility-administered encounter medications on file as of 09/01/2022.    Review of Systems  Unable to obtain due to dementia.    Immunization History  Administered Date(s) Administered   Influenza Inj Mdck Quad Pf  08/10/2019   Influenza-Unspecified 08/13/2020, 09/02/2021   Moderna Covid-19 Vaccine Bivalent Booster 10yrs & up 04/22/2022   Moderna SARS-COV2 Booster Vaccination 04/16/2021   Moderna Sars-Covid-2 Vaccination 12/11/2019, 01/08/2020, 04/22/2022   PNEUMOCOCCAL CONJUGATE-20 01/08/2022   Tdap 01/07/2022   Unspecified SARS-COV-2 Vaccination 09/23/2020, 07/30/2021   Zoster Recombinat (Shingrix) 07/01/2020   Zoster, Live 07/01/2020   Pertinent  Health Maintenance Due  Topic Date Due   INFLUENZA VACCINE  06/09/2022   FOOT EXAM   08/20/2022   OPHTHALMOLOGY EXAM  12/04/2022   HEMOGLOBIN A1C  12/24/2022   DEXA SCAN  Completed      07/08/2020    2:54 AM 07/08/2020   10:36 AM 07/10/2020   10:51 AM 06/25/2021    4:10 PM 07/01/2022    2:03 PM  Fall Risk  Falls in the past year?    1 1  Was there an injury with Fall?    0 1  Fall Risk Category Calculator    1 3  Fall Risk Category    Low High  Patient Fall Risk Level Low fall risk Moderate fall risk Moderate fall risk Moderate fall risk High fall risk  Patient at Risk for Falls Due to    History of fall(s);Impaired balance/gait;Impaired mobility History of fall(s);Impaired balance/gait;Impaired mobility  Fall risk Follow up    Falls evaluation completed;Education provided;Falls prevention discussed Falls evaluation completed;Education provided;Falls prevention discussed     Vitals:   09/01/22 1117  BP: 128/62  Pulse: 75  Resp: 20  Temp: 97.6 F (36.4 C)  SpO2: 97%  Weight: 131 lb (59.4 kg)  Height: 5\' 3"  (1.6 m)   Body mass index is 23.21 kg/m.  Physical Exam Constitutional:      General: She is not in acute distress.    Appearance: Normal appearance.  HENT:     Head: Normocephalic and atraumatic.     Right Ear: There is impacted cerumen.     Left Ear: There is impacted cerumen.     Ears:     Comments: Bilateral ears with moderate cerumen, R>L.    Nose: Nose normal.     Mouth/Throat:     Mouth: Mucous membranes are moist.  Eyes:     Conjunctiva/sclera: Conjunctivae normal.  Cardiovascular:     Rate and Rhythm: Normal rate and regular rhythm.  Pulmonary:     Effort: Pulmonary effort is normal.     Breath sounds: Normal breath sounds.  Abdominal:     General: Bowel sounds are normal.     Palpations: Abdomen is soft.  Musculoskeletal:     Cervical back: Normal range of motion.     Right lower leg: No edema.     Left lower leg: No edema.  Skin:    General: Skin is warm and dry.  Neurological:     Mental Status: She is alert. Mental status is  at baseline. She is disoriented.  Psychiatric:        Mood and Affect: Mood normal.        Behavior: Behavior normal.        Labs reviewed: Recent Labs    10/16/21 0000 02/09/22 0000 03/18/22 0000  NA 139 136* 138  K 3.9 3.9 4.2  CL 104 100 101  CO2 24* 27* 25*  BUN 16 16 21   CREATININE 0.8 0.8 0.8  CALCIUM 8.9 9.1 10.6   Recent Labs    10/16/21 0000 02/09/22 0000  AST 12* 20  ALT 11 18  ALKPHOS 37 49  ALBUMIN 3.6 4.0   Recent Labs    10/16/21 0000 02/09/22 0000 03/18/22 0000  WBC  --  6.5 6.3  NEUTROABS 2,576.00 5,434.00 3,944.00  HGB 11.9* 12.8 14.2  HCT 35* 38 42  PLT 224 204 343   Lab Results  Component Value Date   TSH 1.14 10/16/2021   Lab Results  Component Value Date   HGBA1C 6.1 06/23/2022   Lab Results  Component Value Date   CHOL 147 06/23/2022   HDL 36 06/23/2022   LDLCALC 86 06/23/2022   TRIG 150 06/23/2022   CHOLHDL 4.5 08/20/2019    Significant Diagnostic Results in last 30 days:  No results found.  Assessment/Plan  1. Impacted cerumen of both ears -   noted to have moderate cerumen in bilateral ears, no erythema nor drainage - carbamide peroxide (DEBROX) 6.5 % OTIC solution; Place 5 drops into both ears 2 (two) times daily for 4 days.  Dispense: 2 mL; Refill: 0 -  Flush bilateral ears on 5th day   Family/ staff Communication: Discussed plan of care with resident and charge nurse.  Labs/tests ordered:   None    Kenard Gower, DNP, MSN, FNP-BC [facility] [phone] (Monday-Friday 8:00 a.m. - 5:00 p.m.) [phone] (after hours)

## 2022-09-02 DIAGNOSIS — R1311 Dysphagia, oral phase: Secondary | ICD-10-CM | POA: Diagnosis not present

## 2022-09-03 DIAGNOSIS — R1311 Dysphagia, oral phase: Secondary | ICD-10-CM | POA: Diagnosis not present

## 2022-09-04 DIAGNOSIS — R1311 Dysphagia, oral phase: Secondary | ICD-10-CM | POA: Diagnosis not present

## 2022-09-07 DIAGNOSIS — R1311 Dysphagia, oral phase: Secondary | ICD-10-CM | POA: Diagnosis not present

## 2022-09-08 DIAGNOSIS — R1311 Dysphagia, oral phase: Secondary | ICD-10-CM | POA: Diagnosis not present

## 2022-09-09 DIAGNOSIS — Z9181 History of falling: Secondary | ICD-10-CM | POA: Diagnosis not present

## 2022-09-09 DIAGNOSIS — M6281 Muscle weakness (generalized): Secondary | ICD-10-CM | POA: Diagnosis not present

## 2022-09-09 DIAGNOSIS — R1311 Dysphagia, oral phase: Secondary | ICD-10-CM | POA: Diagnosis not present

## 2022-09-09 DIAGNOSIS — R2681 Unsteadiness on feet: Secondary | ICD-10-CM | POA: Diagnosis not present

## 2022-09-10 ENCOUNTER — Non-Acute Institutional Stay (SKILLED_NURSING_FACILITY): Payer: Medicare Other | Admitting: Internal Medicine

## 2022-09-10 ENCOUNTER — Encounter: Payer: Self-pay | Admitting: Internal Medicine

## 2022-09-10 DIAGNOSIS — E1169 Type 2 diabetes mellitus with other specified complication: Secondary | ICD-10-CM

## 2022-09-10 DIAGNOSIS — Z8673 Personal history of transient ischemic attack (TIA), and cerebral infarction without residual deficits: Secondary | ICD-10-CM

## 2022-09-10 DIAGNOSIS — R1312 Dysphagia, oropharyngeal phase: Secondary | ICD-10-CM

## 2022-09-10 DIAGNOSIS — R2681 Unsteadiness on feet: Secondary | ICD-10-CM | POA: Diagnosis not present

## 2022-09-10 DIAGNOSIS — R419 Unspecified symptoms and signs involving cognitive functions and awareness: Secondary | ICD-10-CM | POA: Diagnosis not present

## 2022-09-10 DIAGNOSIS — E1149 Type 2 diabetes mellitus with other diabetic neurological complication: Secondary | ICD-10-CM

## 2022-09-10 DIAGNOSIS — E785 Hyperlipidemia, unspecified: Secondary | ICD-10-CM

## 2022-09-10 DIAGNOSIS — M6281 Muscle weakness (generalized): Secondary | ICD-10-CM | POA: Diagnosis not present

## 2022-09-10 DIAGNOSIS — I10 Essential (primary) hypertension: Secondary | ICD-10-CM | POA: Diagnosis not present

## 2022-09-10 DIAGNOSIS — Z9181 History of falling: Secondary | ICD-10-CM | POA: Diagnosis not present

## 2022-09-10 DIAGNOSIS — R1311 Dysphagia, oral phase: Secondary | ICD-10-CM | POA: Diagnosis not present

## 2022-09-10 NOTE — Progress Notes (Signed)
abstraction

## 2022-09-10 NOTE — Progress Notes (Signed)
[facility] Service:  SNF [id number]) Provider: Mahlon Gammon, MD   Jaclyn Gammon, MD  Patient Care Team: Jaclyn Gammon, MD as PCP - General (Internal Medicine) [name], DO as Consulting Physician (Neurology)  Extended Emergency Contact Information Primary Emergency Contact: Jaclyn Guzman,Jaclyn Guzman Address: [address]          [place] [address] [place] Home Phone: [phone] Relation: Sister Secondary Emergency Contact: Jaclyn Guzman Address: [address] [place], [place] [address] Jaclyn Guzman of [place] Mobile Phone: [phone] Relation: Brother  Code Status:  DNR Goals of care: Advanced Directive information    09/10/2022    3:18 PM  Advanced Directives  Does Patient Have a Medical Advance Directive? Yes  Type of Estate agent of [place];Living will;Out of facility DNR (pink MOST or yellow form)  Does patient want to make changes to medical advance directive? No - Guardian declined  Copy of Healthcare Power of Attorney in Chart? Yes - validated most recent copy scanned in chart (See row information)  Pre-existing out of facility DNR order (yellow form or pink MOST form) Pink MOST form placed in chart (order not valid for inpatient use)     Chief Complaint  Patient presents with   Medical Management of Chronic Issues    Routine Visit    Quality Metric Gaps    Discussed the need for Foot exam, Shingles, and Diabetic Kidney evaluation    HPI:  [name] seen today for medical management of chronic diseases.    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 No new Nursing issues. No Behavior issues Continues to be high risk for falls Sometimes slips from her Wheelchair.  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 No pertinent surgical history.  Allergies  Allergen Reactions   Augmentin [Amoxicillin-Pot Clavulanate] Hives   Evista [Raloxifene]    Meloxicam     Unknown reaction   Scallops [Shellfish Allergy] Other (See Comments)    "I don't feel so good after an hour"   Toviaz [Fesoterodine Fumarate Er]     Unknown reaction    Outpatient Encounter Medications as of 09/10/2022  Medication Sig   acetaminophen (TYLENOL) 325 MG tablet Take 650 mg by mouth 2 (two) times daily.   atorvastatin (LIPITOR) 40 MG tablet Take 1 tablet (40 mg total) by mouth daily.   Calcium Carbonate-Vitamin D (CALTRATE 600+D PO) Take 1 tablet by mouth 2 (two) times daily.   cholecalciferol (VITAMIN D3) 25 MCG (1000 UNIT) tablet Take 1,000 Units by mouth daily.   clopidogrel (PLAVIX) 75 MG tablet Take 75 mg by mouth daily.   metFORMIN (GLUCOPHAGE) 1000 MG tablet Take 500 mg by mouth 2 (two) times daily with a meal.   sennosides-docusate sodium (SENOKOT-S) 8.6-50 MG tablet Take 1 tablet by mouth in the morning and  at bedtime. Hold for loose stools   zinc oxide 20 % ointment Apply 1 application topically as needed for irritation.   No facility-administered encounter medications on file as of 09/10/2022.    Review of Systems  Unable to perform ROS: Patient nonverbal    Immunization History  Administered Date(s) Administered   Fluad Quad(high Dose 65+) 09/02/2022   Influenza Inj Mdck Quad Pf 08/10/2019   Influenza-Unspecified 08/13/2020, 09/02/2021   Moderna Covid-19 Vaccine Bivalent Booster 53yrs & up 04/22/2022   Moderna SARS-COV2 Booster Vaccination 04/16/2021   Moderna Sars-Covid-2 Vaccination  12/11/2019, 01/08/2020, 04/22/2022   PNEUMOCOCCAL CONJUGATE-20 01/08/2022   Tdap 01/07/2022   Unspecified SARS-COV-2 Vaccination 09/23/2020, 07/30/2021   Zoster Recombinat (Shingrix) 07/01/2020   Zoster, Live 07/01/2020   Pertinent  Health Maintenance Due  Topic Date Due   FOOT EXAM  08/20/2022   OPHTHALMOLOGY EXAM  12/04/2022   HEMOGLOBIN A1C  12/24/2022   INFLUENZA VACCINE  Completed   DEXA SCAN  Completed      07/08/2020    2:54 AM 07/08/2020   10:36 AM 07/10/2020   10:51 AM 06/25/2021    4:10 PM 07/01/2022    2:03 PM  Fall Risk  Falls in the past year?    1 1  Was there an injury with Fall?    0 1  Fall Risk Category Calculator    1 3  Fall Risk Category    Low High  Patient Fall Risk Level Low fall risk Moderate fall risk Moderate fall risk Moderate fall risk High fall risk  Patient at Risk for Falls Due to    History of fall(s);Impaired balance/gait;Impaired mobility History of fall(s);Impaired balance/gait;Impaired mobility  Fall risk Follow up    Falls evaluation completed;Education provided;Falls prevention discussed Falls evaluation completed;Education provided;Falls prevention discussed   Functional Status Survey:    Vitals:   09/10/22 1513  BP: 113/70  Pulse: 92  Resp: 20  Temp: (!) 97.2 F (36.2 C)  TempSrc: Temporal  SpO2: 95%  Weight: 135 lb 3.2 oz (61.3 kg)  Height: 5\' 3"  (1.6 m)   Body mass index is 23.95 kg/m. Physical Exam Vitals reviewed.  Constitutional:      Appearance: Normal appearance.  HENT:     Head: Normocephalic.     Nose: Nose normal.     Mouth/Throat:     Mouth: Mucous membranes are moist.     Pharynx: Oropharynx is clear.  Eyes:     Pupils: Pupils are equal, round, and reactive to light.  Cardiovascular:     Rate and Rhythm: Normal rate and regular rhythm.     Pulses: Normal pulses.     Heart sounds: Normal heart sounds. No murmur heard. Pulmonary:     Effort: Pulmonary effort is normal.     Breath sounds: Normal breath  sounds.  Abdominal:     General: Abdomen is flat. Bowel sounds are normal.     Palpations: Abdomen is soft.  Musculoskeletal:        General: No swelling.     Cervical back: Neck supple.  Skin:    General: Skin is warm.  Neurological:     General: No focal deficit present.     Mental Status: She is alert.  Psychiatric:        Mood and Affect: Mood normal.        Thought Content: Thought content normal.     Labs reviewed: Recent Labs    10/16/21 0000 02/09/22 0000 03/18/22 0000  NA  139 136* 138  K 3.9 3.9 4.2  CL 104 100 101  CO2 24* 27* 25*  BUN 16 16 21   CREATININE 0.8 0.8 0.8  CALCIUM 8.9 9.1 10.6   Recent Labs    10/16/21 0000 02/09/22 0000  AST 12* 20  ALT 11 18  ALKPHOS 37 49  ALBUMIN 3.6 4.0   Recent Labs    10/16/21 0000 02/09/22 0000 03/18/22 0000  WBC  --  6.5 6.3  NEUTROABS 2,576.00 5,434.00 3,944.00  HGB 11.9* 12.8 14.2  HCT 35* 38 42  PLT 224 204 343   Lab Results  Component Value Date   TSH 1.14 10/16/2021   Lab Results  Component Value Date   HGBA1C 6.1 06/23/2022   Lab Results  Component Value Date   CHOL 147 06/23/2022   HDL 36 06/23/2022   LDLCALC 86 06/23/2022   TRIG 150 06/23/2022   CHOLHDL 4.5 08/20/2019    Significant Diagnostic Results in last 30 days:  No results found.  Assessment/Plan 1. Type 2 diabetes mellitus with other neurologic complication, without long-term current use of insulin (HCC) A1C good limits On Metformin  2. Neurocognitive disorder Worsening now Expressive aphasia Continue SNF level of care 3. Oropharyngeal dysphagia On Nectar thick  Weight stable 4. Primary hypertension Off all meds  5. Hyperlipidemia associated with type 2 diabetes mellitus (HCC) On statin LDL 86 in 08/23  6. History of TIA (transient ischemic attack) On Plavix and Statins    Family/ staff Communication:   Labs/tests ordered:

## 2022-09-11 DIAGNOSIS — R2681 Unsteadiness on feet: Secondary | ICD-10-CM | POA: Diagnosis not present

## 2022-09-11 DIAGNOSIS — M6281 Muscle weakness (generalized): Secondary | ICD-10-CM | POA: Diagnosis not present

## 2022-09-11 DIAGNOSIS — R1311 Dysphagia, oral phase: Secondary | ICD-10-CM | POA: Diagnosis not present

## 2022-09-11 DIAGNOSIS — Z9181 History of falling: Secondary | ICD-10-CM | POA: Diagnosis not present

## 2022-09-13 DIAGNOSIS — M6281 Muscle weakness (generalized): Secondary | ICD-10-CM | POA: Diagnosis not present

## 2022-09-13 DIAGNOSIS — R2681 Unsteadiness on feet: Secondary | ICD-10-CM | POA: Diagnosis not present

## 2022-09-13 DIAGNOSIS — R1311 Dysphagia, oral phase: Secondary | ICD-10-CM | POA: Diagnosis not present

## 2022-09-13 DIAGNOSIS — Z9181 History of falling: Secondary | ICD-10-CM | POA: Diagnosis not present

## 2022-09-14 DIAGNOSIS — Z9181 History of falling: Secondary | ICD-10-CM | POA: Diagnosis not present

## 2022-09-14 DIAGNOSIS — R1311 Dysphagia, oral phase: Secondary | ICD-10-CM | POA: Diagnosis not present

## 2022-09-14 DIAGNOSIS — M6281 Muscle weakness (generalized): Secondary | ICD-10-CM | POA: Diagnosis not present

## 2022-09-14 DIAGNOSIS — R2681 Unsteadiness on feet: Secondary | ICD-10-CM | POA: Diagnosis not present

## 2022-09-15 DIAGNOSIS — M6281 Muscle weakness (generalized): Secondary | ICD-10-CM | POA: Diagnosis not present

## 2022-09-15 DIAGNOSIS — R2681 Unsteadiness on feet: Secondary | ICD-10-CM | POA: Diagnosis not present

## 2022-09-15 DIAGNOSIS — Z9181 History of falling: Secondary | ICD-10-CM | POA: Diagnosis not present

## 2022-09-15 DIAGNOSIS — R1311 Dysphagia, oral phase: Secondary | ICD-10-CM | POA: Diagnosis not present

## 2022-09-16 DIAGNOSIS — M6281 Muscle weakness (generalized): Secondary | ICD-10-CM | POA: Diagnosis not present

## 2022-09-16 DIAGNOSIS — Z9181 History of falling: Secondary | ICD-10-CM | POA: Diagnosis not present

## 2022-09-16 DIAGNOSIS — R1311 Dysphagia, oral phase: Secondary | ICD-10-CM | POA: Diagnosis not present

## 2022-09-16 DIAGNOSIS — R2681 Unsteadiness on feet: Secondary | ICD-10-CM | POA: Diagnosis not present

## 2022-09-17 DIAGNOSIS — R1311 Dysphagia, oral phase: Secondary | ICD-10-CM | POA: Diagnosis not present

## 2022-09-17 DIAGNOSIS — R2681 Unsteadiness on feet: Secondary | ICD-10-CM | POA: Diagnosis not present

## 2022-09-17 DIAGNOSIS — M6281 Muscle weakness (generalized): Secondary | ICD-10-CM | POA: Diagnosis not present

## 2022-09-17 DIAGNOSIS — Z9181 History of falling: Secondary | ICD-10-CM | POA: Diagnosis not present

## 2022-09-18 DIAGNOSIS — R1311 Dysphagia, oral phase: Secondary | ICD-10-CM | POA: Diagnosis not present

## 2022-09-18 DIAGNOSIS — Z9181 History of falling: Secondary | ICD-10-CM | POA: Diagnosis not present

## 2022-09-18 DIAGNOSIS — M6281 Muscle weakness (generalized): Secondary | ICD-10-CM | POA: Diagnosis not present

## 2022-09-18 DIAGNOSIS — R2681 Unsteadiness on feet: Secondary | ICD-10-CM | POA: Diagnosis not present

## 2022-09-21 DIAGNOSIS — M6281 Muscle weakness (generalized): Secondary | ICD-10-CM | POA: Diagnosis not present

## 2022-09-21 DIAGNOSIS — R2681 Unsteadiness on feet: Secondary | ICD-10-CM | POA: Diagnosis not present

## 2022-09-21 DIAGNOSIS — R1311 Dysphagia, oral phase: Secondary | ICD-10-CM | POA: Diagnosis not present

## 2022-09-21 DIAGNOSIS — Z9181 History of falling: Secondary | ICD-10-CM | POA: Diagnosis not present

## 2022-09-22 DIAGNOSIS — R2681 Unsteadiness on feet: Secondary | ICD-10-CM | POA: Diagnosis not present

## 2022-09-22 DIAGNOSIS — Z9181 History of falling: Secondary | ICD-10-CM | POA: Diagnosis not present

## 2022-09-22 DIAGNOSIS — M6281 Muscle weakness (generalized): Secondary | ICD-10-CM | POA: Diagnosis not present

## 2022-09-22 DIAGNOSIS — R1311 Dysphagia, oral phase: Secondary | ICD-10-CM | POA: Diagnosis not present

## 2022-09-23 DIAGNOSIS — R2681 Unsteadiness on feet: Secondary | ICD-10-CM | POA: Diagnosis not present

## 2022-09-23 DIAGNOSIS — R1311 Dysphagia, oral phase: Secondary | ICD-10-CM | POA: Diagnosis not present

## 2022-09-23 DIAGNOSIS — M6281 Muscle weakness (generalized): Secondary | ICD-10-CM | POA: Diagnosis not present

## 2022-09-23 DIAGNOSIS — Z9181 History of falling: Secondary | ICD-10-CM | POA: Diagnosis not present

## 2022-09-24 DIAGNOSIS — Z9181 History of falling: Secondary | ICD-10-CM | POA: Diagnosis not present

## 2022-09-24 DIAGNOSIS — M6281 Muscle weakness (generalized): Secondary | ICD-10-CM | POA: Diagnosis not present

## 2022-09-24 DIAGNOSIS — R2681 Unsteadiness on feet: Secondary | ICD-10-CM | POA: Diagnosis not present

## 2022-09-24 DIAGNOSIS — R1311 Dysphagia, oral phase: Secondary | ICD-10-CM | POA: Diagnosis not present

## 2022-09-25 DIAGNOSIS — Z9181 History of falling: Secondary | ICD-10-CM | POA: Diagnosis not present

## 2022-09-25 DIAGNOSIS — R1311 Dysphagia, oral phase: Secondary | ICD-10-CM | POA: Diagnosis not present

## 2022-09-25 DIAGNOSIS — R2681 Unsteadiness on feet: Secondary | ICD-10-CM | POA: Diagnosis not present

## 2022-09-25 DIAGNOSIS — M6281 Muscle weakness (generalized): Secondary | ICD-10-CM | POA: Diagnosis not present

## 2022-09-27 DIAGNOSIS — Z9181 History of falling: Secondary | ICD-10-CM | POA: Diagnosis not present

## 2022-09-27 DIAGNOSIS — M6281 Muscle weakness (generalized): Secondary | ICD-10-CM | POA: Diagnosis not present

## 2022-09-27 DIAGNOSIS — R1311 Dysphagia, oral phase: Secondary | ICD-10-CM | POA: Diagnosis not present

## 2022-09-27 DIAGNOSIS — R2681 Unsteadiness on feet: Secondary | ICD-10-CM | POA: Diagnosis not present

## 2022-09-28 DIAGNOSIS — M6281 Muscle weakness (generalized): Secondary | ICD-10-CM | POA: Diagnosis not present

## 2022-09-28 DIAGNOSIS — R2681 Unsteadiness on feet: Secondary | ICD-10-CM | POA: Diagnosis not present

## 2022-09-28 DIAGNOSIS — R1311 Dysphagia, oral phase: Secondary | ICD-10-CM | POA: Diagnosis not present

## 2022-09-28 DIAGNOSIS — Z9181 History of falling: Secondary | ICD-10-CM | POA: Diagnosis not present

## 2022-10-09 ENCOUNTER — Non-Acute Institutional Stay (SKILLED_NURSING_FACILITY): Payer: Medicare Other | Admitting: Orthopedic Surgery

## 2022-10-09 ENCOUNTER — Encounter: Payer: Self-pay | Admitting: Orthopedic Surgery

## 2022-10-09 DIAGNOSIS — R419 Unspecified symptoms and signs involving cognitive functions and awareness: Secondary | ICD-10-CM

## 2022-10-09 DIAGNOSIS — E1149 Type 2 diabetes mellitus with other diabetic neurological complication: Secondary | ICD-10-CM | POA: Diagnosis not present

## 2022-10-09 DIAGNOSIS — M810 Age-related osteoporosis without current pathological fracture: Secondary | ICD-10-CM

## 2022-10-09 DIAGNOSIS — I10 Essential (primary) hypertension: Secondary | ICD-10-CM

## 2022-10-09 DIAGNOSIS — Z8673 Personal history of transient ischemic attack (TIA), and cerebral infarction without residual deficits: Secondary | ICD-10-CM

## 2022-10-09 DIAGNOSIS — R1312 Dysphagia, oropharyngeal phase: Secondary | ICD-10-CM

## 2022-10-09 DIAGNOSIS — E785 Hyperlipidemia, unspecified: Secondary | ICD-10-CM

## 2022-10-09 DIAGNOSIS — R634 Abnormal weight loss: Secondary | ICD-10-CM | POA: Diagnosis not present

## 2022-10-09 DIAGNOSIS — E1169 Type 2 diabetes mellitus with other specified complication: Secondary | ICD-10-CM

## 2022-10-09 NOTE — Progress Notes (Signed)
Location:  [facility] Room Number: 04-A Place of Service:  SNF [phone]) Provider:  Hazle Nordmann, NP   Patient Care Team: Mahlon Gammon, MD as PCP - General (Internal Medicine) [name], DO as Consulting Physician (Neurology)  Extended Emergency Contact Information Primary Emergency Contact: [name],[name] Address: [address]          [place] [address] [place] Home Phone: [phone] Relation: Sister Secondary Emergency Contact: [facility] Address: [address] [place], [place] [address] [name] Mobile Phone: [phone] Relation: Brother  Code Status:  DNR Goals of care: Advanced Directive information    10/09/2022   10:18 AM  Advanced Directives  Does Patient Have a Medical Advance Directive? Yes  Type of Estate agent of [place];Living will;Out of facility DNR (pink MOST or yellow form)  Does patient want to make changes to medical advance directive? No - Patient declined  Copy of Healthcare Power of Attorney in Chart? Yes - validated most recent copy scanned in chart (See row information)  Pre-existing out of facility DNR order (yellow form or pink MOST form) Pink MOST form placed in chart (order not valid for inpatient use);Yellow form placed in chart (order not valid for inpatient use)     Chief Complaint  Patient presents with   Medical Management of Chronic Issues    Routine Visit with provider on site at the following facility     Quality Metric Gaps    Needs to Discuss Shingrix Vaccine, Diabetic Kidney evaluation, Foot Exam      HPI:  [name] seen today for medical management of chronic diseases.    She currently resides on the skilled nursing unit at [facility]. Past medical history includes: HTN, T2DM, constipation, neurocognitive disorder, osteoporosis, dyslipidemia, hx of lung nodule, lung nodule, and abnormal gait.    Neurocognitive  disorder- unable to complete BIMS 11/10> BIMS score was 3/15 (07/26), dependent with ADLs, no behaviors, nonverbal  Weight loss- see weights below, remains on BOOST milkshakes twice daily T2DM-  6.1 (08/15)> was  7.1 (01/15/2022)> was  8.2 10/2021, no recent hypoglycemic events, blood sugars averaging 130-160's, remains on metformin, regular diet HTN- BUN/creat 21/0.8 03/18/2022, off medications at this time HLD- LDL 86 06/23/2022, remains on Lipitor Hx of TIA- remains on Plavix and statin Osteoporosis- past use of Fosamax, family does not want further bone density tests, remains on caltrate Dysphagia- choking episodes 06/2022, no recent episodes, remains on regular foods with nectar thick liquids  No recent falls or injuries. Ambulates with wheelchair.   Recent blood pressures:  11/28- 109/60  11/21- 105/65  11/14- 119/56  Recent weights:  11/01- 135.2 lbs  10/09- 131 lbs  09/06- 135.9 lbs    Past Medical History:  Diagnosis Date   Diabetes mellitus    Dyslipidemia    Hypertension    Osteoporosis    Urinary incontinence    History reviewed. No pertinent surgical history.  Allergies  Allergen Reactions   Augmentin [Amoxicillin-Pot Clavulanate] Hives   Evista [Raloxifene]    Meloxicam     Unknown reaction   Scallops [Shellfish Allergy] Other (See Comments)    "I don't feel so good after an hour"   Toviaz [Fesoterodine Fumarate Er]     Unknown reaction    Outpatient Encounter Medications as of 10/09/2022  Medication Sig   acetaminophen (TYLENOL) 325 MG tablet  Take 650 mg by mouth 2 (two) times daily.   atorvastatin (LIPITOR) 40 MG tablet Take 1 tablet (40 mg total) by mouth daily.   Calcium Carbonate-Vitamin D (CALTRATE 600+D PO) Take 1 tablet by mouth 2 (two) times daily.   cholecalciferol (VITAMIN D3) 25 MCG (1000 UNIT) tablet Take 1,000 Units by mouth daily.   clopidogrel (PLAVIX) 75 MG tablet Take 75 mg by mouth daily.   metFORMIN (GLUCOPHAGE) 1000 MG tablet Take  500 mg by mouth 2 (two) times daily with a meal.   sennosides-docusate sodium (SENOKOT-S) 8.6-50 MG tablet Take 1 tablet by mouth in the morning and at bedtime. Hold for loose stools   zinc oxide 20 % ointment Apply 1 application topically as needed for irritation.   No facility-administered encounter medications on file as of 10/09/2022.    Review of Systems  Unable to perform ROS: Patient nonverbal    Immunization History  Administered Date(s) Administered   Fluad Quad(high Dose 65+) 09/02/2022   Influenza Inj Mdck Quad Pf 08/10/2019   Influenza-Unspecified 08/13/2020, 09/02/2021   Moderna Covid-19 Vaccine Bivalent Booster 2578yrs & up 04/22/2022   Moderna SARS-COV2 Booster Vaccination 04/16/2021   Moderna Sars-Covid-2 Vaccination 12/11/2019, 01/08/2020, 04/22/2022   PNEUMOCOCCAL CONJUGATE-20 01/08/2022   Tdap 01/07/2022   Unspecified SARS-COV-2 Vaccination 09/23/2020, 07/30/2021, 09/15/2022   Zoster Recombinat (Shingrix) 07/01/2020   Zoster, Live 07/01/2020   Pertinent  Health Maintenance Due  Topic Date Due   FOOT EXAM  08/20/2022   OPHTHALMOLOGY EXAM  12/04/2022   HEMOGLOBIN A1C  12/24/2022   INFLUENZA VACCINE  Completed   DEXA SCAN  Completed      07/08/2020   10:36 AM 07/10/2020   10:51 AM 06/25/2021    4:10 PM 07/01/2022    2:03 PM 10/09/2022   10:05 AM  Fall Risk  Falls in the past year?   1 1 0  Was there an injury with Fall?   0 1 0  Fall Risk Category Calculator   1 3 0  Fall Risk Category   Low High Low  Patient Fall Risk Level Moderate fall risk Moderate fall risk Moderate fall risk High fall risk High fall risk  Patient at Risk for Falls Due to   History of fall(s);Impaired balance/gait;Impaired mobility History of fall(s);Impaired balance/gait;Impaired mobility History of fall(s);Impaired balance/gait;Impaired mobility  Fall risk Follow up   Falls evaluation completed;Education provided;Falls prevention discussed Falls evaluation completed;Education  provided;Falls prevention discussed Falls evaluation completed   Functional Status Survey:    Vitals:   10/09/22 1009  BP: 109/60  Pulse: 79  Resp: 20  Temp: 97.9 F (36.6 C)  SpO2: 96%  Weight: 135 lb 2 oz (61.3 kg)  Height: 5\' 3"  (1.6 m)   Body mass index is 23.94 kg/m. Physical Exam Vitals reviewed.  Constitutional:      General: She is not in acute distress. HENT:     Head: Normocephalic.     Right Ear: There is no impacted cerumen.     Left Ear: There is no impacted cerumen.     Nose: Nose normal.     Mouth/Throat:     Mouth: Mucous membranes are moist.  Eyes:     General:        Right eye: No discharge.        Left eye: No discharge.  Cardiovascular:     Rate and Rhythm: Normal rate and regular rhythm.     Pulses: Normal pulses.  Dorsalis pedis pulses are 2+ on the right side and 2+ on the left side.       Posterior tibial pulses are 2+ on the right side and 2+ on the left side.     Heart sounds: Normal heart sounds.  Pulmonary:     Effort: Pulmonary effort is normal. No respiratory distress.     Breath sounds: Normal breath sounds. No wheezing.  Abdominal:     General: Bowel sounds are normal. There is no distension.     Palpations: Abdomen is soft.     Tenderness: There is no abdominal tenderness.  Musculoskeletal:     Cervical back: Neck supple.     Right lower leg: No edema.     Left lower leg: No edema.  Feet:     Right foot:     Skin integrity: Skin integrity normal.     Toenail Condition: Fungal disease present.    Left foot:     Skin integrity: Skin integrity normal.     Toenail Condition: Fungal disease present.    Comments: Unable to perform monofilament- patient nonverbal Skin:    General: Skin is warm and dry.     Capillary Refill: Capillary refill takes less than 2 seconds.  Neurological:     General: No focal deficit present.     Mental Status: She is alert. Mental status is at baseline.     Motor: Weakness present.      Gait: Gait abnormal.  Psychiatric:        Mood and Affect: Mood normal.        Behavior: Behavior normal.     Comments: Nonverbal, does not follow commands, alert to self     Labs reviewed: Recent Labs    10/16/21 0000 02/09/22 0000 03/18/22 0000  NA 139 136* 138  K 3.9 3.9 4.2  CL 104 100 101  CO2 24* 27* 25*  BUN 16 16 21   CREATININE 0.8 0.8 0.8  CALCIUM 8.9 9.1 10.6   Recent Labs    10/16/21 0000 02/09/22 0000  AST 12* 20  ALT 11 18  ALKPHOS 37 49  ALBUMIN 3.6 4.0   Recent Labs    10/16/21 0000 02/09/22 0000 03/18/22 0000  WBC  --  6.5 6.3  NEUTROABS 2,576.00 5,434.00 3,944.00  HGB 11.9* 12.8 14.2  HCT 35* 38 42  PLT 224 204 343   Lab Results  Component Value Date   TSH 1.14 10/16/2021   Lab Results  Component Value Date   HGBA1C 6.1 06/23/2022   Lab Results  Component Value Date   CHOL 147 06/23/2022   HDL 36 06/23/2022   LDLCALC 86 06/23/2022   TRIG 150 06/23/2022   CHOLHDL 4.5 08/20/2019    Significant Diagnostic Results in last 30 days:  No results found.  Assessment/Plan 1. Neurocognitive disorder - unable to complete BIMS - nonverbal - dependent with ADLs - no behaviors - cont skilled nursing  2. Weight loss - ongoing - followed by dietician - cont BOOST milkshakes bid - cont monthly weights  3. Type 2 diabetes mellitus with other neurologic complication, without long-term current use of insulin (HCC) - A1c 6.1> was 7.1 - progressive weight loss - will recheck A1c and plan to decrease metformin> 6.4 10/12/2022- will reduce evening metformin to 250 mg/ recheck A1c in 2-3 months  4. Primary hypertension - controlled without medication   5. Hyperlipidemia associated with type 2 diabetes mellitus (HCC) - cont statin  6. History of TIA (  transient ischemic attack) - cont plavix and statin  7. Age related osteoporosis, unspecified pathological fracture presence - no further DEXA - cont caltrate  8. Oropharyngeal  dysphagia - no recent aspirations - cont regular diet/nectar thick fluids    Family/ staff Communication: plan discussed with patient and nurse  Labs/tests ordered:  cbc/diff, cmp, A1c, urine microalbumin 12/04

## 2022-10-12 DIAGNOSIS — E119 Type 2 diabetes mellitus without complications: Secondary | ICD-10-CM | POA: Diagnosis not present

## 2022-10-13 LAB — BASIC METABOLIC PANEL
BUN: 18 (ref 4–21)
CO2: 25 — AB (ref 13–22)
Chloride: 104 (ref 99–108)
Creatinine: 0.8 (ref 0.5–1.1)
Glucose: 130
Potassium: 4 mEq/L (ref 3.5–5.1)
Sodium: 139 (ref 137–147)

## 2022-10-13 LAB — HEPATIC FUNCTION PANEL
ALT: 11 U/L (ref 7–35)
AST: 13 (ref 13–35)
Alkaline Phosphatase: 47 (ref 25–125)
Bilirubin, Total: 0.3

## 2022-10-13 LAB — COMPREHENSIVE METABOLIC PANEL
Albumin: 3.5 (ref 3.5–5.0)
Calcium: 9.4 (ref 8.7–10.7)
Globulin: 2.2
eGFR: 81

## 2022-10-13 LAB — HEMOGLOBIN A1C: Hemoglobin A1C: 6.4

## 2022-10-13 LAB — CBC AND DIFFERENTIAL
HCT: 35 — AB (ref 36–46)
Hemoglobin: 11.9 — AB (ref 12.0–16.0)
Platelets: 261 10*3/uL (ref 150–400)
WBC: 5.2

## 2022-10-13 LAB — CBC: RBC: 3.66 — AB (ref 3.87–5.11)

## 2022-10-14 MED ORDER — METFORMIN HCL 500 MG PO TABS: 250.0000 mg | ORAL_TABLET | Freq: Every day | ORAL | 3 refills | Status: DC

## 2022-10-14 NOTE — Addendum Note (Signed)
Addended byHazle Nordmann E on: 10/14/2022 08:56 AM   Modules accepted: Orders

## 2022-11-10 DIAGNOSIS — E119 Type 2 diabetes mellitus without complications: Secondary | ICD-10-CM | POA: Diagnosis not present

## 2022-11-10 DIAGNOSIS — Z961 Presence of intraocular lens: Secondary | ICD-10-CM | POA: Diagnosis not present

## 2022-11-10 DIAGNOSIS — H2511 Age-related nuclear cataract, right eye: Secondary | ICD-10-CM | POA: Diagnosis not present

## 2022-11-10 DIAGNOSIS — H16143 Punctate keratitis, bilateral: Secondary | ICD-10-CM | POA: Diagnosis not present

## 2022-11-11 ENCOUNTER — Encounter: Payer: Self-pay | Admitting: Orthopedic Surgery

## 2022-11-11 ENCOUNTER — Non-Acute Institutional Stay (SKILLED_NURSING_FACILITY): Payer: Medicare Other | Admitting: Orthopedic Surgery

## 2022-11-11 DIAGNOSIS — H6123 Impacted cerumen, bilateral: Secondary | ICD-10-CM | POA: Diagnosis not present

## 2022-11-11 DIAGNOSIS — R2681 Unsteadiness on feet: Secondary | ICD-10-CM | POA: Diagnosis not present

## 2022-11-11 DIAGNOSIS — I10 Essential (primary) hypertension: Secondary | ICD-10-CM

## 2022-11-11 DIAGNOSIS — Z8673 Personal history of transient ischemic attack (TIA), and cerebral infarction without residual deficits: Secondary | ICD-10-CM

## 2022-11-11 DIAGNOSIS — M810 Age-related osteoporosis without current pathological fracture: Secondary | ICD-10-CM

## 2022-11-11 DIAGNOSIS — R634 Abnormal weight loss: Secondary | ICD-10-CM

## 2022-11-11 DIAGNOSIS — E1169 Type 2 diabetes mellitus with other specified complication: Secondary | ICD-10-CM

## 2022-11-11 DIAGNOSIS — R419 Unspecified symptoms and signs involving cognitive functions and awareness: Secondary | ICD-10-CM | POA: Diagnosis not present

## 2022-11-11 DIAGNOSIS — D692 Other nonthrombocytopenic purpura: Secondary | ICD-10-CM

## 2022-11-11 DIAGNOSIS — E1149 Type 2 diabetes mellitus with other diabetic neurological complication: Secondary | ICD-10-CM

## 2022-11-11 DIAGNOSIS — R1312 Dysphagia, oropharyngeal phase: Secondary | ICD-10-CM

## 2022-11-11 DIAGNOSIS — E785 Hyperlipidemia, unspecified: Secondary | ICD-10-CM

## 2022-11-11 NOTE — Progress Notes (Signed)
Location:   [facility] Room Number: 04 A Place of Service:  SNF (31) Provider:  Hazle Nordmann, NP  Mahlon Gammon, MD  Patient Care Team: Mahlon Gammon, MD as PCP - General (Internal Medicine) [name], DO as Consulting Physician (Neurology)  Extended Emergency Contact Information Primary Emergency Contact: [name],[name] Address: [address]          [place] [address] [name] Home Phone: [phone] Relation: Sister Secondary Emergency Contact: [facility] Address: [address] [place], [place] [address] [name] Mobile Phone: [phone] Relation: Brother  Code Status:  DNR Goals of care: Advanced Directive information    11/11/2022    9:22 AM  Advanced Directives  Does Patient Have a Medical Advance Directive? Yes  Type of Estate agent of [place];Living will;Out of facility DNR (pink MOST or yellow form)  Does patient want to make changes to medical advance directive? No - Patient declined  Copy of Healthcare Power of Attorney in Chart? Yes - validated most recent copy scanned in chart (See row information)  Pre-existing out of facility DNR order (yellow form or pink MOST form) Pink MOST form placed in chart (order not valid for inpatient use)     Chief Complaint  Patient presents with   Medical Management of Chronic Issues    Routine follow up visit.   Immunizations    Shingrix vaccine due   Quality Metric Gaps    Diabetic kidney evaluation due    HPI:  Pt is a 82 y.o. female seen today for medical management of chronic diseases.    She currently resides on the skilled nursing unit at [facility]. Past medical history includes: HTN, T2DM, constipation, neurocognitive disorder, osteoporosis, dyslipidemia, hx of lung nodule, lung nodule, and abnormal gait.    Neurocognitive disorder- unable to complete BIMS 09/18/2022 > BIMS score was 3/15 (07/26), dependent with  ADLs, no behaviors, nonverbal  Unstable gait- ambulates with wheelchair, no recent falls Weight loss- see weights below, remains on BOOST milkshakes twice daily T2DM- 6.4 (12/04)> was 6.1 (08/15)> was  7.1 (01/15/2022)> was  8.2 10/2021, no recent hypoglycemic events, blood sugars averaging 110-160's, metformin reduced 10/09/2022 HTN- BUN/creat 26/0.8 08/13/2022, off medications at this time HLD- LDL 86 06/23/2022, remains on Lipitor Hx of TIA- remains on Plavix and statin Osteoporosis- past use of Fosamax, family does not want further bone density tests, remains on caltrate Dysphagia- no recent aspirations, remains on regular food with nectar thick liquids  Recent blood pressures:  01/02- 115/67  12/26- 131/71, 122/80  12/19- 107/58  Recent weights:  01/01- not recorded  12/06- 130.3 lbs  11/01- 135.2 lbs  10/09- 131 lbs  History reviewed. No pertinent surgical history.  Allergies  Allergen Reactions   Augmentin [Amoxicillin-Pot Clavulanate] Hives   Evista [Raloxifene]    Meloxicam     Unknown reaction   Scallops [Shellfish Allergy] Other (See Comments)    "I don't feel so good after an hour"   Toviaz [Fesoterodine Fumarate Er]     Unknown reaction    Allergies as of 11/11/2022       Reactions   Augmentin [amoxicillin-pot Clavulanate] Hives   Evista [raloxifene]    Meloxicam    Unknown reaction   Scallops [shellfish Allergy] Other (See Comments)   "I don't feel so good after an hour"   Toviaz [fesoterodine Fumarate Er]  Unknown reaction        Medication List        Accurate as of November 11, 2022  9:35 AM. If you have any questions, ask your nurse or doctor.          acetaminophen 325 MG tablet Commonly known as: TYLENOL Take 650 mg by mouth 2 (two) times daily.   atorvastatin 40 MG tablet Commonly known as: LIPITOR Take 1 tablet (40 mg total) by mouth daily.   CALTRATE 600+D PO Take 1 tablet by mouth 2 (two) times daily.   cholecalciferol 25  MCG (1000 UNIT) tablet Commonly known as: VITAMIN D3 Take 1,000 Units by mouth daily.   clopidogrel 75 MG tablet Commonly known as: PLAVIX Take 75 mg by mouth daily.   metFORMIN 500 MG tablet Commonly known as: GLUCOPHAGE Take 500 mg by mouth daily with breakfast.   metFORMIN 500 MG tablet Commonly known as: GLUCOPHAGE Take 0.5 tablets (250 mg total) by mouth daily with supper.   sennosides-docusate sodium 8.6-50 MG tablet Commonly known as: SENOKOT-S Take 1 tablet by mouth in the morning and at bedtime. Hold for loose stools   zinc oxide 20 % ointment Apply 1 application topically as needed for irritation.        Review of Systems  Unable to perform ROS: Patient nonverbal    Immunization History  Administered Date(s) Administered   Fluad Quad(high Dose 65+) 09/02/2022   Influenza Inj Mdck Quad Pf 08/10/2019   Influenza-Unspecified 08/13/2020, 09/02/2021   Moderna Covid-19 Vaccine Bivalent Booster 80yrs & up 04/22/2022   Moderna SARS-COV2 Booster Vaccination 04/16/2021   Moderna Sars-Covid-2 Vaccination 12/11/2019, 01/08/2020, 04/22/2022   PNEUMOCOCCAL CONJUGATE-20 01/08/2022   Tdap 01/07/2022   Unspecified SARS-COV-2 Vaccination 09/23/2020, 07/30/2021, 09/15/2022   Zoster Recombinat (Shingrix) 07/01/2020   Zoster, Live 07/01/2020   Pertinent  Health Maintenance Due  Topic Date Due   OPHTHALMOLOGY EXAM  12/04/2022   HEMOGLOBIN A1C  12/24/2022   FOOT EXAM  10/10/2023   INFLUENZA VACCINE  Completed   DEXA SCAN  Completed      07/08/2020   10:36 AM 07/10/2020   10:51 AM 06/25/2021    4:10 PM 07/01/2022    2:03 PM 10/09/2022   10:05 AM  Fall Risk  Falls in the past year?   1 1 0  Was there an injury with Fall?   0 1 0  Fall Risk Category Calculator   1 3 0  Fall Risk Category   Low High Low  Patient Fall Risk Level Moderate fall risk Moderate fall risk Moderate fall risk High fall risk High fall risk  Patient at Risk for Falls Due to   History of  fall(s);Impaired balance/gait;Impaired mobility History of fall(s);Impaired balance/gait;Impaired mobility History of fall(s);Impaired balance/gait;Impaired mobility  Fall risk Follow up   Falls evaluation completed;Education provided;Falls prevention discussed Falls evaluation completed;Education provided;Falls prevention discussed Falls evaluation completed   Functional Status Survey:    Vitals:   11/11/22 0911  BP: 115/67  Pulse: 72  Resp: 19  Temp: (!) 97.4 F (36.3 C)  SpO2: 96%  Weight: 130 lb 4.8 oz (59.1 kg)  Height: 5\' 3"  (1.6 m)   Body mass index is 23.08 kg/m. Physical Exam Vitals reviewed.  Constitutional:      General: She is not in acute distress. HENT:     Head: Normocephalic.     Right Ear: There is impacted cerumen.     Left Ear: There is impacted cerumen.  Nose: Nose normal.     Mouth/Throat:     Mouth: Mucous membranes are moist.  Eyes:     General:        Right eye: No discharge.        Left eye: No discharge.  Cardiovascular:     Rate and Rhythm: Normal rate and regular rhythm.     Pulses: Normal pulses.     Heart sounds: Normal heart sounds.  Pulmonary:     Effort: Pulmonary effort is normal.     Breath sounds: Normal breath sounds.  Abdominal:     General: Bowel sounds are normal. There is no distension.     Palpations: Abdomen is soft.     Tenderness: There is no abdominal tenderness.  Musculoskeletal:     Cervical back: Neck supple.     Right lower leg: No edema.     Left lower leg: No edema.  Skin:    General: Skin is warm and dry.     Capillary Refill: Capillary refill takes less than 2 seconds.  Neurological:     General: No focal deficit present.     Mental Status: She is alert. Mental status is at baseline.     Motor: Weakness present.     Gait: Gait abnormal.     Comments: Wheelchair, right hand weakness, right hand grip 4/5, left hand grip 5/5  Psychiatric:        Mood and Affect: Mood normal.     Comments: Nonverbal,  follows commands     Labs reviewed: Recent Labs    02/09/22 0000 03/18/22 0000  NA 136* 138  K 3.9 4.2  CL 100 101  CO2 27* 25*  BUN 16 21  CREATININE 0.8 0.8  CALCIUM 9.1 10.6   Recent Labs    02/09/22 0000  AST 20  ALT 18  ALKPHOS 49  ALBUMIN 4.0   Recent Labs    02/09/22 0000 03/18/22 0000  WBC 6.5 6.3  NEUTROABS 5,434.00 3,944.00  HGB 12.8 14.2  HCT 38 42  PLT 204 343   Lab Results  Component Value Date   TSH 1.14 10/16/2021   Lab Results  Component Value Date   HGBA1C 6.1 06/23/2022   Lab Results  Component Value Date   CHOL 147 06/23/2022   HDL 36 06/23/2022   LDLCALC 86 06/23/2022   TRIG 150 06/23/2022   CHOLHDL 4.5 08/20/2019    Significant Diagnostic Results in last 30 days:  No results found.  Assessment/Plan 1. Impacted cerumen of both ears - start debrox 5gtts- apply to both ears qhs x 5 days - flush ears with warm water when debrox complete  2. Neurocognitive disorder - no behaviors - non verbal - dependent with ADLs except feeding - losing weight- see below - cont skilled nursing  3. Unstable gait - no recent falls - uses wheelchair - cont skilled nursing care  4. Weight loss - 11/2022 weight not recorded - cont Boost  5. Type 2 diabetes mellitus with other neurologic complication, without long-term current use of insulin (HCC) - A1c 6.4 - metformin reduced 10/2022 - urine microalbumin- future  6. Primary hypertension - controlled without medication  7. Hyperlipidemia associated with type 2 diabetes mellitus (HCC) - LDL stable - cont atorvastatin  8. History of TIA (transient ischemic attack) - cont Plavix and statin  9. Age related osteoporosis, unspecified pathological fracture presence - no further studies per goals of care - cont caltrate  10. 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

## 2022-12-17 ENCOUNTER — Non-Acute Institutional Stay (SKILLED_NURSING_FACILITY): Payer: Medicare Other | Admitting: Internal Medicine

## 2022-12-17 ENCOUNTER — Encounter: Payer: Self-pay | Admitting: Internal Medicine

## 2022-12-17 DIAGNOSIS — E1149 Type 2 diabetes mellitus with other diabetic neurological complication: Secondary | ICD-10-CM

## 2022-12-17 DIAGNOSIS — R634 Abnormal weight loss: Secondary | ICD-10-CM

## 2022-12-17 DIAGNOSIS — R1312 Dysphagia, oropharyngeal phase: Secondary | ICD-10-CM

## 2022-12-17 DIAGNOSIS — R2681 Unsteadiness on feet: Secondary | ICD-10-CM

## 2022-12-17 DIAGNOSIS — R419 Unspecified symptoms and signs involving cognitive functions and awareness: Secondary | ICD-10-CM

## 2022-12-17 DIAGNOSIS — E1169 Type 2 diabetes mellitus with other specified complication: Secondary | ICD-10-CM

## 2022-12-17 DIAGNOSIS — Z8673 Personal history of transient ischemic attack (TIA), and cerebral infarction without residual deficits: Secondary | ICD-10-CM

## 2022-12-17 DIAGNOSIS — E785 Hyperlipidemia, unspecified: Secondary | ICD-10-CM

## 2022-12-17 NOTE — Progress Notes (Signed)
Location:  [place] Room Number: 4A Place of Service:  SNF [phone]) Provider:  [name], MD   [name], MD  Patient Care Team: [name], MD as PCP - General (Internal Medicine) [name], DO as Consulting Physician (Neurology)  Extended Emergency Contact Information Primary Emergency Contact: [name],[name] Address: [address]          [place] [address] [place] Phone: [phone] Relation: Sister Secondary Emergency Contact: [facility] Address: [address] [place], [place] [address] [name] of [place] Mobile Phone: [phone] Relation: Brother  Code Status:  DNR Goals of care: Advanced Directive information    12/17/2022    1:51 PM  Advanced Directives  Does Patient Have a Medical Advance Directive? Yes  Type of Paramedic of [place];Living will;Out of facility DNR (pink MOST or yellow form)  Does patient want to make changes to medical advance directive? No - Patient declined  Copy of [place] in Chart? Yes - validated most recent copy scanned in chart (See row information)  Pre-existing out of facility DNR order (yellow form or pink MOST form) Pink MOST form placed in chart (order not valid for inpatient use)     Chief Complaint  Patient presents with   Medical Management of Chronic Issues    Routine visit   Quality Metric Gaps    Discussed the need for Diabetic kidney evaluation and shingles vaccine     HPI:  Pt is a 82 y.o. female seen today for medical management of chronic diseases.    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 No new Nursing issues. No Behavior issues Her weight is stable. Metformin was reduced as A1c was low Stays in her  wheelchair On Nectar Thick Liquids Has Complete Aphasia but was able to follow some commands today Noticed some Hand Contractures No Falls Wt Readings from Last 3 Encounters:  12/17/22 138 lb (62.6 kg)  11/11/22 130 lb 4.8 oz (59.1 kg)  10/09/22 135 lb 2 oz (61.3 kg)   Past Medical History:  Diagnosis Date   Diabetes mellitus    Dyslipidemia    Hypertension    Osteoporosis    Urinary incontinence    History reviewed. No pertinent surgical history.  Allergies  Allergen Reactions   Augmentin [Amoxicillin-Pot Clavulanate] Hives   Evista [Raloxifene]    Meloxicam     Unknown reaction   Scallops [Shellfish Allergy] Other (See Comments)    "I don't feel so good after an hour"   Toviaz [Fesoterodine Fumarate Er]     Unknown reaction    Outpatient Encounter Medications as of 12/17/2022  Medication Sig   acetaminophen (TYLENOL) 325 MG tablet Take 650 mg by mouth 2 (two) times daily.   atorvastatin (LIPITOR) 40 MG tablet Take 1 tablet (40 mg total) by mouth daily.   Calcium Carbonate-Vitamin D (CALTRATE 600+D PO) Take 1 tablet by mouth 2 (two) times daily.   cholecalciferol (VITAMIN D3) 25 MCG (1000 UNIT) tablet Take 1,000 Units by mouth daily.   clopidogrel (PLAVIX) 75 MG tablet Take 75 mg by mouth daily.   metFORMIN (GLUCOPHAGE) 500 MG tablet Take 500 mg by mouth daily with breakfast.   metFORMIN (GLUCOPHAGE)  500 MG tablet Take 0.5 tablets (250 mg total) by mouth daily with supper.   sennosides-docusate sodium (SENOKOT-S) 8.6-50 MG tablet Take 1 tablet by mouth in the morning and at bedtime. Hold for loose stools   zinc oxide 20 % ointment Apply 1 application topically as needed for irritation.   No facility-administered encounter medications on file as of 12/17/2022.    Review of Systems  Unable to perform ROS: Dementia    Immunization History  Administered Date(s) Administered   Fluad Quad(high Dose 65+) 09/02/2022   Influenza Inj Mdck Quad Pf 08/10/2019    Influenza-Unspecified 08/13/2020, 09/02/2021   Moderna Covid-19 Vaccine Bivalent Booster 25yr & up 04/22/2022   Moderna SARS-COV2 Booster Vaccination 04/16/2021   Moderna Sars-Covid-2 Vaccination 12/11/2019, 01/08/2020, 04/22/2022   PNEUMOCOCCAL CONJUGATE-20 01/08/2022   Tdap 01/07/2022   Unspecified SARS-COV-2 Vaccination 09/23/2020, 07/30/2021, 09/15/2022   Zoster Recombinat (Shingrix) 07/01/2020   Zoster, Live 07/01/2020   Pertinent  Health Maintenance Due  Topic Date Due   HEMOGLOBIN A1C  12/24/2022   FOOT EXAM  10/10/2023   OPHTHALMOLOGY EXAM  11/11/2023   INFLUENZA VACCINE  Completed   DEXA SCAN  Completed      07/08/2020   10:36 AM 07/10/2020   10:51 AM 06/25/2021    4:10 PM 07/01/2022    2:03 PM 10/09/2022   10:05 AM  Fall Risk  Falls in the past year?   1 1 0  Was there an injury with Fall?   0 1 0  Fall Risk Category Calculator   1 3 0  Fall Risk Category (Retired)   Low High Low  (RETIRED) Patient Fall Risk Level Moderate fall risk Moderate fall risk Moderate fall risk High fall risk High fall risk  Patient at Risk for Falls Due to   History of fall(s);Impaired balance/gait;Impaired mobility History of fall(s);Impaired balance/gait;Impaired mobility History of fall(s);Impaired balance/gait;Impaired mobility  Fall risk Follow up   Falls evaluation completed;Education provided;Falls prevention discussed Falls evaluation completed;Education provided;Falls prevention discussed Falls evaluation completed   Functional Status Survey:    Vitals:   12/17/22 1348  BP: 118/79  Pulse: 82  Resp: 20  Temp: 97.8 F (36.6 C)  TempSrc: Temporal  SpO2: 96%  Weight: 138 lb (62.6 kg)  Height: 5' 3"$  (1.6 m)   Body mass index is 24.45 kg/m. Physical Exam Vitals reviewed.  Constitutional:      Appearance: Normal appearance.  HENT:     Head: Normocephalic.     Nose: Nose normal.     Mouth/Throat:     Mouth: Mucous membranes are moist.     Pharynx: Oropharynx is clear.   Eyes:     Pupils: Pupils are equal, round, and reactive to light.  Cardiovascular:     Rate and Rhythm: Normal rate and regular rhythm.     Pulses: Normal pulses.     Heart sounds: Normal heart sounds. No murmur heard. Pulmonary:     Effort: Pulmonary effort is normal.     Breath sounds: Normal breath sounds.  Abdominal:     General: Abdomen is flat. Bowel sounds are normal.     Palpations: Abdomen is soft.  Musculoskeletal:        General: No swelling.     Cervical back: Neck supple.  Skin:    General: Skin is warm.  Neurological:     General: No focal deficit present.     Mental Status: She is alert.     Comments: Noticed some Curling fingers in  [place] but does not talk anymore Follows commands  Psychiatric:        Mood and Affect: Mood normal.        Thought Content: Thought content normal.     Labs reviewed: Recent Labs    02/09/22 0000 03/18/22 0000 08/13/22 0000  NA 136* 138 143  K 3.9 4.2  --   CL 100 101 105  CO2 27* 25* 29*  BUN 16 21 26*  CREATININE 0.8 0.8 0.8  CALCIUM 9.1 10.6 9.6   Recent Labs    02/09/22 0000 08/13/22 0000  AST 20 14  ALT 18 10  ALKPHOS 49 45  ALBUMIN 4.0 3.9   Recent Labs    02/09/22 0000 03/18/22 0000 08/13/22 0000  WBC 6.5 6.3 5.4  NEUTROABS 5,434.00 3,944.00 2,857.00  HGB 12.8 14.2 13.5  HCT 38 42 41  PLT 204 343  --    Lab Results  Component Value Date   TSH 1.14 10/16/2021   Lab Results  Component Value Date   HGBA1C 6.1 06/23/2022   Lab Results  Component Value Date   CHOL 147 06/23/2022   HDL 36 06/23/2022   LDLCALC 86 06/23/2022   TRIG 150 06/23/2022   CHOLHDL 4.5 08/20/2019    Significant Diagnostic Results in last 30 days:  No results found.  Assessment/Plan 1. Neurocognitive disorder Worsening Aphasia Total Dependent for ADLS  2. Unstable gait High Fall risk  3. Weight loss Stabilize On Supplements  4. Type 2 diabetes mellitus with other neurologic complication, without  long-term current use of insulin (HCC) A1C was low and Now on lower dose of Metformin  5. Hyperlipidemia associated with type 2 diabetes mellitus ([place]) On statin LDL 83 in 5 /23  6. History of TIA (transient ischemic attack) Plavix and statin  7. Oropharyngeal dysphagia Nectar Thick Liquid    Family/ staff Communication:   Labs/tests ordered:

## 2022-12-29 DIAGNOSIS — M6281 Muscle weakness (generalized): Secondary | ICD-10-CM | POA: Diagnosis not present

## 2022-12-29 DIAGNOSIS — R279 Unspecified lack of coordination: Secondary | ICD-10-CM | POA: Diagnosis not present

## 2022-12-30 DIAGNOSIS — M6281 Muscle weakness (generalized): Secondary | ICD-10-CM | POA: Diagnosis not present

## 2022-12-30 DIAGNOSIS — R279 Unspecified lack of coordination: Secondary | ICD-10-CM | POA: Diagnosis not present

## 2022-12-31 DIAGNOSIS — R279 Unspecified lack of coordination: Secondary | ICD-10-CM | POA: Diagnosis not present

## 2022-12-31 DIAGNOSIS — M6281 Muscle weakness (generalized): Secondary | ICD-10-CM | POA: Diagnosis not present

## 2023-01-01 DIAGNOSIS — M6281 Muscle weakness (generalized): Secondary | ICD-10-CM | POA: Diagnosis not present

## 2023-01-01 DIAGNOSIS — R279 Unspecified lack of coordination: Secondary | ICD-10-CM | POA: Diagnosis not present

## 2023-01-06 DIAGNOSIS — M6281 Muscle weakness (generalized): Secondary | ICD-10-CM | POA: Diagnosis not present

## 2023-01-06 DIAGNOSIS — R279 Unspecified lack of coordination: Secondary | ICD-10-CM | POA: Diagnosis not present

## 2023-01-07 DIAGNOSIS — H16143 Punctate keratitis, bilateral: Secondary | ICD-10-CM | POA: Diagnosis not present

## 2023-01-07 DIAGNOSIS — M6281 Muscle weakness (generalized): Secondary | ICD-10-CM | POA: Diagnosis not present

## 2023-01-07 DIAGNOSIS — R279 Unspecified lack of coordination: Secondary | ICD-10-CM | POA: Diagnosis not present

## 2023-01-08 ENCOUNTER — Non-Acute Institutional Stay (SKILLED_NURSING_FACILITY): Payer: Medicare Other | Admitting: Orthopedic Surgery

## 2023-01-08 ENCOUNTER — Encounter: Payer: Self-pay | Admitting: Orthopedic Surgery

## 2023-01-08 DIAGNOSIS — Z8673 Personal history of transient ischemic attack (TIA), and cerebral infarction without residual deficits: Secondary | ICD-10-CM

## 2023-01-08 DIAGNOSIS — R419 Unspecified symptoms and signs involving cognitive functions and awareness: Secondary | ICD-10-CM | POA: Diagnosis not present

## 2023-01-08 DIAGNOSIS — R2681 Unsteadiness on feet: Secondary | ICD-10-CM | POA: Diagnosis not present

## 2023-01-08 DIAGNOSIS — E1149 Type 2 diabetes mellitus with other diabetic neurological complication: Secondary | ICD-10-CM

## 2023-01-08 DIAGNOSIS — I10 Essential (primary) hypertension: Secondary | ICD-10-CM

## 2023-01-08 DIAGNOSIS — R634 Abnormal weight loss: Secondary | ICD-10-CM | POA: Diagnosis not present

## 2023-01-08 DIAGNOSIS — E1169 Type 2 diabetes mellitus with other specified complication: Secondary | ICD-10-CM

## 2023-01-08 DIAGNOSIS — R1312 Dysphagia, oropharyngeal phase: Secondary | ICD-10-CM

## 2023-01-08 DIAGNOSIS — E785 Hyperlipidemia, unspecified: Secondary | ICD-10-CM

## 2023-01-08 NOTE — Progress Notes (Signed)
Location:  [place] Room Number: 4/A Place of Service:  SNF [phone]) Provider:  Yvonna Alanis, NP   [name], MD  Patient Care Team: [name], MD as PCP - General (Internal Medicine) [name], DO as Consulting Physician (Neurology)  Extended Emergency Contact Information Primary Emergency Contact: [name],[name] Address: [address]          [place] [address] [place] Phone: [phone] Relation: Sister Secondary Emergency Contact: [facility] Address: [address] [place], [place] [address] [name] of [place] Mobile Phone: [phone] Relation: Brother  Code Status:  DNR Goals of care: Advanced Directive information    12/17/2022    1:51 PM  Advanced Directives  Does Patient Have a Medical Advance Directive? Yes  Type of Paramedic of [place];Living will;Out of facility DNR (pink MOST or yellow form)  Does patient want to make changes to medical advance directive? No - Patient declined  Copy of [place] in Chart? Yes - validated most recent copy scanned in chart (See row information)  Pre-existing out of facility DNR order (yellow form or pink MOST form) Pink MOST form placed in chart (order not valid for inpatient use)     Chief Complaint  Patient presents with   Medical Management of Chronic Issues    HPI:  Pt is a 82 y.o. female seen today for medical management of chronic diseases.    She currently resides on the skilled nursing unit at [facility]. Past medical history includes: HTN, T2DM, constipation, neurocognitive disorder, osteoporosis, dyslipidemia, hx of lung nodule, lung nodule, and abnormal gait.    T2DM-  A1c 6.4 (12/05)> was 6.1 (08/15)> was  7.1 (01/15/2022), no recent hypoglycemic events, blood sugars averaging 130-170's, remains on metformin, regular diet Neurocognitive disorder- unable to complete BIMS 11/10> BIMS score was  3/15 (07/26), dependent with ADLs, no behaviors, mainly nonverbal but was able to tell me her name today, not on medication Weight loss- see weights below, remains on BOOST milkshakes twice daily HTN- BUN/creat 18/0.8 10/2022, off medications at this time HLD- LDL 86 06/23/2022, remains on Lipitor Hx of TIA- remains on Plavix and statin Dysphagia- choking episodes 06/2022, no recent episodes, remains on regular foods with nectar thick liquids  Recent blood pressures:  02/27- 120/68  02/20- 125/68  02/13- 118/67  Recent weights:  02/01- 138 lbs  01/09- 138.7 lbs   12/06- 130.3 lbs      Past Medical History:  Diagnosis Date   Diabetes mellitus    Dyslipidemia    Hypertension    Osteoporosis    Urinary incontinence    No past surgical history on file.  Allergies  Allergen Reactions   Augmentin [Amoxicillin-Pot Clavulanate] Hives   Evista [Raloxifene]    Meloxicam     Unknown reaction   Scallops [Shellfish Allergy] Other (See Comments)    "I don't feel so good after an hour"   Toviaz [Fesoterodine Fumarate Er]     Unknown reaction    Outpatient Encounter Medications as of 01/08/2023  Medication Sig   acetaminophen (TYLENOL) 325 MG tablet Take 650 mg by mouth 2 (two) times daily.   atorvastatin (LIPITOR) 40 MG tablet Take 1 tablet (40 mg total) by mouth daily.   Calcium Carbonate-Vitamin D (CALTRATE 600+D PO) Take 1 tablet by mouth 2 (two) times daily.   cholecalciferol (VITAMIN D3) 25 MCG (  1000 UNIT) tablet Take 1,000 Units by mouth daily.   clopidogrel (PLAVIX) 75 MG tablet Take 75 mg by mouth daily.   metFORMIN (GLUCOPHAGE) 500 MG tablet Take 500 mg by mouth daily with breakfast.   metFORMIN (GLUCOPHAGE) 500 MG tablet Take 0.5 tablets (250 mg total) by mouth daily with supper.   sennosides-docusate sodium (SENOKOT-S) 8.6-50 MG tablet Take 1 tablet by mouth in the morning and at bedtime. Hold for loose stools   zinc oxide 20 % ointment Apply 1 application topically as  needed for irritation.   No facility-administered encounter medications on file as of 01/08/2023.    Review of Systems  Unable to perform ROS: Patient nonverbal    Immunization History  Administered Date(s) Administered   Fluad Quad(high Dose 65+) 09/02/2022   Influenza Inj Mdck Quad Pf 08/10/2019   Influenza-Unspecified 08/13/2020, 09/02/2021   Moderna Covid-19 Vaccine Bivalent Booster 87yr & up 04/22/2022   Moderna SARS-COV2 Booster Vaccination 04/16/2021   Moderna Sars-Covid-2 Vaccination 12/11/2019, 01/08/2020, 04/22/2022   PNEUMOCOCCAL CONJUGATE-20 01/08/2022   Tdap 01/07/2022   Unspecified SARS-COV-2 Vaccination 09/23/2020, 07/30/2021, 09/15/2022   Zoster Recombinat (Shingrix) 07/01/2020   Zoster, Live 07/01/2020   Pertinent  Health Maintenance Due  Topic Date Due   HEMOGLOBIN A1C  04/14/2023   FOOT EXAM  10/10/2023   OPHTHALMOLOGY EXAM  11/11/2023   INFLUENZA VACCINE  Completed   DEXA SCAN  Completed      07/10/2020   10:51 AM 06/25/2021    4:10 PM 07/01/2022    2:03 PM 10/09/2022   10:05 AM 01/08/2023    1:15 PM  Fall Risk  Falls in the past year?  1 1 0 1  Was there an injury with Fall?  0 1 0 0  Fall Risk Category Calculator  1 3 0 1  Fall Risk Category (Retired)  Low High Low   (RETIRED) Patient Fall Risk Level Moderate fall risk Moderate fall risk High fall risk High fall risk   Patient at Risk for Falls Due to  History of fall(s);Impaired balance/gait;Impaired mobility History of fall(s);Impaired balance/gait;Impaired mobility History of fall(s);Impaired balance/gait;Impaired mobility History of fall(s);Impaired balance/gait;Impaired mobility  Fall risk Follow up  Falls evaluation completed;Education provided;Falls prevention discussed Falls evaluation completed;Education provided;Falls prevention discussed Falls evaluation completed Falls evaluation completed;Education provided;Falls prevention discussed   Functional Status Survey:    Vitals:   01/08/23 1314   BP: 120/68  Pulse: 80  Resp: (!) 21  Temp: (!) 97.2 F (36.2 C)  SpO2: 98%  Weight: 138 lb (62.6 kg)  Height: '5\' 3"'$  (1.6 m)   Body mass index is 24.45 kg/m. Physical Exam Vitals reviewed.  Constitutional:      General: She is not in acute distress. HENT:     Head: Normocephalic.     Right Ear: There is no impacted cerumen.     Left Ear: There is no impacted cerumen.     Nose: Nose normal.     Mouth/Throat:     Mouth: Mucous membranes are moist.  Eyes:     General:        Right eye: No discharge.        Left eye: No discharge.  Cardiovascular:     Rate and Rhythm: Normal rate and regular rhythm.     Pulses: Normal pulses.     Heart sounds: Normal heart sounds.  Pulmonary:     Effort: Pulmonary effort is normal. No respiratory distress.     Breath sounds: Normal breath sounds.  No wheezing.  Abdominal:     General: Bowel sounds are normal. There is no distension.     Palpations: Abdomen is soft.     Tenderness: There is no abdominal tenderness.  Musculoskeletal:     Cervical back: Neck supple.     Right lower leg: No edema.     Left lower leg: No edema.  Skin:    General: Skin is warm and dry.     Capillary Refill: Capillary refill takes less than 2 seconds.  Neurological:     General: No focal deficit present.     Mental Status: She is alert. Mental status is at baseline.     Motor: Weakness present.     Gait: Gait abnormal.     Comments: wheelchair  Psychiatric:        Mood and Affect: Mood normal.     Comments: Alert to self/familiar face, does not follow most commands, able to say name     Labs reviewed: Recent Labs    02/09/22 0000 03/18/22 0000 08/13/22 0000 10/13/22 0000  NA 136* 138 143 139  K 3.9 4.2  --  4.0  CL 100 101 105 104  CO2 27* 25* 29* 25*  BUN 16 21 26* 18  CREATININE 0.8 0.8 0.8 0.8  CALCIUM 9.1 10.6 9.6 9.4   Recent Labs    02/09/22 0000 08/13/22 0000 10/13/22 0000  AST '20 14 13  '$ ALT '18 10 11  '$ ALKPHOS 49 45 47   ALBUMIN 4.0 3.9 3.5   Recent Labs    02/09/22 0000 03/18/22 0000 08/13/22 0000 10/13/22 0000  WBC 6.5 6.3 5.4 5.2  NEUTROABS 5,434.00 3,944.00 2,857.00  --   HGB 12.8 14.2 13.5 11.9*  HCT 38 42 41 35*  PLT 204 343  --  261   Lab Results  Component Value Date   TSH 1.14 10/16/2021   Lab Results  Component Value Date   HGBA1C 6.4 10/13/2022   Lab Results  Component Value Date   CHOL 147 06/23/2022   HDL 36 06/23/2022   LDLCALC 86 06/23/2022   TRIG 150 06/23/2022   CHOLHDL 4.5 08/20/2019    Significant Diagnostic Results in last 30 days:  No results found.  Assessment/Plan 1. Type 2 diabetes mellitus with other neurologic complication, without long-term current use of insulin (HCC) - A1c 6.4 (12/07) - no hypoglycemias, sugars 110-170's - urine microalbumin ordered 12/07> never completed> incontinence/hard to obtain sample - diabetic eye exam 01/07/2023 - cont metformin  2. Neurocognitive disorder - no behaviors - dependent with ADLs except feeding - mainly nonverbal - not on medications  3. Unstable gait - no falls within past month - cont skilled nursing  4. Weight loss - BMI 24.45 - weights stable - cont Boost  5. Primary hypertension - controlled without medication  6. Hyperlipidemia associated with type 2 diabetes mellitus (HCC) - LDL stable - cont atorvastatin  7. History of TIA (transient ischemic attack) - cont Plavix and atorvastatin  8. Oropharyngeal dysphagia - no recent aspirations - cont nectar thick fluids    Family/ staff Communication: plan discussed with patient and nurse  Labs/tests ordered:  none

## 2023-01-11 DIAGNOSIS — M6281 Muscle weakness (generalized): Secondary | ICD-10-CM | POA: Diagnosis not present

## 2023-01-11 DIAGNOSIS — R279 Unspecified lack of coordination: Secondary | ICD-10-CM | POA: Diagnosis not present

## 2023-01-12 ENCOUNTER — Encounter: Payer: Self-pay | Admitting: Adult Health

## 2023-01-12 ENCOUNTER — Non-Acute Institutional Stay (SKILLED_NURSING_FACILITY): Payer: Medicare Other | Admitting: Adult Health

## 2023-01-12 DIAGNOSIS — R279 Unspecified lack of coordination: Secondary | ICD-10-CM | POA: Diagnosis not present

## 2023-01-12 DIAGNOSIS — Z8673 Personal history of transient ischemic attack (TIA), and cerebral infarction without residual deficits: Secondary | ICD-10-CM | POA: Diagnosis not present

## 2023-01-12 DIAGNOSIS — R1312 Dysphagia, oropharyngeal phase: Secondary | ICD-10-CM | POA: Diagnosis not present

## 2023-01-12 DIAGNOSIS — M6281 Muscle weakness (generalized): Secondary | ICD-10-CM | POA: Diagnosis not present

## 2023-01-12 DIAGNOSIS — E1149 Type 2 diabetes mellitus with other diabetic neurological complication: Secondary | ICD-10-CM

## 2023-01-12 NOTE — Progress Notes (Signed)
Location:  [facility] Room Number: 4A Place of Service:  SNF (31) Provider:  Durenda Age, DNP, FNP-BC  Patient Care Team: Jaclyn Dad, MD as PCP - General (Internal Medicine) Jaclyn Partridge, DO as Consulting Physician (Neurology)  Extended Emergency Contact Information Primary Emergency Contact: Guzman,Jaclyn Address: [address]          [place] [address] [name] Phone: [phone] Relation: Sister Secondary Emergency Contact: [facility] Address: [address] [place], [place] [address] Jaclyn Guzman of [place] Mobile Phone: [phone] Relation: Brother  Code Status:   DNR  Goals of care: Advanced Directive information    12/17/2022    1:51 PM  Advanced Directives  Does Patient Have a Medical Advance Directive? Yes  Type of Paramedic of [place];Living will;Out of facility DNR (pink MOST or yellow form)  Does patient want to make changes to medical advance directive? No - Patient declined  Copy of [name] in Chart? Yes - validated most recent copy scanned in chart (See row information)  Pre-existing out of facility DNR order (yellow form or pink MOST form) Pink MOST form placed in chart (order not valid for inpatient use)     Chief Complaint  Patient presents with   Acute Visit    Family requesting speech therapy evaluation    HPI:  Pt is a 82 y.o. female seen today for an acute visit regarding family request for speech therapy evaluation. She has a PMH of hypertension, type 2 diabetes mellitus, constipation, neurocognitive disorder, osteoporosis, dyslipidemia, history of lung nodule and fragile x syndrome. She is currently on nectar thick liquid. Care plan meeting was held today with family and requested for speech therapy evaluation.  No reported choking episodes. She was seen in her room today. She is nonverbal.  She takes Plavix for TIA  She takes  Metformin for diabetes mellitus with last A1c 6.4   Past Medical History:  Diagnosis Date   Diabetes mellitus    Dyslipidemia    Hypertension    Osteoporosis    Urinary incontinence    No past surgical history on file.  Allergies  Allergen Reactions   Augmentin [Amoxicillin-Pot Clavulanate] Hives   Evista [Raloxifene]    Meloxicam     Unknown reaction   Scallops [Shellfish Allergy] Other (See Comments)    "I don't feel so good after an hour"   Toviaz [Fesoterodine Fumarate Er]     Unknown reaction    Outpatient Encounter Medications as of 01/12/2023  Medication Sig   acetaminophen (TYLENOL) 325 MG tablet Take 650 mg by mouth 2 (two) times daily.   atorvastatin (LIPITOR) 40 MG tablet Take 1 tablet (40 mg total) by mouth daily.   Calcium Carbonate-Vitamin D (CALTRATE 600+D PO) Take 1 tablet by mouth 2 (two) times daily.   cholecalciferol (VITAMIN D3) 25 MCG (1000 UNIT) tablet Take 1,000 Units by mouth daily.   clopidogrel (PLAVIX) 75 MG tablet Take 75 mg by mouth daily.   metFORMIN (GLUCOPHAGE) 500 MG tablet Take 500 mg by mouth daily with breakfast.   metFORMIN (GLUCOPHAGE) 500 MG tablet Take 0.5 tablets (250 mg total) by mouth daily with supper.   sennosides-docusate sodium (SENOKOT-S) 8.6-50 MG tablet Take 1 tablet by mouth in the morning and at bedtime. Hold for loose stools   zinc oxide 20 % ointment Apply 1 application topically as needed for irritation.  No facility-administered encounter medications on file as of 01/12/2023.    Review of Systems  Unable to obtain due to nonverbal    Immunization History  Administered Date(s) Administered   Fluad Quad(high Dose 65+) 09/02/2022   Influenza Inj Mdck Quad Pf 08/10/2019   Influenza-Unspecified 08/13/2020, 09/02/2021   Moderna Covid-19 Vaccine Bivalent Booster 33yr & up 04/22/2022   Moderna SARS-COV2 Booster Vaccination 04/16/2021   Moderna Sars-Covid-2 Vaccination 12/11/2019, 01/08/2020, 04/22/2022   PNEUMOCOCCAL  CONJUGATE-20 01/08/2022   Tdap 01/07/2022   Unspecified SARS-COV-2 Vaccination 09/23/2020, 07/30/2021, 09/15/2022   Zoster Recombinat (Shingrix) 07/01/2020   Zoster, Live 07/01/2020   Pertinent  Health Maintenance Due  Topic Date Due   HEMOGLOBIN A1C  04/14/2023   FOOT EXAM  10/10/2023   OPHTHALMOLOGY EXAM  11/11/2023   INFLUENZA VACCINE  Completed   DEXA SCAN  Completed      07/10/2020   10:51 AM 06/25/2021    4:10 PM 07/01/2022    2:03 PM 10/09/2022   10:05 AM 01/08/2023    1:15 PM  Fall Risk  Falls in the past year?  1 1 0 1  Was there an injury with Fall?  0 1 0 0  Fall Risk Category Calculator  1 3 0 1  Fall Risk Category (Retired)  Low High Low   (RETIRED) Patient Fall Risk Level Moderate fall risk Moderate fall risk High fall risk High fall risk   Patient at Risk for Falls Due to  History of fall(s);Impaired balance/gait;Impaired mobility History of fall(s);Impaired balance/gait;Impaired mobility History of fall(s);Impaired balance/gait;Impaired mobility History of fall(s);Impaired balance/gait;Impaired mobility  Fall risk Follow up  Falls evaluation completed;Education provided;Falls prevention discussed Falls evaluation completed;Education provided;Falls prevention discussed Falls evaluation completed Falls evaluation completed;Education provided;Falls prevention discussed     Vitals:   01/12/23 1413  BP: 120/68  Pulse: 80  Resp: (!) 21  Temp: (!) 97.2 F (36.2 C)  Weight: 138 lb (62.6 kg)  Height: '5\' 3"'$  (1.6 m)   Body mass index is 24.45 kg/m.  Physical Exam Constitutional:      General: She is not in acute distress. HENT:     Head: Normocephalic and atraumatic.     Nose: Nose normal.     Mouth/Throat:     Mouth: Mucous membranes are moist.  Eyes:     Conjunctiva/sclera: Conjunctivae normal.  Cardiovascular:     Rate and Rhythm: Normal rate and regular rhythm.  Pulmonary:     Effort: Pulmonary effort is normal.     Breath sounds: Normal breath sounds.   Abdominal:     General: Bowel sounds are normal.     Palpations: Abdomen is soft.  Musculoskeletal:     Cervical back: Normal range of motion.     Comments: Able to move bilateral hands and feet, uses walker  Skin:    General: Skin is warm and dry.  Neurological:     Mental Status: Mental status is at baseline.     Comments: Nonverbal  Psychiatric:        Mood and Affect: Mood normal.        Behavior: Behavior normal.       Labs reviewed: Recent Labs    02/09/22 0000 03/18/22 0000 08/13/22 0000 10/13/22 0000  NA 136* 138 143 139  K 3.9 4.2  --  4.0  CL 100 101 105 104  CO2 27* 25* 29* 25*  BUN 16 21 26* 18  CREATININE 0.8 0.8 0.8 0.8  CALCIUM 9.1 10.6 9.6  9.4   Recent Labs    02/09/22 0000 08/13/22 0000 10/13/22 0000  AST '20 14 13  '$ ALT '18 10 11  '$ ALKPHOS 49 45 47  ALBUMIN 4.0 3.9 3.5   Recent Labs    02/09/22 0000 03/18/22 0000 08/13/22 0000 10/13/22 0000  WBC 6.5 6.3 5.4 5.2  NEUTROABS 5,434.00 3,944.00 2,857.00  --   HGB 12.8 14.2 13.5 11.9*  HCT 38 42 41 35*  PLT 204 343  --  261   Lab Results  Component Value Date   TSH 1.14 10/16/2021   Lab Results  Component Value Date   HGBA1C 6.4 10/13/2022   Lab Results  Component Value Date   CHOL 147 06/23/2022   HDL 36 06/23/2022   LDLCALC 86 06/23/2022   TRIG 150 06/23/2022   CHOLHDL 4.5 08/20/2019    Significant Diagnostic Results in last 30 days:  No results found.  Assessment/Plan  1. Oropharyngeal dysphagia -  continue nectar thick liquid -   aspiration precautions -  speech therapy evaluation and treatment  2. History of TIA (transient ischemic attack) -  stable -  continue Plavix and Atorvastatin  3. Type 2 diabetes mellitus with other neurologic complication, without long-term current use of insulin (HCC) Lab Results  Component Value Date   HGBA1C 6.4 10/13/2022   -  stable -  continue Metformin     Family/ staff Communication: Discussed plan of care with charge  nurse.  Labs/tests ordered:  None    Durenda Age, DNP, MSN, FNP-BC [facility] [phone] (Monday-Friday 8:00 a.m. - 5:00 p.m.) [phone] (after hours)

## 2023-01-13 DIAGNOSIS — R1312 Dysphagia, oropharyngeal phase: Secondary | ICD-10-CM | POA: Diagnosis not present

## 2023-01-14 DIAGNOSIS — R1312 Dysphagia, oropharyngeal phase: Secondary | ICD-10-CM | POA: Diagnosis not present

## 2023-01-14 DIAGNOSIS — M6281 Muscle weakness (generalized): Secondary | ICD-10-CM | POA: Diagnosis not present

## 2023-01-14 DIAGNOSIS — R279 Unspecified lack of coordination: Secondary | ICD-10-CM | POA: Diagnosis not present

## 2023-01-15 DIAGNOSIS — R1312 Dysphagia, oropharyngeal phase: Secondary | ICD-10-CM | POA: Diagnosis not present

## 2023-01-18 DIAGNOSIS — R279 Unspecified lack of coordination: Secondary | ICD-10-CM | POA: Diagnosis not present

## 2023-01-18 DIAGNOSIS — M6281 Muscle weakness (generalized): Secondary | ICD-10-CM | POA: Diagnosis not present

## 2023-01-18 DIAGNOSIS — R1312 Dysphagia, oropharyngeal phase: Secondary | ICD-10-CM | POA: Diagnosis not present

## 2023-01-19 DIAGNOSIS — R279 Unspecified lack of coordination: Secondary | ICD-10-CM | POA: Diagnosis not present

## 2023-01-19 DIAGNOSIS — M6281 Muscle weakness (generalized): Secondary | ICD-10-CM | POA: Diagnosis not present

## 2023-01-19 DIAGNOSIS — R1312 Dysphagia, oropharyngeal phase: Secondary | ICD-10-CM | POA: Diagnosis not present

## 2023-01-20 DIAGNOSIS — R279 Unspecified lack of coordination: Secondary | ICD-10-CM | POA: Diagnosis not present

## 2023-01-20 DIAGNOSIS — M6281 Muscle weakness (generalized): Secondary | ICD-10-CM | POA: Diagnosis not present

## 2023-01-21 DIAGNOSIS — R1312 Dysphagia, oropharyngeal phase: Secondary | ICD-10-CM | POA: Diagnosis not present

## 2023-01-22 ENCOUNTER — Non-Acute Institutional Stay (SKILLED_NURSING_FACILITY): Payer: Medicare Other | Admitting: Orthopedic Surgery

## 2023-01-22 ENCOUNTER — Encounter: Payer: Self-pay | Admitting: Orthopedic Surgery

## 2023-01-22 DIAGNOSIS — R1312 Dysphagia, oropharyngeal phase: Secondary | ICD-10-CM | POA: Diagnosis not present

## 2023-01-22 DIAGNOSIS — E1149 Type 2 diabetes mellitus with other diabetic neurological complication: Secondary | ICD-10-CM | POA: Diagnosis not present

## 2023-01-22 DIAGNOSIS — R400 Somnolence: Secondary | ICD-10-CM | POA: Diagnosis not present

## 2023-01-22 DIAGNOSIS — R419 Unspecified symptoms and signs involving cognitive functions and awareness: Secondary | ICD-10-CM

## 2023-01-22 DIAGNOSIS — D649 Anemia, unspecified: Secondary | ICD-10-CM | POA: Diagnosis not present

## 2023-01-22 DIAGNOSIS — I10 Essential (primary) hypertension: Secondary | ICD-10-CM | POA: Diagnosis not present

## 2023-01-22 LAB — BASIC METABOLIC PANEL
BUN: 27 — AB (ref 4–21)
CO2: 26 — AB (ref 13–22)
Chloride: 104 (ref 99–108)
Creatinine: 0.9 (ref 0.5–1.1)
Glucose: 283
Potassium: 4.1 mEq/L (ref 3.5–5.1)
Sodium: 139 (ref 137–147)

## 2023-01-22 LAB — CBC AND DIFFERENTIAL
HCT: 37 (ref 36–46)
Hemoglobin: 12.8 (ref 12.0–16.0)
Neutrophils Absolute: 8217
Platelets: 258 10*3/uL (ref 150–400)
WBC: 11

## 2023-01-22 LAB — CBC: RBC: 3.99 (ref 3.87–5.11)

## 2023-01-22 LAB — COMPREHENSIVE METABOLIC PANEL: Calcium: 9.6 (ref 8.7–10.7)

## 2023-01-22 NOTE — Progress Notes (Signed)
Location:  [facility] Room Number: 4/A Place of Service:  SNF [phone]) Provider:  Yvonna Alanis, NP   [name], MD  Patient Care Team: [name], MD as PCP - General (Internal Medicine) [name], DO as Consulting Physician (Neurology)  Extended Emergency Contact Information Primary Emergency Contact: [name],[name] Address: [address]          [place] [address] [place] Phone: [phone] Relation: Sister Secondary Emergency Contact: [facility] Address: [address] [place], [place] [address] [name] of [place] Mobile Phone: [phone] Relation: Brother  Code Status:  DNR Goals of care: Advanced Directive information    01/12/2023    3:13 PM  Advanced Directives  Does Patient Have a Medical Advance Directive? Yes  Type of Paramedic of [place];Out of facility DNR (pink MOST or yellow form);Living will  Does patient want to make changes to medical advance directive? No - Patient declined  Copy of [place] in Chart? Yes - validated most recent copy scanned in chart (See row information)     Chief Complaint  Patient presents with   Acute Visit    Somnolence     HPI:  Pt is a 82 y.o. female seen today for acute visit due to somnolence.   She currently resides on the skilled nursing unit at [facility]. Past medical history includes: HTN, T2DM, constipation, neurocognitive disorder, osteoporosis, dyslipidemia, hx of lung nodule, lung nodule, and abnormal gait.    Nursing reports increased somnolence this morning. She is nonverbal and unable to follow commands during our encounter. Afebrile. Vitals stable. Blood sugar 315. She did not eat breakfast this morning. No recent falls or injuries.    Past Medical History:  Diagnosis Date   Diabetes mellitus    Dyslipidemia    Hypertension    Osteoporosis    Urinary incontinence    No past  surgical history on file.  Allergies  Allergen Reactions   Augmentin [Amoxicillin-Pot Clavulanate] Hives   Evista [Raloxifene]    Meloxicam     Unknown reaction   Scallops [Shellfish Allergy] Other (See Comments)    "I don't feel so good after an hour"   Toviaz [Fesoterodine Fumarate Er]     Unknown reaction    Outpatient Encounter Medications as of 01/22/2023  Medication Sig   acetaminophen (TYLENOL) 325 MG tablet Take 650 mg by mouth 2 (two) times daily.   atorvastatin (LIPITOR) 40 MG tablet Take 1 tablet (40 mg total) by mouth daily.   Calcium Carbonate-Vitamin D (CALTRATE 600+D PO) Take 1 tablet by mouth 2 (two) times daily.   cholecalciferol (VITAMIN D3) 25 MCG (1000 UNIT) tablet Take 1,000 Units by mouth daily.   clopidogrel (PLAVIX) 75 MG tablet Take 75 mg by mouth daily.   metFORMIN (GLUCOPHAGE) 500 MG tablet Take 500 mg by mouth daily with breakfast.   metFORMIN (GLUCOPHAGE) 500 MG tablet Take 0.5 tablets (250 mg total) by mouth daily with supper.   sennosides-docusate sodium (SENOKOT-S) 8.6-50 MG tablet Take 1 tablet by mouth in the morning and at bedtime. Hold for loose stools   zinc oxide 20 % ointment Apply 1 application topically as needed for irritation.   No facility-administered encounter medications on file as of 01/22/2023.    Review of Systems  Unable to perform ROS: Patient nonverbal    Immunization History  Administered Date(s) Administered  Fluad Quad(high Dose 65+) 09/02/2022   Influenza Inj Mdck Quad Pf 08/10/2019   Influenza-Unspecified 08/13/2020, 09/02/2021   Moderna Covid-19 Vaccine Bivalent Booster 59yrs & up 04/22/2022   Moderna SARS-COV2 Booster Vaccination 04/16/2021   Moderna Sars-Covid-2 Vaccination 12/11/2019, 01/08/2020, 04/22/2022   PNEUMOCOCCAL CONJUGATE-20 01/08/2022   Tdap 01/07/2022   Unspecified SARS-COV-2 Vaccination 09/23/2020, 07/30/2021, 09/15/2022   Zoster Recombinat (Shingrix) 07/01/2020   Zoster, Live 07/01/2020    Pertinent  Health Maintenance Due  Topic Date Due   HEMOGLOBIN A1C  04/14/2023   FOOT EXAM  10/10/2023   OPHTHALMOLOGY EXAM  01/07/2024   INFLUENZA VACCINE  Completed   DEXA SCAN  Completed      07/10/2020   10:51 AM 06/25/2021    4:10 PM 07/01/2022    2:03 PM 10/09/2022   10:05 AM 01/08/2023    1:15 PM  Fall Risk  Falls in the past year?  1 1 0 1  Was there an injury with Fall?  0 1 0 0  Fall Risk Category Calculator  1 3 0 1  Fall Risk Category (Retired)  Low High Low   (RETIRED) Patient Fall Risk Level Moderate fall risk Moderate fall risk High fall risk High fall risk   Patient at Risk for Falls Due to  History of fall(s);Impaired balance/gait;Impaired mobility History of fall(s);Impaired balance/gait;Impaired mobility History of fall(s);Impaired balance/gait;Impaired mobility History of fall(s);Impaired balance/gait;Impaired mobility  Fall risk Follow up  Falls evaluation completed;Education provided;Falls prevention discussed Falls evaluation completed;Education provided;Falls prevention discussed Falls evaluation completed Falls evaluation completed;Education provided;Falls prevention discussed   Functional Status Survey:    Vitals:   01/22/23 1622  BP: 122/63  Pulse: 87  Resp: 18  Temp: (!) 97 F (36.1 C)  SpO2: 97%  Weight: 134 lb 8 oz (61 kg)  Height: 5\' 3"  (1.6 m)   Body mass index is 23.83 kg/m. Physical Exam Vitals reviewed.  Constitutional:      General: She is not in acute distress.    Comments: Pale complexion  HENT:     Head: Normocephalic.     Right Ear: There is no impacted cerumen.     Left Ear: There is no impacted cerumen.     Nose: Nose normal.     Mouth/Throat:     Mouth: Mucous membranes are moist.  Eyes:     General:        Right eye: No discharge.        Left eye: No discharge.  Cardiovascular:     Rate and Rhythm: Normal rate and regular rhythm.     Pulses: Normal pulses.     Heart sounds: Normal heart sounds.  Pulmonary:      Effort: Pulmonary effort is normal. No respiratory distress.     Breath sounds: Examination of the right-middle field reveals decreased breath sounds. Examination of the left-middle field reveals decreased breath sounds. Decreased breath sounds present. No wheezing.  Abdominal:     General: Bowel sounds are normal. There is no distension.     Palpations: Abdomen is soft.     Tenderness: There is no abdominal tenderness.  Musculoskeletal:     Cervical back: Neck supple.     Right lower leg: No edema.     Left lower leg: No edema.  Skin:    General: Skin is warm.     Capillary Refill: Capillary refill takes less than 2 seconds.     Comments: Warm to touch, forehead moist  Neurological:  General: No focal deficit present.     Mental Status: She is easily aroused. Mental status is at baseline.     Motor: Weakness present.     Gait: Gait abnormal.     Comments: wheelchair  Psychiatric:        Mood and Affect: Mood normal.     Comments: Nonverbal, does not follow commands     Labs reviewed: Recent Labs    02/09/22 0000 03/18/22 0000 08/13/22 0000 10/13/22 0000  NA 136* 138 143 139  K 3.9 4.2  --  4.0  CL 100 101 105 104  CO2 27* 25* 29* 25*  BUN 16 21 26* 18  CREATININE 0.8 0.8 0.8 0.8  CALCIUM 9.1 10.6 9.6 9.4   Recent Labs    02/09/22 0000 08/13/22 0000 10/13/22 0000  AST 20 14 13   ALT 18 10 11   ALKPHOS 49 45 47  ALBUMIN 4.0 3.9 3.5   Recent Labs    02/09/22 0000 03/18/22 0000 08/13/22 0000 10/13/22 0000  WBC 6.5 6.3 5.4 5.2  NEUTROABS 5,434.00 3,944.00 2,857.00  --   HGB 12.8 14.2 13.5 11.9*  HCT 38 42 41 35*  PLT 204 343  --  261   Lab Results  Component Value Date   TSH 1.14 10/16/2021   Lab Results  Component Value Date   HGBA1C 6.4 10/13/2022   Lab Results  Component Value Date   CHOL 147 06/23/2022   HDL 36 06/23/2022   LDLCALC 86 06/23/2022   TRIG 150 06/23/2022   CHOLHDL 4.5 08/20/2019    Significant Diagnostic Results in last 30  days:  No results found.  Assessment/Plan 1. Somnolence - easily awakens to voice, non verbal, does not follow commands  - warm to touch, diminished middle lobes, afebrile, vitals stable - not on controlled substances - give glucerna if she does not eat meals - cbc/diff - bmp - CXR - UA/culture  2. Type 2 diabetes mellitus with other neurologic complication, without long-term current use of insulin (HCC) - morning blood sugar 315> no breakfast/lunch - A1c 6.4 10/13/2022 - ? Infection - cont metformin - cont to check sugars AC - see above  3. Neurocognitive disorder - dependent with ADLs except feeding  - nonverbal - not on medication - cont skilled nursing  4. Oropharyngeal dysphagia - ST consult made last care meeting> family request - cont nectar thick fluids    Family/ staff Communication: plan discussed with patient, sister and nursing   Labs/tests ordered:  cbc/diff, bmp, CXR, UA/culture

## 2023-01-25 ENCOUNTER — Non-Acute Institutional Stay (SKILLED_NURSING_FACILITY): Payer: Medicare Other | Admitting: Orthopedic Surgery

## 2023-01-25 ENCOUNTER — Encounter: Payer: Self-pay | Admitting: Orthopedic Surgery

## 2023-01-25 DIAGNOSIS — R400 Somnolence: Secondary | ICD-10-CM

## 2023-01-25 DIAGNOSIS — E1149 Type 2 diabetes mellitus with other diabetic neurological complication: Secondary | ICD-10-CM | POA: Diagnosis not present

## 2023-01-25 DIAGNOSIS — R1312 Dysphagia, oropharyngeal phase: Secondary | ICD-10-CM | POA: Diagnosis not present

## 2023-01-25 DIAGNOSIS — R279 Unspecified lack of coordination: Secondary | ICD-10-CM | POA: Diagnosis not present

## 2023-01-25 DIAGNOSIS — M6281 Muscle weakness (generalized): Secondary | ICD-10-CM | POA: Diagnosis not present

## 2023-01-25 DIAGNOSIS — R419 Unspecified symptoms and signs involving cognitive functions and awareness: Secondary | ICD-10-CM | POA: Diagnosis not present

## 2023-01-25 DIAGNOSIS — R1110 Vomiting, unspecified: Secondary | ICD-10-CM

## 2023-01-25 NOTE — Progress Notes (Signed)
Location:  [facility] Room Number: 4/A Place of Service:  SNF [phone]) Provider:  Yvonna Alanis, NP   [name], MD  Patient Care Team: [name], MD as PCP - General (Internal Medicine) [name], DO as Consulting Physician (Neurology)  Extended Emergency Contact Information Primary Emergency Contact: [name],[name] Address: [address]          [place] [address] [place] Phone: [phone] Relation: Sister Secondary Emergency Contact: [facility] Address: [address] [place], [place] [address] [name] of [place] Mobile Phone: [phone] Relation: Brother  Code Status:  DNR Goals of care: Advanced Directive information    01/12/2023    3:13 PM  Advanced Directives  Does Patient Have a Medical Advance Directive? Yes  Type of Paramedic of [place];Out of facility DNR (pink MOST or yellow form);Living will  Does patient want to make changes to medical advance directive? No - Patient declined  Copy of [place] in Chart? Yes - validated most recent copy scanned in chart (See row information)     Chief Complaint  Patient presents with   Acute Visit    Somnolence, dyshagia     HPI:  Pt is a 82 y.o. female seen today for acute visit due to recent somnolence and dysphagia.   She currently resides on the skilled nursing unit at [facility]. Past medical history includes: HTN, T2DM, constipation, neurocognitive disorder, osteoporosis, dyslipidemia, hx of lung nodule, lung nodule, and abnormal gait.    03/15 increased somnolence. She was not eating or getting out of bed. CXR unremarkable. WBC 11.0, hgb 12.8, hct 37.1, neutrophils 8217, glucose 283, BUN/creat 27/0.87, sodium 139, K+ 4.1, CO2 26, calcium 9.6. UA/culture pending. Over the weekend she began to eat more and was more alert. 03/17 she was eating lunch and vomited meal. LBM 03/17 in evening. She is  a poor historian due to neurocognitive disorder. She was eating breakfast without difficulty today. No recent falls or injuries. Afebrile. Vitals stable.    Past Medical History:  Diagnosis Date   Diabetes mellitus    Dyslipidemia    Hypertension    Osteoporosis    Urinary incontinence    History reviewed. No pertinent surgical history.  Allergies  Allergen Reactions   Augmentin [Amoxicillin-Pot Clavulanate] Hives   Evista [Raloxifene]    Meloxicam     Unknown reaction   Scallops [Shellfish Allergy] Other (See Comments)    "I don't feel so good after an hour"   Toviaz [Fesoterodine Fumarate Er]     Unknown reaction    Outpatient Encounter Medications as of 01/25/2023  Medication Sig   acetaminophen (TYLENOL) 325 MG tablet Take 650 mg by mouth 2 (two) times daily.   atorvastatin (LIPITOR) 40 MG tablet Take 1 tablet (40 mg total) by mouth daily.   Calcium Carbonate-Vitamin D (CALTRATE 600+D PO) Take 1 tablet by mouth 2 (two) times daily.   cholecalciferol (VITAMIN D3) 25 MCG (1000 UNIT) tablet Take 1,000 Units by mouth daily.   clopidogrel (PLAVIX) 75 MG tablet Take 75 mg by mouth daily.   metFORMIN (GLUCOPHAGE) 500 MG tablet Take 500 mg by mouth daily with breakfast.   metFORMIN (GLUCOPHAGE) 500 MG tablet Take 0.5 tablets (250 mg total) by mouth daily with supper.   sennosides-docusate sodium (SENOKOT-S) 8.6-50 MG tablet Take 1 tablet by mouth in the morning and at  bedtime. Hold for loose stools   zinc oxide 20 % ointment Apply 1 application topically as needed for irritation.   No facility-administered encounter medications on file as of 01/25/2023.    Review of Systems  Unable to perform ROS: Patient nonverbal    Immunization History  Administered Date(s) Administered   Fluad Quad(high Dose 65+) 09/02/2022   Influenza Inj Mdck Quad Pf 08/10/2019   Influenza-Unspecified 08/13/2020, 09/02/2021   Moderna Covid-19 Vaccine Bivalent Booster 75yrs & up 04/22/2022   Moderna  SARS-COV2 Booster Vaccination 04/16/2021   Moderna Sars-Covid-2 Vaccination 12/11/2019, 01/08/2020, 04/22/2022   PNEUMOCOCCAL CONJUGATE-20 01/08/2022   Tdap 01/07/2022   Unspecified SARS-COV-2 Vaccination 09/23/2020, 07/30/2021, 09/15/2022   Zoster Recombinat (Shingrix) 07/01/2020   Zoster, Live 07/01/2020   Pertinent  Health Maintenance Due  Topic Date Due   HEMOGLOBIN A1C  04/14/2023   FOOT EXAM  10/10/2023   OPHTHALMOLOGY EXAM  01/07/2024   INFLUENZA VACCINE  Completed   DEXA SCAN  Completed      06/25/2021    4:10 PM 07/01/2022    2:03 PM 10/09/2022   10:05 AM 01/08/2023    1:15 PM 01/22/2023    4:41 PM  Fall Risk  Falls in the past year? 1 1 0 1 1  Was there an injury with Fall? 0 1 0 0 0  Fall Risk Category Calculator 1 3 0 1 1  Fall Risk Category (Retired) Low High Low    (RETIRED) Patient Fall Risk Level Moderate fall risk High fall risk High fall risk    Patient at Risk for Falls Due to History of fall(s);Impaired balance/gait;Impaired mobility History of fall(s);Impaired balance/gait;Impaired mobility History of fall(s);Impaired balance/gait;Impaired mobility History of fall(s);Impaired balance/gait;Impaired mobility History of fall(s);Impaired mobility;Impaired balance/gait  Fall risk Follow up Falls evaluation completed;Education provided;Falls prevention discussed Falls evaluation completed;Education provided;Falls prevention discussed Falls evaluation completed Falls evaluation completed;Education provided;Falls prevention discussed Falls evaluation completed;Education provided;Falls prevention discussed   Functional Status Survey:    Vitals:   01/25/23 1033  BP: 123/63  Pulse: 87  Resp: 18  Temp: (!) 97 F (36.1 C)  SpO2: 97%  Weight: 134 lb 8 oz (61 kg)   Body mass index is 23.83 kg/m. Physical Exam Vitals reviewed.  Constitutional:      General: She is not in acute distress. HENT:     Head: Normocephalic.  Eyes:     General:        Right eye: No  discharge.        Left eye: No discharge.  Cardiovascular:     Rate and Rhythm: Normal rate and regular rhythm.     Pulses: Normal pulses.     Heart sounds: Normal heart sounds.  Pulmonary:     Effort: Pulmonary effort is normal. No respiratory distress.     Breath sounds: Normal breath sounds. No wheezing or rales.  Abdominal:     General: Bowel sounds are normal. There is no distension.     Palpations: Abdomen is soft. There is no mass.     Tenderness: There is no abdominal tenderness. There is no right CVA tenderness, left CVA tenderness, guarding or rebound.     Hernia: No hernia is present.  Musculoskeletal:     Cervical back: Neck supple.     Right lower leg: No edema.     Left lower leg: No edema.  Skin:    General: Skin is warm and dry.     Capillary Refill: Capillary refill takes less than 2  seconds.  Neurological:     General: No focal deficit present.     Mental Status: She is alert. Mental status is at baseline.     Motor: Weakness present.     Gait: Gait abnormal.     Comments: hoyer  Psychiatric:        Mood and Affect: Mood normal.     Comments: Nonverbal, does not follow commands     Labs reviewed: Recent Labs    02/09/22 0000 03/18/22 0000 08/13/22 0000 10/13/22 0000  NA 136* 138 143 139  K 3.9 4.2  --  4.0  CL 100 101 105 104  CO2 27* 25* 29* 25*  BUN 16 21 26* 18  CREATININE 0.8 0.8 0.8 0.8  CALCIUM 9.1 10.6 9.6 9.4   Recent Labs    02/09/22 0000 08/13/22 0000 10/13/22 0000  AST 20 14 13   ALT 18 10 11   ALKPHOS 49 45 47  ALBUMIN 4.0 3.9 3.5   Recent Labs    02/09/22 0000 03/18/22 0000 08/13/22 0000 10/13/22 0000  WBC 6.5 6.3 5.4 5.2  NEUTROABS 5,434.00 3,944.00 2,857.00  --   HGB 12.8 14.2 13.5 11.9*  HCT 38 42 41 35*  PLT 204 343  --  261   Lab Results  Component Value Date   TSH 1.14 10/16/2021   Lab Results  Component Value Date   HGBA1C 6.4 10/13/2022   Lab Results  Component Value Date   CHOL 147 06/23/2022   HDL  36 06/23/2022   LDLCALC 86 06/23/2022   TRIG 150 06/23/2022   CHOLHDL 4.5 08/20/2019    Significant Diagnostic Results in last 30 days:  No results found.  Assessment/Plan 1. Somnolence - improved from 03/15> more alert, eating and sleeping less - CXR unremarkable - UA/culture pending - WBC elevated at 11, neutrophils 8217  2. Oropharyngeal dysphagia - ongoing - 03/17 vomited after eating lunch - eating without difficulty today  - cont nectar thick fluids  3. Type 2 diabetes mellitus with other neurologic complication, without long-term current use of insulin (HCC) - A1c 6.4> 03/15 CBG 315, glucose 284 - skipping meals at times - cont metformin - check CBG prior to metformin administration x 2 weeks, hold if < 100 - cont to promote Glucerna if meal skipped   4. Neurocognitive disorder - ? progression of disease> not getting out of bed, skipping meals - consider hospice consult if condition continues to decline  5. Vomiting, unspecified vomiting type, unspecified whether nausea present - 03/17 one episode after eating lunch> no other episodes - LBM 03/17 in evening - abdomen soft    Family/ staff Communication: plan discussed with patient and nurse  Labs/tests ordered:  UA/culture pending

## 2023-01-25 NOTE — Progress Notes (Signed)
Location:   [facility] Room Number: 4-A Place of Service:  SNF [phone]) Provider:  Windell Moulding, NP  PCP: [name], MD  Patient Care Team: [name], MD as PCP - General (Internal Medicine) [name], DO as Consulting Physician (Neurology)  Extended Emergency Contact Information Primary Emergency Contact: [name],[name] Address: [address]          [place] [address] [name] of [place] Phone: [phone] Relation: Sister Secondary Emergency Contact: [facility] Address: [address] [place], [place] [address] [name] of [place] Mobile Phone: [phone] Relation: Brother  Code Status:  DNR Goals of care: Advanced Directive information    01/12/2023    3:13 PM  Advanced Directives  Does Patient Have a Medical Advance Directive? Yes  Type of Paramedic of [place];Out of facility DNR (pink MOST or yellow form);Living will  Does patient want to make changes to medical advance directive? No - Patient declined  Copy of [place] in Chart? Yes - validated most recent copy scanned in chart (See row information)     Chief Complaint  Patient presents with   Acute Visit    Somnolence, dyshagia     HPI:  Pt is a 82 y.o. female seen today for an acute visit for    Past Medical History:  Diagnosis Date   Diabetes mellitus    Dyslipidemia    Hypertension    Osteoporosis    Urinary incontinence    History reviewed. No pertinent surgical history.  Allergies  Allergen Reactions   Augmentin [Amoxicillin-Pot Clavulanate] Hives   Evista [Raloxifene]    Meloxicam     Unknown reaction   Scallops [Shellfish Allergy] Other (See Comments)    "I don't feel so good after an hour"   Toviaz [Fesoterodine Fumarate Er]     Unknown reaction    Allergies as of 01/25/2023       Reactions   Augmentin [amoxicillin-pot Clavulanate] Hives   Evista [raloxifene]    Meloxicam     Unknown reaction   Scallops [shellfish Allergy] Other (See Comments)   "I don't feel so good after an hour"   Toviaz [fesoterodine Fumarate Er]    Unknown reaction        Medication List        Accurate as of January 25, 2023 10:55 AM. If you have any questions, ask your nurse or doctor.          acetaminophen 325 MG tablet Commonly known as: TYLENOL Take 650 mg by mouth 2 (two) times daily.   atorvastatin 40 MG tablet Commonly known as: LIPITOR Take 1 tablet (40 mg total) by mouth daily.   CALTRATE 600+D PO Take 1 tablet by mouth 2 (two) times daily.   cholecalciferol 25 MCG (1000 UNIT) tablet Commonly known as: VITAMIN D3 Take 1,000 Units by mouth daily.   clopidogrel 75 MG tablet Commonly known as: PLAVIX Take 75 mg by mouth daily.   metFORMIN 500 MG tablet Commonly known as: GLUCOPHAGE Take 500 mg by mouth daily with breakfast.   metFORMIN 500 MG tablet Commonly known as: GLUCOPHAGE Take 0.5 tablets (250 mg total) by mouth daily with supper.   sennosides-docusate sodium 8.6-50 MG tablet Commonly known as: SENOKOT-S Take 1 tablet by mouth in the morning and at bedtime. Hold for loose stools   zinc oxide 20 % ointment Apply 1 application topically  as needed for irritation.        Review of Systems  Immunization History  Administered Date(s) Administered   Fluad Quad(high Dose 65+) 09/02/2022   Influenza Inj Mdck Quad Pf 08/10/2019   Influenza-Unspecified 08/13/2020, 09/02/2021   Moderna Covid-19 Vaccine Bivalent Booster 84yrs & up 04/22/2022   Moderna SARS-COV2 Booster Vaccination 04/16/2021   Moderna Sars-Covid-2 Vaccination 12/11/2019, 01/08/2020, 04/22/2022   PNEUMOCOCCAL CONJUGATE-20 01/08/2022   Tdap 01/07/2022   Unspecified SARS-COV-2 Vaccination 09/23/2020, 07/30/2021, 09/15/2022   Zoster Recombinat (Shingrix) 07/01/2020   Zoster, Live 07/01/2020   Pertinent  Health Maintenance Due  Topic Date Due   HEMOGLOBIN A1C  04/14/2023   FOOT  EXAM  10/10/2023   OPHTHALMOLOGY EXAM  01/07/2024   INFLUENZA VACCINE  Completed   DEXA SCAN  Completed      06/25/2021    4:10 PM 07/01/2022    2:03 PM 10/09/2022   10:05 AM 01/08/2023    1:15 PM 01/22/2023    4:41 PM  Fall Risk  Falls in the past year? 1 1 0 1 1  Was there an injury with Fall? 0 1 0 0 0  Fall Risk Category Calculator 1 3 0 1 1  Fall Risk Category (Retired) Low High Low    (RETIRED) Patient Fall Risk Level Moderate fall risk High fall risk High fall risk    Patient at Risk for Falls Due to History of fall(s);Impaired balance/gait;Impaired mobility History of fall(s);Impaired balance/gait;Impaired mobility History of fall(s);Impaired balance/gait;Impaired mobility History of fall(s);Impaired balance/gait;Impaired mobility History of fall(s);Impaired mobility;Impaired balance/gait  Fall risk Follow up Falls evaluation completed;Education provided;Falls prevention discussed Falls evaluation completed;Education provided;Falls prevention discussed Falls evaluation completed Falls evaluation completed;Education provided;Falls prevention discussed Falls evaluation completed;Education provided;Falls prevention discussed   Functional Status Survey:    Vitals:   01/25/23 1033  BP: 123/63  Pulse: 87  Resp: 18  Temp: (!) 97 F (36.1 C)  SpO2: 97%  Weight: 134 lb 8 oz (61 kg)   Body mass index is 23.83 kg/m. Physical Exam  Labs reviewed: Recent Labs    02/09/22 0000 03/18/22 0000 08/13/22 0000 10/13/22 0000  NA 136* 138 143 139  K 3.9 4.2  --  4.0  CL 100 101 105 104  CO2 27* 25* 29* 25*  BUN 16 21 26* 18  CREATININE 0.8 0.8 0.8 0.8  CALCIUM 9.1 10.6 9.6 9.4   Recent Labs    02/09/22 0000 08/13/22 0000 10/13/22 0000  AST 20 14 13   ALT 18 10 11   ALKPHOS 49 45 47  ALBUMIN 4.0 3.9 3.5   Recent Labs    02/09/22 0000 03/18/22 0000 08/13/22 0000 10/13/22 0000  WBC 6.5 6.3 5.4 5.2  NEUTROABS 5,434.00 3,944.00 2,857.00  --   HGB 12.8 14.2 13.5 11.9*  HCT  38 42 41 35*  PLT 204 343  --  261   Lab Results  Component Value Date   TSH 1.14 10/16/2021   Lab Results  Component Value Date   HGBA1C 6.4 10/13/2022   Lab Results  Component Value Date   CHOL 147 06/23/2022   HDL 36 06/23/2022   LDLCALC 86 06/23/2022   TRIG 150 06/23/2022   CHOLHDL 4.5 08/20/2019    Significant Diagnostic Results in last 30 days:  No results found.  Assessment/Plan There are no diagnoses linked to this encounter.   Family/ staff Communication:   Labs/tests ordered:

## 2023-01-26 DIAGNOSIS — N390 Urinary tract infection, site not specified: Secondary | ICD-10-CM | POA: Diagnosis not present

## 2023-01-26 DIAGNOSIS — R1312 Dysphagia, oropharyngeal phase: Secondary | ICD-10-CM | POA: Diagnosis not present

## 2023-01-26 DIAGNOSIS — R419 Unspecified symptoms and signs involving cognitive functions and awareness: Secondary | ICD-10-CM | POA: Diagnosis not present

## 2023-01-26 DIAGNOSIS — R400 Somnolence: Secondary | ICD-10-CM | POA: Diagnosis not present

## 2023-01-27 DIAGNOSIS — R279 Unspecified lack of coordination: Secondary | ICD-10-CM | POA: Diagnosis not present

## 2023-01-27 DIAGNOSIS — R1312 Dysphagia, oropharyngeal phase: Secondary | ICD-10-CM | POA: Diagnosis not present

## 2023-01-27 DIAGNOSIS — M6281 Muscle weakness (generalized): Secondary | ICD-10-CM | POA: Diagnosis not present

## 2023-01-28 DIAGNOSIS — R279 Unspecified lack of coordination: Secondary | ICD-10-CM | POA: Diagnosis not present

## 2023-01-28 DIAGNOSIS — M6281 Muscle weakness (generalized): Secondary | ICD-10-CM | POA: Diagnosis not present

## 2023-01-29 DIAGNOSIS — R1312 Dysphagia, oropharyngeal phase: Secondary | ICD-10-CM | POA: Diagnosis not present

## 2023-02-01 DIAGNOSIS — M6281 Muscle weakness (generalized): Secondary | ICD-10-CM | POA: Diagnosis not present

## 2023-02-01 DIAGNOSIS — R279 Unspecified lack of coordination: Secondary | ICD-10-CM | POA: Diagnosis not present

## 2023-02-03 DIAGNOSIS — M6281 Muscle weakness (generalized): Secondary | ICD-10-CM | POA: Diagnosis not present

## 2023-02-03 DIAGNOSIS — R279 Unspecified lack of coordination: Secondary | ICD-10-CM | POA: Diagnosis not present

## 2023-02-05 DIAGNOSIS — M6281 Muscle weakness (generalized): Secondary | ICD-10-CM | POA: Diagnosis not present

## 2023-02-05 DIAGNOSIS — R279 Unspecified lack of coordination: Secondary | ICD-10-CM | POA: Diagnosis not present

## 2023-02-08 ENCOUNTER — Non-Acute Institutional Stay (SKILLED_NURSING_FACILITY): Payer: Medicare Other | Admitting: Orthopedic Surgery

## 2023-02-08 ENCOUNTER — Encounter: Payer: Self-pay | Admitting: Orthopedic Surgery

## 2023-02-08 DIAGNOSIS — Z8673 Personal history of transient ischemic attack (TIA), and cerebral infarction without residual deficits: Secondary | ICD-10-CM | POA: Diagnosis not present

## 2023-02-08 DIAGNOSIS — I10 Essential (primary) hypertension: Secondary | ICD-10-CM

## 2023-02-08 DIAGNOSIS — E1169 Type 2 diabetes mellitus with other specified complication: Secondary | ICD-10-CM | POA: Diagnosis not present

## 2023-02-08 DIAGNOSIS — D692 Other nonthrombocytopenic purpura: Secondary | ICD-10-CM

## 2023-02-08 DIAGNOSIS — R1312 Dysphagia, oropharyngeal phase: Secondary | ICD-10-CM

## 2023-02-08 DIAGNOSIS — E1149 Type 2 diabetes mellitus with other diabetic neurological complication: Secondary | ICD-10-CM

## 2023-02-08 DIAGNOSIS — M6281 Muscle weakness (generalized): Secondary | ICD-10-CM | POA: Diagnosis not present

## 2023-02-08 DIAGNOSIS — R634 Abnormal weight loss: Secondary | ICD-10-CM

## 2023-02-08 DIAGNOSIS — R419 Unspecified symptoms and signs involving cognitive functions and awareness: Secondary | ICD-10-CM

## 2023-02-08 DIAGNOSIS — E785 Hyperlipidemia, unspecified: Secondary | ICD-10-CM

## 2023-02-08 DIAGNOSIS — R279 Unspecified lack of coordination: Secondary | ICD-10-CM | POA: Diagnosis not present

## 2023-02-08 NOTE — Progress Notes (Signed)
Location:   [place] Room Number: 4-A Place of Service:  SNF [phone]) Provider:  Windell Moulding, NP  PCP: [name], MD  Patient Care Team: [name], MD as PCP - General (Internal Medicine) [name], DO as Consulting Physician (Neurology)  Extended Emergency Contact Information Primary Emergency Contact: [name],[name] Address: [address]          [place] [address] [name] of [place] Phone: [phone] Relation: Sister Secondary Emergency Contact: [facility] Address: [address] [place], [place] [address] [name] of [place] Mobile Phone: [phone] Relation: Brother  Code Status:  DNR Goals of care: Advanced Directive information    02/08/2023   10:05 AM  Advanced Directives  Does Patient Have a Medical Advance Directive? Yes  Type of Paramedic of [place];Out of facility DNR (pink MOST or yellow form)  Does patient want to make changes to medical advance directive? No - Patient declined  Copy of [place] in Chart? Yes - validated most recent copy scanned in chart (See row information)     Chief Complaint  Patient presents with   Medical Management of Chronic Issues    Routine Visit.   Health Maintenance    Discuss the need for Diabetic kidney evaluation.    HPI:  Pt is a 82 y.o. female seen today for medical management of chronic diseases.    She currently resides on the skilled nursing unit at [facility]. Past medical history includes: HTN, T2DM, constipation, neurocognitive disorder, osteoporosis, dyslipidemia, hx of lung nodule, lung nodule, and abnormal gait.    HTN- BUN/creat 18/0.8 10/2022, off medications at this time HLD- LDL 86 06/23/2022, remains on Lipitor Hx of TIA- remains on Plavix and statin T2DM-  A1c 6.4 (12/05)> was 6.1 (08/15)> was  7.1 (01/15/2022), no recent hypoglycemic events, blood sugars averaging 130-170's, remains on  metformin, regular diet Neurocognitive disorder- unable to complete BIMS, dependent with ADLs except feeding, no behaviors, mainly nonverbal, not on medication Dysphagia- no recent aspirations, followed by ST, remains on regular foods with nectar thick liquids Weight loss- see weights below, remains on BOOST milkshakes twice daily  03/15 increased sleepiness with decreased appetite. CXR unremarkable.   Recent blood pressures:  03/26- 112/64  03/19- 121/73  03/15- 122/63  Recent weights:  03/07- 134.5 lbs  02/01- 138 lbs  01/09- 138.7 lbs   Past Medical History:  Diagnosis Date   Diabetes mellitus    Dyslipidemia    Hypertension    Osteoporosis    Urinary incontinence    History reviewed. No pertinent surgical history.  Allergies  Allergen Reactions   Augmentin [Amoxicillin-Pot Clavulanate] Hives   Evista [Raloxifene]    Meloxicam     Unknown reaction   Scallops [Shellfish Allergy] Other (See Comments)    "I don't feel so good after an hour"   Toviaz [Fesoterodine Fumarate Er]     Unknown reaction    Allergies as of 02/08/2023       Reactions   Augmentin [amoxicillin-pot Clavulanate] Hives   Evista [raloxifene]    Meloxicam    Unknown reaction   Scallops [shellfish Allergy] Other (See Comments)   "I don't feel so good after an hour"   Toviaz [fesoterodine Fumarate Er]    Unknown reaction        Medication List        Accurate as of February 08, 2023 10:06 AM. If you have any questions, ask your nurse or doctor.          acetaminophen 325 MG tablet Commonly known as: TYLENOL Take 650 mg by mouth 2 (two) times daily.   ARTIFICIAL TEAR OP Place 1 drop into both eyes in the morning, at noon, in the evening, and at bedtime.   atorvastatin 40 MG tablet Commonly known as: LIPITOR Take 1 tablet (40 mg total) by mouth daily.   CALTRATE 600+D PO Take 1 tablet by mouth 2 (two) times daily.   cholecalciferol 25 MCG (1000 UNIT) tablet Commonly known as:  VITAMIN D3 Take 1,000 Units by mouth daily.   clopidogrel 75 MG tablet Commonly known as: PLAVIX Take 75 mg by mouth daily.   metFORMIN 500 MG tablet Commonly known as: GLUCOPHAGE Take 500 mg by mouth daily with breakfast.   metFORMIN 500 MG tablet Commonly known as: GLUCOPHAGE Take 0.5 tablets (250 mg total) by mouth daily with supper.   sennosides-docusate sodium 8.6-50 MG tablet Commonly known as: SENOKOT-S Take 1 tablet by mouth in the morning and at bedtime. Hold for loose stools   zinc oxide 20 % ointment Apply 1 application topically as needed for irritation.        Review of Systems  Unable to perform ROS: Patient nonverbal    Immunization History  Administered Date(s) Administered   Fluad Quad(high Dose 65+) 09/02/2022   Influenza Inj Mdck Quad Pf 08/10/2019   Influenza-Unspecified 08/13/2020, 09/02/2021   Moderna Covid-19 Vaccine Bivalent Booster 70yrs & up 04/22/2022   Moderna SARS-COV2 Booster Vaccination 04/16/2021   Moderna Sars-Covid-2 Vaccination 12/11/2019, 01/08/2020, 04/22/2022   PNEUMOCOCCAL CONJUGATE-20 01/08/2022   Tdap 01/07/2022   Unspecified SARS-COV-2 Vaccination 09/23/2020, 07/30/2021, 09/15/2022   Zoster Recombinat (Shingrix) 07/01/2020   Zoster, Live 07/01/2020   Pertinent  Health Maintenance Due  Topic Date Due   HEMOGLOBIN A1C  04/14/2023   INFLUENZA VACCINE  06/10/2023   FOOT EXAM  10/10/2023   OPHTHALMOLOGY EXAM  01/07/2024   DEXA SCAN  Completed      07/01/2022    2:03 PM 10/09/2022   10:05 AM 01/08/2023    1:15 PM 01/22/2023    4:41 PM 01/25/2023   11:07 AM  Fall Risk  Falls in the past year? 1 0 1 1 1   Was there an injury with Fall? 1 0 0 0 0  Fall Risk Category Calculator 3 0 1 1 1   Fall Risk Category (Retired) High Low     (RETIRED) Patient Fall Risk Level High fall risk High fall risk     Patient at Risk for Falls Due to History of fall(s);Impaired balance/gait;Impaired mobility History of fall(s);Impaired  balance/gait;Impaired mobility History of fall(s);Impaired balance/gait;Impaired mobility History of fall(s);Impaired mobility;Impaired balance/gait History of fall(s);Impaired balance/gait;Mental status change;Impaired mobility  Fall risk Follow up Falls evaluation completed;Education provided;Falls prevention discussed Falls evaluation completed Falls evaluation completed;Education provided;Falls prevention discussed Falls evaluation completed;Education provided;Falls prevention discussed Falls evaluation completed;Education provided;Falls prevention discussed   Functional Status Survey:    Vitals:   02/08/23 1003  BP: 112/64  Pulse: 88  Resp: 16  Temp: (!) 97.3 F (36.3 C)  SpO2: (!) 81%  Weight: 134 lb 8 oz (61 kg)  Height: 5\' 3"  (1.6 m)   Body mass index is 23.83 kg/m. Physical Exam Vitals reviewed.  Constitutional:      General: She is not in acute distress. HENT:     Head: Normocephalic.     Right Ear: There  is no impacted cerumen.     Left Ear: There is no impacted cerumen.     Nose: Nose normal.     Mouth/Throat:     Mouth: Mucous membranes are moist.  Eyes:     General:        Right eye: No discharge.        Left eye: No discharge.  Cardiovascular:     Rate and Rhythm: Normal rate and regular rhythm.     Pulses: Normal pulses.     Heart sounds: Normal heart sounds.  Pulmonary:     Effort: Pulmonary effort is normal. No respiratory distress.     Breath sounds: Normal breath sounds. No wheezing or rales.  Abdominal:     General: Bowel sounds are normal. There is no distension.     Palpations: Abdomen is soft.     Tenderness: There is no abdominal tenderness.  Musculoskeletal:     Cervical back: Neck supple.     Right lower leg: No edema.     Left lower leg: No edema.  Skin:    General: Skin is warm and dry.     Capillary Refill: Capillary refill takes less than 2 seconds.     Comments: Non tender purple lesions to upper extremities, R>L, vary in size, no  skin breakdown  Neurological:     General: No focal deficit present.     Mental Status: She is alert. Mental status is at baseline.     Motor: Weakness present.     Gait: Gait abnormal.     Comments: wheelchair  Psychiatric:        Mood and Affect: Mood normal.     Comments: Does not follow commands, nonverbal      Labs reviewed: Recent Labs    02/09/22 0000 03/18/22 0000 08/13/22 0000 10/13/22 0000  NA 136* 138 143 139  K 3.9 4.2  --  4.0  CL 100 101 105 104  CO2 27* 25* 29* 25*  BUN 16 21 26* 18  CREATININE 0.8 0.8 0.8 0.8  CALCIUM 9.1 10.6 9.6 9.4   Recent Labs    02/09/22 0000 08/13/22 0000 10/13/22 0000  AST 20 14 13   ALT 18 10 11   ALKPHOS 49 45 47  ALBUMIN 4.0 3.9 3.5   Recent Labs    02/09/22 0000 03/18/22 0000 08/13/22 0000 10/13/22 0000  WBC 6.5 6.3 5.4 5.2  NEUTROABS 5,434.00 3,944.00 2,857.00  --   HGB 12.8 14.2 13.5 11.9*  HCT 38 42 41 35*  PLT 204 343  --  261   Lab Results  Component Value Date   TSH 1.14 10/16/2021   Lab Results  Component Value Date   HGBA1C 6.4 10/13/2022   Lab Results  Component Value Date   CHOL 147 06/23/2022   HDL 36 06/23/2022   LDLCALC 86 06/23/2022   TRIG 150 06/23/2022   CHOLHDL 4.5 08/20/2019    Significant Diagnostic Results in last 30 days:  No results found.  Assessment/Plan 1. Primary hypertension - controlled without medication  2. Hyperlipidemia associated with type 2 diabetes mellitus - LDL stable - cont atorvastatin  3. History of TIA (transient ischemic attack) - cont atorvastatin  4. Type 2 diabetes mellitus with other neurologic complication, without long-term current use of insulin - A1c at goal, < 7 - cont metformin  5. Neurocognitive disorder - dependent with ADLs except feeding - nonverbal - ambulates with wheelchair - no behaviors - not on medication  6.  Oropharyngeal dysphagia - no recent aspirations - followed by ST - cont nectar thick fluids  7. 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

## 2023-02-10 DIAGNOSIS — M6281 Muscle weakness (generalized): Secondary | ICD-10-CM | POA: Diagnosis not present

## 2023-02-10 DIAGNOSIS — R279 Unspecified lack of coordination: Secondary | ICD-10-CM | POA: Diagnosis not present

## 2023-02-11 DIAGNOSIS — M6281 Muscle weakness (generalized): Secondary | ICD-10-CM | POA: Diagnosis not present

## 2023-02-11 DIAGNOSIS — R279 Unspecified lack of coordination: Secondary | ICD-10-CM | POA: Diagnosis not present

## 2023-02-15 DIAGNOSIS — M6281 Muscle weakness (generalized): Secondary | ICD-10-CM | POA: Diagnosis not present

## 2023-02-15 DIAGNOSIS — R279 Unspecified lack of coordination: Secondary | ICD-10-CM | POA: Diagnosis not present

## 2023-02-17 DIAGNOSIS — R4701 Aphasia: Secondary | ICD-10-CM | POA: Diagnosis not present

## 2023-02-17 DIAGNOSIS — R278 Other lack of coordination: Secondary | ICD-10-CM | POA: Diagnosis not present

## 2023-02-17 DIAGNOSIS — R279 Unspecified lack of coordination: Secondary | ICD-10-CM | POA: Diagnosis not present

## 2023-02-17 DIAGNOSIS — M6281 Muscle weakness (generalized): Secondary | ICD-10-CM | POA: Diagnosis not present

## 2023-02-18 DIAGNOSIS — R279 Unspecified lack of coordination: Secondary | ICD-10-CM | POA: Diagnosis not present

## 2023-02-18 DIAGNOSIS — M6281 Muscle weakness (generalized): Secondary | ICD-10-CM | POA: Diagnosis not present

## 2023-02-22 DIAGNOSIS — R279 Unspecified lack of coordination: Secondary | ICD-10-CM | POA: Diagnosis not present

## 2023-02-22 DIAGNOSIS — M6281 Muscle weakness (generalized): Secondary | ICD-10-CM | POA: Diagnosis not present

## 2023-02-24 DIAGNOSIS — R279 Unspecified lack of coordination: Secondary | ICD-10-CM | POA: Diagnosis not present

## 2023-02-24 DIAGNOSIS — M6281 Muscle weakness (generalized): Secondary | ICD-10-CM | POA: Diagnosis not present

## 2023-02-26 DIAGNOSIS — R279 Unspecified lack of coordination: Secondary | ICD-10-CM | POA: Diagnosis not present

## 2023-02-26 DIAGNOSIS — M6281 Muscle weakness (generalized): Secondary | ICD-10-CM | POA: Diagnosis not present

## 2023-03-11 ENCOUNTER — Non-Acute Institutional Stay (SKILLED_NURSING_FACILITY): Payer: Medicare Other | Admitting: Internal Medicine

## 2023-03-11 ENCOUNTER — Encounter: Payer: Self-pay | Admitting: Internal Medicine

## 2023-03-11 DIAGNOSIS — E1169 Type 2 diabetes mellitus with other specified complication: Secondary | ICD-10-CM

## 2023-03-11 DIAGNOSIS — R419 Unspecified symptoms and signs involving cognitive functions and awareness: Secondary | ICD-10-CM | POA: Diagnosis not present

## 2023-03-11 DIAGNOSIS — Z8673 Personal history of transient ischemic attack (TIA), and cerebral infarction without residual deficits: Secondary | ICD-10-CM | POA: Diagnosis not present

## 2023-03-11 DIAGNOSIS — E1149 Type 2 diabetes mellitus with other diabetic neurological complication: Secondary | ICD-10-CM | POA: Diagnosis not present

## 2023-03-11 DIAGNOSIS — R1312 Dysphagia, oropharyngeal phase: Secondary | ICD-10-CM

## 2023-03-11 DIAGNOSIS — E785 Hyperlipidemia, unspecified: Secondary | ICD-10-CM

## 2023-03-11 NOTE — Progress Notes (Signed)
Location:  [facility] Room Number: 4A Place of Service:  Nursing [phone]) Provider:  Mahlon Gammon, MD   Mahlon Gammon, MD  Patient Care Team: Mahlon Gammon, MD as PCP - General (Internal Medicine) [name], DO as Consulting Physician (Neurology)  Extended Emergency Contact Information Primary Emergency Contact: [name],[name] Address: [address]          [place] [address] [name] Home Phone: [phone] Relation: Sister Secondary Emergency Contact: [facility] Address: [address] [place], [place] [address] [name] Mobile Phone: [phone] Relation: Brother  Code Status:  DNR Goals of care: Advanced Directive information    03/11/2023   10:00 AM  Advanced Directives  Does Patient Have a Medical Advance Directive? Yes  Type of Estate agent of [place];Out of facility DNR (pink MOST or yellow form)  Does patient want to make changes to medical advance directive? No - Patient declined  Copy of Healthcare Power of Attorney in Chart? Yes - validated most recent copy scanned in chart (See row information)     Chief Complaint  Patient presents with   Medical Management of Chronic Issues    Patient is being sen for a routine visit   Health Maintenance    Patient is due for a micro albumin    HPI:  Pt is a 82 y.o. female seen today for medical management of chronic diseases.   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 No new Nursing issues. 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 No pertinent surgical history.  Allergies  Allergen Reactions   Augmentin [Amoxicillin-Pot Clavulanate] Hives   Evista [Raloxifene]    Meloxicam     Unknown reaction   Scallops [Shellfish Allergy] Other (See Comments)    "I don't feel so good after an hour"   Toviaz [Fesoterodine Fumarate Er]     Unknown reaction    Outpatient Encounter Medications as of 03/11/2023  Medication Sig   acetaminophen (TYLENOL) 325 MG tablet Take 650 mg by mouth 2 (two) times daily.   Amino Acids-Protein Hydrolys (PRO-STAT) LIQD Take 30 mLs by mouth daily.   ARTIFICIAL TEAR OP Place 1 drop into both eyes in the morning, at noon, in the evening, and at bedtime.   atorvastatin (LIPITOR) 40 MG tablet Take 1 tablet (40 mg total) by mouth daily.   Calcium Carb-Cholecalciferol (CALTRATE 600+D3 SOFT) 600-20 MG-MCG CHEW Chew 1 tablet by mouth in the morning and at bedtime.   cholecalciferol (VITAMIN D3) 25 MCG (1000 UNIT) tablet Take 1,000 Units by mouth daily.   clopidogrel (PLAVIX) 75 MG tablet Take 75 mg by mouth daily.   metFORMIN (GLUCOPHAGE) 500 MG tablet Take 500 mg by mouth daily with breakfast.  metFORMIN (GLUCOPHAGE) 500 MG tablet Take 0.5 tablets (250 mg total) by mouth daily with supper.   sennosides-docusate sodium (SENOKOT-S) 8.6-50 MG tablet Take 1 tablet by mouth in the morning and at bedtime. Hold for loose stools   zinc oxide 20 % ointment Apply 1 application topically as needed for irritation.   [DISCONTINUED] Calcium Carbonate-Vitamin D (CALTRATE 600+D PO) Take 1 tablet by mouth 2 (two) times daily.   No facility-administered encounter medications on file as of 03/11/2023.    Review of Systems  Unable to perform ROS: Dementia    Immunization History  Administered Date(s) Administered   Fluad  Quad(high Dose 65+) 09/02/2022   Influenza Inj Mdck Quad Pf 08/10/2019   Influenza-Unspecified 08/13/2020, 09/02/2021   Moderna Covid-19 Vaccine Bivalent Booster 38yrs & up 04/22/2022   Moderna SARS-COV2 Booster Vaccination 04/16/2021   Moderna Sars-Covid-2 Vaccination 12/11/2019, 01/08/2020, 04/22/2022   PNEUMOCOCCAL CONJUGATE-20 01/08/2022   Tdap 01/07/2022   Unspecified SARS-COV-2 Vaccination 09/23/2020, 07/30/2021, 09/15/2022   Zoster Recombinat (Shingrix) 07/01/2020   Zoster, Live 07/01/2020   Pertinent  Health Maintenance Due  Topic Date Due   HEMOGLOBIN A1C  04/14/2023   INFLUENZA VACCINE  06/10/2023   FOOT EXAM  10/10/2023   OPHTHALMOLOGY EXAM  01/07/2024   DEXA SCAN  Completed      10/09/2022   10:05 AM 01/08/2023    1:15 PM 01/22/2023    4:41 PM 01/25/2023   11:07 AM 03/11/2023    9:59 AM  Fall Risk  Falls in the past year? 0 1 1 1 1   Was there an injury with Fall? 0 0 0 0 0  Fall Risk Category Calculator 0 1 1 1 1   Fall Risk Category (Retired) Low      (RETIRED) Patient Fall Risk Level High fall risk      Patient at Risk for Falls Due to History of fall(s);Impaired balance/gait;Impaired mobility History of fall(s);Impaired balance/gait;Impaired mobility History of fall(s);Impaired mobility;Impaired balance/gait History of fall(s);Impaired balance/gait;Mental status change;Impaired mobility History of fall(s)  Fall risk Follow up Falls evaluation completed Falls evaluation completed;Education provided;Falls prevention discussed Falls evaluation completed;Education provided;Falls prevention discussed Falls evaluation completed;Education provided;Falls prevention discussed Falls evaluation completed   Functional Status Survey:    Vitals:   03/11/23 0951  BP: 128/62  Pulse: 87  Resp: 19  Temp: 97.9 F (36.6 C)  TempSrc: Temporal  SpO2: 98%  Weight: 132 lb (59.9 kg)  Height: 5\' 3"  (1.6 m)   Body mass index is 23.38 kg/m. Physical Exam Vitals reviewed.   Constitutional:      Appearance: Normal appearance.  HENT:     Head: Normocephalic.     Nose: Nose normal.     Mouth/Throat:     Mouth: Mucous membranes are moist.     Pharynx: Oropharynx is clear.  Eyes:     Pupils: Pupils are equal, round, and reactive to light.  Cardiovascular:     Rate and Rhythm: Normal rate and regular rhythm.     Pulses: Normal pulses.     Heart sounds: Normal heart sounds. No murmur heard. Pulmonary:     Effort: Pulmonary effort is normal.     Breath sounds: Normal breath sounds.  Abdominal:     General: Abdomen is flat. Bowel sounds are normal.     Palpations: Abdomen is soft.  Musculoskeletal:        General: No swelling.     Cervical back: Neck supple.  Skin:    General: Skin is warm.  Neurological:  General: No focal deficit present.     Mental Status: She is alert.     Comments: Rigidity in her hands and Arms   Noticed some Curling fingers in Hands Smiles but does not talk anymore Follows some commands    Psychiatric:        Mood and Affect: Mood normal.        Thought Content: Thought content normal.     Labs reviewed: Recent Labs    03/18/22 0000 08/13/22 0000 10/13/22 0000  NA 138 143 139  K 4.2  --  4.0  CL 101 105 104  CO2 25* 29* 25*  BUN 21 26* 18  CREATININE 0.8 0.8 0.8  CALCIUM 10.6 9.6 9.4   Recent Labs    08/13/22 0000 10/13/22 0000  AST 14 13  ALT 10 11  ALKPHOS 45 47  ALBUMIN 3.9 3.5   Recent Labs    03/18/22 0000 08/13/22 0000 10/13/22 0000  WBC 6.3 5.4 5.2  NEUTROABS 3,944.00 2,857.00  --   HGB 14.2 13.5 11.9*  HCT 42 41 35*  PLT 343  --  261   Lab Results  Component Value Date   TSH 1.14 10/16/2021   Lab Results  Component Value Date   HGBA1C 6.4 10/13/2022   Lab Results  Component Value Date   CHOL 147 06/23/2022   HDL 36 06/23/2022   LDLCALC 86 06/23/2022   TRIG 150 06/23/2022   CHOLHDL 4.5 08/20/2019    Significant Diagnostic Results in last 30 days:  No results  found.  Assessment/Plan 1. History of TIA (transient ischemic attack) Plavix and statin  2. Hyperlipidemia associated with type 2 diabetes mellitus (HCC) LDL 86 in 08/23  3. Type 2 diabetes mellitus with other neurologic complication, without long-term current use of insulin (HCC) A1C 6.4 in 12/23 On Metformin  4. Neurocognitive disorder Worsening with total depednet Skilled apprporaite  5. Oropharyngeal dysphagia Nectar thick  Family/ staff Communication:   Labs/tests ordered:

## 2023-04-12 ENCOUNTER — Non-Acute Institutional Stay (SKILLED_NURSING_FACILITY): Payer: Medicare Other | Admitting: Orthopedic Surgery

## 2023-04-12 ENCOUNTER — Encounter: Payer: Self-pay | Admitting: Orthopedic Surgery

## 2023-04-12 DIAGNOSIS — L602 Onychogryphosis: Secondary | ICD-10-CM | POA: Diagnosis not present

## 2023-04-12 DIAGNOSIS — E1169 Type 2 diabetes mellitus with other specified complication: Secondary | ICD-10-CM

## 2023-04-12 DIAGNOSIS — I10 Essential (primary) hypertension: Secondary | ICD-10-CM

## 2023-04-12 DIAGNOSIS — Z8673 Personal history of transient ischemic attack (TIA), and cerebral infarction without residual deficits: Secondary | ICD-10-CM

## 2023-04-12 DIAGNOSIS — R1312 Dysphagia, oropharyngeal phase: Secondary | ICD-10-CM

## 2023-04-12 DIAGNOSIS — R419 Unspecified symptoms and signs involving cognitive functions and awareness: Secondary | ICD-10-CM

## 2023-04-12 DIAGNOSIS — E1149 Type 2 diabetes mellitus with other diabetic neurological complication: Secondary | ICD-10-CM

## 2023-04-12 DIAGNOSIS — D692 Other nonthrombocytopenic purpura: Secondary | ICD-10-CM

## 2023-04-12 DIAGNOSIS — E785 Hyperlipidemia, unspecified: Secondary | ICD-10-CM

## 2023-04-12 DIAGNOSIS — H04123 Dry eye syndrome of bilateral lacrimal glands: Secondary | ICD-10-CM | POA: Diagnosis not present

## 2023-04-12 NOTE — Progress Notes (Signed)
Location:  [facility] Room Number: 4/A Place of Service:  Nursing [phone]) Provider:  Octavia Heir, NP   Mahlon Gammon, MD  Patient Care Team: Mahlon Gammon, MD as PCP - General (Internal Medicine) [name], DO as Consulting Physician (Neurology)  Extended Emergency Contact Information Primary Emergency Contact: [name],[name] Address: [address]          [place] [address] [place] Home Phone: [phone] Relation: Sister Secondary Emergency Contact: [facility] Address: [address] [place], [place] [address] [name] Mobile Phone: [phone] Relation: Brother  Code Status:  DNR Goals of care: Advanced Directive information    03/11/2023   10:00 AM  Advanced Directives  Does Patient Have a Medical Advance Directive? Yes  Type of Estate agent of [place];Out of facility DNR (pink MOST or yellow form)  Does patient want to make changes to medical advance directive? No - Patient declined  Copy of Healthcare Power of Attorney in Chart? Yes - validated most recent copy scanned in chart (See row information)     Chief Complaint  Patient presents with   Medical Management of Chronic Issues    HPI:  Pt is a 82 y.o. female seen today for medical management of chronic diseases.    She currently resides on the skilled nursing unit at [facility]. Past medical history includes: HTN, T2DM, constipation, neurocognitive disorder, osteoporosis, dyslipidemia, hx of lung nodule, lung nodule, and abnormal gait.   Neurocognitive disorder- dependent with ADLs except feeding, no behaviors, nonverbal, not on medication HTN- BUN/creat 27/0.9 01/22/2023, off medications at this time HLD- LDL 86 06/23/2022, remains on Lipitor Hx of TIA- remains on Plavix and statin T2DM-  A1c 6.4 (12/05)> was 6.1 (08/15)> was  7.1 (01/15/2022), no recent hypoglycemic events, remains on metformin, regular  diet Dysphagia- no recent aspirations, followed by ST, remains on regular foods with nectar thick liquids  No recent falls or injuries.   Recent blood pressures:  05/21- 106/63  05/01- 128/62  04/30- 128/62  Recent weights:  06/01- 133.7 lbs  05/05- 131.7 lbs  04/07- 132 lbs       Past Medical History:  Diagnosis Date   Diabetes mellitus    Dyslipidemia    Hypertension    Osteoporosis    Urinary incontinence    No past surgical history on file.  Allergies  Allergen Reactions   Augmentin [Amoxicillin-Pot Clavulanate] Hives   Evista [Raloxifene]    Meloxicam     Unknown reaction   Scallops [Shellfish Allergy] Other (See Comments)    "I don't feel so good after an hour"   Toviaz [Fesoterodine Fumarate Er]     Unknown reaction    Outpatient Encounter Medications as of 04/12/2023  Medication Sig   acetaminophen (TYLENOL) 325 MG tablet Take 650 mg by mouth 2 (two) times daily.   Amino Acids-Protein Hydrolys (PRO-STAT) LIQD Take 30 mLs by mouth daily.   ARTIFICIAL TEAR OP Place 1 drop into both eyes in the morning, at noon, in the evening, and at bedtime.   atorvastatin (LIPITOR) 40 MG tablet Take 1 tablet (40 mg total) by mouth daily.   Calcium Carb-Cholecalciferol (CALTRATE 600+D3 SOFT) 600-20 MG-MCG CHEW Chew 1 tablet by mouth in the morning and at bedtime.   cholecalciferol (VITAMIN D3) 25 MCG (1000 UNIT) tablet Take 1,000 Units by mouth daily.   clopidogrel (PLAVIX) 75 MG  tablet Take 75 mg by mouth daily.   metFORMIN (GLUCOPHAGE) 500 MG tablet Take 500 mg by mouth daily with breakfast.   metFORMIN (GLUCOPHAGE) 500 MG tablet Take 0.5 tablets (250 mg total) by mouth daily with supper.   sennosides-docusate sodium (SENOKOT-S) 8.6-50 MG tablet Take 1 tablet by mouth in the morning and at bedtime. Hold for loose stools   zinc oxide 20 % ointment Apply 1 application topically as needed for irritation.   No facility-administered encounter medications on file as of  04/12/2023.    Review of Systems  Unable to perform ROS: Patient nonverbal    Immunization History  Administered Date(s) Administered   Fluad Quad(high Dose 65+) 09/02/2022   Influenza Inj Mdck Quad Pf 08/10/2019   Influenza-Unspecified 08/13/2020, 09/02/2021   Moderna Covid-19 Vaccine Bivalent Booster 46yrs & up 04/22/2022   Moderna SARS-COV2 Booster Vaccination 04/16/2021   Moderna Sars-Covid-2 Vaccination 12/11/2019, 01/08/2020, 04/22/2022   PNEUMOCOCCAL CONJUGATE-20 01/08/2022   Tdap 01/07/2022   Unspecified SARS-COV-2 Vaccination 09/23/2020, 07/30/2021, 09/15/2022   Zoster Recombinat (Shingrix) 07/01/2020   Zoster, Live 07/01/2020   Pertinent  Health Maintenance Due  Topic Date Due   HEMOGLOBIN A1C  04/14/2023   INFLUENZA VACCINE  06/10/2023   FOOT EXAM  10/10/2023   OPHTHALMOLOGY EXAM  01/07/2024   DEXA SCAN  Completed      10/09/2022   10:05 AM 01/08/2023    1:15 PM 01/22/2023    4:41 PM 01/25/2023   11:07 AM 03/11/2023    9:59 AM  Fall Risk  Falls in the past year? 0 1 1 1 1   Was there an injury with Fall? 0 0 0 0 0  Fall Risk Category Calculator 0 1 1 1 1   Fall Risk Category (Retired) Low      (RETIRED) Patient Fall Risk Level High fall risk      Patient at Risk for Falls Due to History of fall(s);Impaired balance/gait;Impaired mobility History of fall(s);Impaired balance/gait;Impaired mobility History of fall(s);Impaired mobility;Impaired balance/gait History of fall(s);Impaired balance/gait;Mental status change;Impaired mobility History of fall(s)  Fall risk Follow up Falls evaluation completed Falls evaluation completed;Education provided;Falls prevention discussed Falls evaluation completed;Education provided;Falls prevention discussed Falls evaluation completed;Education provided;Falls prevention discussed Falls evaluation completed   Functional Status Survey:    Vitals:   04/12/23 0933  BP: 106/63  Pulse: 79  Resp: 18  Temp: 97.6 F (36.4 C)  SpO2: 97%   Weight: 133 lb 11.2 oz (60.6 kg)  Height: 5\' 3"  (1.6 m)   Body mass index is 23.68 kg/m. Physical Exam Vitals reviewed.  Constitutional:      General: She is not in acute distress. HENT:     Head: Normocephalic.     Right Ear: There is no impacted cerumen.     Left Ear: There is no impacted cerumen.     Nose: Nose normal.     Mouth/Throat:     Mouth: Mucous membranes are moist.  Eyes:     General:        Right eye: No discharge.        Left eye: No discharge.  Cardiovascular:     Rate and Rhythm: Normal rate and regular rhythm.     Pulses:          Dorsalis pedis pulses are 2+ on the right side and 2+ on the left side.       Posterior tibial pulses are 2+ on the right side and 2+ on the left side.  Heart sounds: Normal heart sounds.  Pulmonary:     Effort: Pulmonary effort is normal. No respiratory distress.     Breath sounds: Normal breath sounds. No wheezing.  Abdominal:     General: Bowel sounds are normal. There is no distension.     Palpations: Abdomen is soft.     Tenderness: There is no abdominal tenderness.  Musculoskeletal:     Cervical back: Neck supple.     Right lower leg: No edema.     Left lower leg: No edema.  Feet:     Right foot:     Toenail Condition: Right toenails are long. Fungal disease present.    Left foot:     Skin integrity: Skin integrity normal.     Toenail Condition: Left toenails are long. Fungal disease present. Skin:    General: Skin is warm.     Capillary Refill: Capillary refill takes less than 2 seconds.     Comments: Senile purpura to right forearm  Neurological:     General: No focal deficit present.     Mental Status: She is alert. Mental status is at baseline.     Motor: Weakness present.     Gait: Gait abnormal.     Comments: ambulates with wheelchair  Psychiatric:        Mood and Affect: Mood normal.     Comments: Follows commands, alert to self/familiar face, nonverbal     Labs reviewed: Recent Labs     08/13/22 0000 10/13/22 0000 01/22/23 0000  NA 143 139 139  K  --  4.0 4.1  CL 105 104 104  CO2 29* 25* 26*  BUN 26* 18 27*  CREATININE 0.8 0.8 0.9  CALCIUM 9.6 9.4 9.6   Recent Labs    08/13/22 0000 10/13/22 0000  AST 14 13  ALT 10 11  ALKPHOS 45 47  ALBUMIN 3.9 3.5   Recent Labs    08/13/22 0000 10/13/22 0000 01/22/23 0000  WBC 5.4 5.2 11.0  NEUTROABS 2,857.00  --  8,217.00  HGB 13.5 11.9* 12.8  HCT 41 35* 37  PLT  --  261 258   Lab Results  Component Value Date   TSH 1.14 10/16/2021   Lab Results  Component Value Date   HGBA1C 6.4 10/13/2022   Lab Results  Component Value Date   CHOL 147 06/23/2022   HDL 36 06/23/2022   LDLCALC 86 06/23/2022   TRIG 150 06/23/2022   CHOLHDL 4.5 08/20/2019    Significant Diagnostic Results in last 30 days:  No results found.  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 Neurocognitive disorder - dependent with ADLs except feeding - no behaviors - not on medication - cont skilled nursing  3. Primary hypertension - controlled without medication  4. Hyperlipidemia associated with type 2 diabetes mellitus (HCC) - LDL 86 06/2022 - cont atorvastatin  5. History of TIA (transient ischemic attack) - cont atorvastatin and Plavix  6. Type 2 diabetes mellitus with other neurologic complication, without long-term current use of insulin (HCC) - A1c stable - no hypoglycemias - on metformin - A1c- future  7. Oropharyngeal dysphagia - evaluated by ST in past - cont regular diet with nectar fluids  8. 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

## 2023-04-15 DIAGNOSIS — E119 Type 2 diabetes mellitus without complications: Secondary | ICD-10-CM | POA: Diagnosis not present

## 2023-04-16 ENCOUNTER — Non-Acute Institutional Stay (SKILLED_NURSING_FACILITY): Payer: Medicare Other | Admitting: Orthopedic Surgery

## 2023-04-16 ENCOUNTER — Encounter: Payer: Self-pay | Admitting: Orthopedic Surgery

## 2023-04-16 DIAGNOSIS — E1149 Type 2 diabetes mellitus with other diabetic neurological complication: Secondary | ICD-10-CM | POA: Diagnosis not present

## 2023-04-16 DIAGNOSIS — R634 Abnormal weight loss: Secondary | ICD-10-CM

## 2023-04-16 LAB — HEMOGLOBIN A1C: Hemoglobin A1C: 8.2

## 2023-04-16 NOTE — Progress Notes (Signed)
Location:  [facility] Room Number: 4/A Place of Service:  SNF [phone]) Provider:  Octavia Heir, NP   Mahlon Gammon, MD  Patient Care Team: Mahlon Gammon, MD as PCP - General (Internal Medicine) [name], DO as Consulting Physician (Neurology)  Extended Emergency Contact Information Primary Emergency Contact: [name],[name] Address: [address]          [place] [address] [place] Home Phone: [phone] Relation: Sister Secondary Emergency Contact: [facility] Address: [address] [place], [place] [address] [name] Mobile Phone: [phone] Relation: Brother  Code Status:  DNR Goals of care: Advanced Directive information    03/11/2023   10:00 AM  Advanced Directives  Does Patient Have a Medical Advance Directive? Yes  Type of Estate agent of [place];Out of facility DNR (pink MOST or yellow form)  Does patient want to make changes to medical advance directive? No - Patient declined  Copy of Healthcare Power of Attorney in Chart? Yes - validated most recent copy scanned in chart (See row information)     Chief Complaint  Patient presents with   Acute Visit    Abnormal labs    HPI:  Pt is a 82 y.o. female seen today for acute visit due to abnormal lab work.   She currently resides on the skilled nursing unit at [facility]. Past medical history includes: HTN, T2DM, constipation, neurocognitive disorder, osteoporosis, dyslipidemia, hx of lung nodule, lung nodule, and abnormal gait.   A1c now 8.2> was 6.4 (12/05). Due to progressive weight loss and reduced A1c, metformin was reduced 10/2022. Recent blood sugars averaging 200-300's. No recent hypoglycemias. She is receiving Boost with ice cream daily due to weight loss. Weight down 10 lbs from last year.    No past surgical history on file.  Allergies  Allergen Reactions   Augmentin [Amoxicillin-Pot Clavulanate] Hives    Evista [Raloxifene]    Meloxicam     Unknown reaction   Scallops [Shellfish Allergy] Other (See Comments)    "I don't feel so good after an hour"   Toviaz [Fesoterodine Fumarate Er]     Unknown reaction    Outpatient Encounter Medications as of 04/16/2023  Medication Sig   acetaminophen (TYLENOL) 325 MG tablet Take 650 mg by mouth 2 (two) times daily.   Amino Acids-Protein Hydrolys (FEEDING SUPPLEMENT, PRO-STAT SUGAR FREE 64,) LIQD Take 30 mLs by mouth daily with breakfast.   Amino Acids-Protein Hydrolys (PRO-STAT) LIQD Take 30 mLs by mouth daily.   ARTIFICIAL TEAR OP Place 1 drop into both eyes in the morning, at noon, in the evening, and at bedtime.   atorvastatin (LIPITOR) 40 MG tablet Take 1 tablet (40 mg total) by mouth daily.   Calcium Carb-Cholecalciferol (CALTRATE 600+D3 SOFT) 600-20 MG-MCG CHEW Chew 1 tablet by mouth in the morning and at bedtime.   cholecalciferol (VITAMIN D3) 25 MCG (1000 UNIT) tablet Take 1,000 Units by mouth daily.   clopidogrel (PLAVIX) 75 MG tablet Take 75 mg by mouth daily.   melatonin 3 MG TABS tablet Take 3 mg by mouth at bedtime.   metFORMIN (GLUCOPHAGE) 500 MG tablet Take 500 mg by mouth daily with breakfast.   metFORMIN (GLUCOPHAGE) 500 MG tablet Take 0.5 tablets (250 mg total) by mouth daily with supper.   sennosides-docusate sodium (SENOKOT-S) 8.6-50 MG tablet Take 1 tablet by mouth in the morning and at bedtime.  Hold for loose stools   zinc oxide 20 % ointment Apply 1 application topically as needed for irritation.   No facility-administered encounter medications on file as of 04/16/2023.    Review of Systems  Unable to perform ROS: Patient nonverbal    Immunization History  Administered Date(s) Administered   Fluad Quad(high Dose 65+) 09/02/2022   Influenza Inj Mdck Quad Pf 08/10/2019   Influenza-Unspecified 08/13/2020, 09/02/2021   Moderna Covid-19 Vaccine Bivalent Booster 82yrs & up 04/22/2022   Moderna SARS-COV2 Booster Vaccination  04/16/2021   Moderna Sars-Covid-2 Vaccination 12/11/2019, 01/08/2020, 04/22/2022   PNEUMOCOCCAL CONJUGATE-20 01/08/2022   Tdap 01/07/2022   Unspecified SARS-COV-2 Vaccination 09/23/2020, 07/30/2021, 09/15/2022   Zoster Recombinat (Shingrix) 07/01/2020   Zoster, Live 07/01/2020   Pertinent  Health Maintenance Due  Topic Date Due   HEMOGLOBIN A1C  04/14/2023   INFLUENZA VACCINE  06/10/2023   OPHTHALMOLOGY EXAM  01/07/2024   FOOT EXAM  04/11/2024   DEXA SCAN  Completed      01/08/2023    1:15 PM 01/22/2023    4:41 PM 01/25/2023   11:07 AM 03/11/2023    9:59 AM 04/12/2023   11:10 AM  Fall Risk  Falls in the past year? 1 1 1 1 1   Was there an injury with Fall? 0 0 0 0 0  Fall Risk Category Calculator 1 1 1 1 1   Patient at Risk for Falls Due to History of fall(s);Impaired balance/gait;Impaired mobility History of fall(s);Impaired mobility;Impaired balance/gait History of fall(s);Impaired balance/gait;Mental status change;Impaired mobility History of fall(s) History of fall(s);Impaired balance/gait;Impaired mobility  Fall risk Follow up Falls evaluation completed;Education provided;Falls prevention discussed Falls evaluation completed;Education provided;Falls prevention discussed Falls evaluation completed;Education provided;Falls prevention discussed Falls evaluation completed Falls evaluation completed;Education provided;Falls prevention discussed   Functional Status Survey:    Vitals:   04/16/23 1414  BP: 108/64  Pulse: 75  Resp: 20  Temp: 98.1 F (36.7 C)  SpO2: 97%  Weight: 133 lb 11.2 oz (60.6 kg)  Height: 5\' 3"  (1.6 m)   Body mass index is 23.68 kg/m. Physical Exam Vitals reviewed.  Constitutional:      General: She is not in acute distress. HENT:     Head: Normocephalic.  Eyes:     General:        Right eye: No discharge.        Left eye: No discharge.  Cardiovascular:     Rate and Rhythm: Normal rate and regular rhythm.     Pulses: Normal pulses.     Heart sounds:  Normal heart sounds.  Pulmonary:     Effort: Pulmonary effort is normal.     Breath sounds: Normal breath sounds.  Abdominal:     General: Bowel sounds are normal.     Palpations: Abdomen is soft.  Musculoskeletal:     Cervical back: Neck supple.     Right lower leg: No edema.     Left lower leg: No edema.  Skin:    General: Skin is warm.     Capillary Refill: Capillary refill takes less than 2 seconds.  Neurological:     Mental Status: She is alert. Mental status is at baseline.     Motor: Weakness present.     Gait: Gait abnormal.  Psychiatric:        Mood and Affect: Mood normal.     Comments: Nonverbal, does not follow commands     Labs reviewed: Recent Labs    08/13/22 0000 10/13/22 0000 01/22/23 0000  NA 143 139 139  K  --  4.0 4.1  CL 105 104 104  CO2 29* 25* 26*  BUN 26* 18 27*  CREATININE 0.8 0.8 0.9  CALCIUM 9.6 9.4 9.6   Recent Labs    08/13/22 0000 10/13/22 0000  AST 14 13  ALT 10 11  ALKPHOS 45 47  ALBUMIN 3.9 3.5   Recent Labs    08/13/22 0000 10/13/22 0000 01/22/23 0000  WBC 5.4 5.2 11.0  NEUTROABS 2,857.00  --  8,217.00  HGB 13.5 11.9* 12.8  HCT 41 35* 37  PLT  --  261 258   Lab Results  Component Value Date   TSH 1.14 10/16/2021   Lab Results  Component Value Date   HGBA1C 6.4 10/13/2022   Lab Results  Component Value Date   CHOL 147 06/23/2022   HDL 36 06/23/2022   LDLCALC 86 06/23/2022   TRIG 150 06/23/2022   CHOLHDL 4.5 08/20/2019    Significant Diagnostic Results in last 30 days:  No results found.  Assessment/Plan 1. Type 2 diabetes mellitus with other neurologic complication, without long-term current use of insulin (HCC) - A1c 8.2> was 6.4 (10/2022) - metformin was reduced due to weight loss and poor appetite - will change Boost to Glucerna - cont metformin  - cont weekly CBG - recommend recheck A1c in 3 months  2. Weight loss - weight down 10 lbs within past year - BMI 23.68 - protein 5.7, albumin 3.5  10/2022 - cont ice cream shakes  - see above     Family/ staff Communication: plan discussed with patient and nurse  Labs/tests ordered:  A1c in 3 months

## 2023-04-23 DIAGNOSIS — M79672 Pain in left foot: Secondary | ICD-10-CM | POA: Diagnosis not present

## 2023-04-23 DIAGNOSIS — M79671 Pain in right foot: Secondary | ICD-10-CM | POA: Diagnosis not present

## 2023-04-23 DIAGNOSIS — E119 Type 2 diabetes mellitus without complications: Secondary | ICD-10-CM | POA: Diagnosis not present

## 2023-04-23 DIAGNOSIS — L602 Onychogryphosis: Secondary | ICD-10-CM | POA: Diagnosis not present

## 2023-05-10 ENCOUNTER — Non-Acute Institutional Stay (SKILLED_NURSING_FACILITY): Payer: Medicare Other | Admitting: Orthopedic Surgery

## 2023-05-10 ENCOUNTER — Encounter: Payer: Self-pay | Admitting: Orthopedic Surgery

## 2023-05-10 DIAGNOSIS — H6123 Impacted cerumen, bilateral: Secondary | ICD-10-CM | POA: Diagnosis not present

## 2023-05-10 DIAGNOSIS — E785 Hyperlipidemia, unspecified: Secondary | ICD-10-CM

## 2023-05-10 DIAGNOSIS — R419 Unspecified symptoms and signs involving cognitive functions and awareness: Secondary | ICD-10-CM | POA: Diagnosis not present

## 2023-05-10 DIAGNOSIS — R270 Ataxia, unspecified: Secondary | ICD-10-CM

## 2023-05-10 DIAGNOSIS — E1149 Type 2 diabetes mellitus with other diabetic neurological complication: Secondary | ICD-10-CM | POA: Diagnosis not present

## 2023-05-10 DIAGNOSIS — Z8673 Personal history of transient ischemic attack (TIA), and cerebral infarction without residual deficits: Secondary | ICD-10-CM

## 2023-05-10 DIAGNOSIS — R1312 Dysphagia, oropharyngeal phase: Secondary | ICD-10-CM

## 2023-05-10 MED ORDER — DEBROX 6.5 % OT SOLN: 5.0000 [drp] | Freq: Every evening | OTIC | 0 refills | Status: AC

## 2023-05-10 NOTE — Progress Notes (Signed)
Location:   [facility] Room Number: 4-A Place of Service:  SNF [phone]) Provider:  Hazle Nordmann, NP  PCP: Mahlon Gammon, MD  Patient Care Team: Mahlon Gammon, MD as PCP - General (Internal Medicine) [name], DO as Consulting Physician (Neurology)  Extended Emergency Contact Information Primary Emergency Contact: [name],[name] Address: [address]          [place] [address] [name] Home Phone: [phone] Relation: Sister Secondary Emergency Contact: [facility] Address: [address] [place], [place] [address] [name] Mobile Phone: [phone] Relation: Brother  Code Status:  DNR Goals of care: Advanced Directive information    05/10/2023    9:51 AM  Advanced Directives  Does Patient Have a Medical Advance Directive? Yes  Type of Estate agent of [place];Out of facility DNR (pink MOST or yellow form)  Does patient want to make changes to medical advance directive? No - Patient declined  Copy of Healthcare Power of Attorney in Chart? Yes - validated most recent copy scanned in chart (See row information)     Chief Complaint  Patient presents with   Medical Management of Chronic Issues    Routine Visit.   Health Maintenance    Discuss the need for Diabetic kidney evaluation, and Hemoglobin A1C.    Immunizations    Discuss the need for Shingrix vaccine.     HPI:  Pt is a 82 y.o. female seen today for medical management of chronic diseases.    She currently resides on the skilled nursing unit at [facility]. Past medical history includes: HTN, T2DM, constipation, neurocognitive disorder, osteoporosis, dyslipidemia, hx of lung nodule, lung nodule, and abnormal gait.    Neurocognitive disorder- dependent with ADLs except feeding, no behaviors, nonverbal, not on medication Ataxia- involves both hands, evaluated by OT, wearing protective lams wool brace at night, keep nails  short to prevent skin breakdown T2DM-  A1c 8.2 (06/07)> was 6.4 (12/05), no recent hypoglycemic events, blood sugars averaging 140-170's, remains on metformin, regular diet HLD- LDL 86 06/23/2022, remains on Lipitor Hx of TIA- remains on Plavix and statin Dysphagia- no recent aspirations, followed by ST, remains on regular foods with nectar thick liquids  No recent falls or injuries.    Recent blood pressures:  06/25- 114/59  06/18- 118/68  06/11- 103/62  Recent weights:  06/01- 133.7 lbs  05/15- 131.7 lbs  04/07- 132 lbs    Past Medical History:  Diagnosis Date   Diabetes mellitus    Dyslipidemia    Hypertension    Osteoporosis    Urinary incontinence    History reviewed. No pertinent surgical history.  Allergies  Allergen Reactions   Augmentin [Amoxicillin-Pot Clavulanate] Hives   Evista [Raloxifene]    Meloxicam     Unknown reaction   Scallops [Shellfish Allergy] Other (See Comments)    "I don't feel so good after an hour"   Toviaz [Fesoterodine Fumarate Er]     Unknown reaction    Allergies as of 05/10/2023       Reactions   Augmentin [amoxicillin-pot Clavulanate] Hives   Evista [raloxifene]    Meloxicam    Unknown reaction   Scallops [shellfish Allergy] Other (See Comments)   "I don't feel so good after an hour"   Toviaz [fesoterodine Fumarate Er]    Unknown reaction        Medication List  Accurate as of May 10, 2023  9:53 AM. If you have any questions, ask your nurse or doctor.          acetaminophen 325 MG tablet Commonly known as: TYLENOL Take 650 mg by mouth 2 (two) times daily.   ARTIFICIAL TEAR OP Place 1 drop into both eyes in the morning, at noon, in the evening, and at bedtime.   atorvastatin 40 MG tablet Commonly known as: LIPITOR Take 1 tablet (40 mg total) by mouth daily.   Caltrate 600+D3 Soft 600-20 MG-MCG Chew Generic drug: Calcium Carb-Cholecalciferol Chew 1 tablet by mouth in the morning and at bedtime.    cholecalciferol 25 MCG (1000 UNIT) tablet Commonly known as: VITAMIN D3 Take 1,000 Units by mouth daily.   clopidogrel 75 MG tablet Commonly known as: PLAVIX Take 75 mg by mouth daily.   feeding supplement (PRO-STAT SUGAR FREE 64) Liqd Take 30 mLs by mouth daily with breakfast.   melatonin 3 MG Tabs tablet Take 3 mg by mouth at bedtime.   metFORMIN 500 MG tablet Commonly known as: GLUCOPHAGE Take 500 mg by mouth daily with breakfast.   METFORMIN HCL PO Take 250 mg by mouth every evening.   sennosides-docusate sodium 8.6-50 MG tablet Commonly known as: SENOKOT-S Take 1 tablet by mouth in the morning and at bedtime. Hold for loose stools   zinc oxide 20 % ointment Apply 1 application topically as needed for irritation.        Review of Systems  Unable to perform ROS: Dementia    Immunization History  Administered Date(s) Administered   Fluad Quad(high Dose 65+) 09/02/2022   Influenza Inj Mdck Quad Pf 08/10/2019   Influenza-Unspecified 08/13/2020, 09/02/2021   Moderna Covid-19 Vaccine Bivalent Booster 79yrs & up 04/22/2022   Moderna SARS-COV2 Booster Vaccination 04/16/2021   Moderna Sars-Covid-2 Vaccination 12/11/2019, 01/08/2020, 04/22/2022   PNEUMOCOCCAL CONJUGATE-20 01/08/2022   Tdap 01/07/2022   Unspecified SARS-COV-2 Vaccination 09/23/2020, 07/30/2021, 09/15/2022   Zoster Recombinant(Shingrix) 07/01/2020   Zoster, Live 07/01/2020   Pertinent  Health Maintenance Due  Topic Date Due   INFLUENZA VACCINE  06/10/2023   HEMOGLOBIN A1C  10/16/2023   OPHTHALMOLOGY EXAM  04/11/2024   FOOT EXAM  04/15/2024   DEXA SCAN  Completed      01/22/2023    4:41 PM 01/25/2023   11:07 AM 03/11/2023    9:59 AM 04/12/2023   11:10 AM 04/16/2023    2:26 PM  Fall Risk  Falls in the past year? 1 1 1 1  0  Was there an injury with Fall? 0 0 0 0 0  Fall Risk Category Calculator 1 1 1 1  0  Patient at Risk for Falls Due to History of fall(s);Impaired mobility;Impaired balance/gait  History of fall(s);Impaired balance/gait;Mental status change;Impaired mobility History of fall(s) History of fall(s);Impaired balance/gait;Impaired mobility History of fall(s);Impaired balance/gait;Impaired mobility  Fall risk Follow up Falls evaluation completed;Education provided;Falls prevention discussed Falls evaluation completed;Education provided;Falls prevention discussed Falls evaluation completed Falls evaluation completed;Education provided;Falls prevention discussed Falls evaluation completed;Education provided;Falls prevention discussed   Functional Status Survey:    Vitals:   05/10/23 0949  BP: (!) 114/59  Pulse: 73  Resp: 20  Temp: 97.9 F (36.6 C)  SpO2: 97%  Weight: 133 lb 11.2 oz (60.6 kg)  Height: 5\' 3"  (1.6 m)   Body mass index is 23.68 kg/m. Physical Exam Vitals reviewed.  Constitutional:      General: She is not in acute distress. HENT:     Head: Normocephalic.  Right Ear: There is impacted cerumen.     Left Ear: There is impacted cerumen.     Nose: Nose normal.     Mouth/Throat:     Mouth: Mucous membranes are moist.  Eyes:     General:        Right eye: No discharge.        Left eye: No discharge.  Cardiovascular:     Rate and Rhythm: Normal rate and regular rhythm.     Pulses: Normal pulses.     Heart sounds: Normal heart sounds.  Pulmonary:     Effort: Pulmonary effort is normal. No respiratory distress.     Breath sounds: Normal breath sounds. No wheezing.  Abdominal:     General: Bowel sounds are normal. There is no distension.     Palpations: Abdomen is soft.     Tenderness: There is no abdominal tenderness.  Musculoskeletal:     Cervical back: Neck supple.     Right lower leg: No edema.     Left lower leg: No edema.  Skin:    General: Skin is warm.     Capillary Refill: Capillary refill takes less than 2 seconds.  Neurological:     General: No focal deficit present.     Mental Status: She is alert. Mental status is at baseline.      Motor: Weakness present.     Gait: Gait abnormal.     Comments: Hands clinched position, FROM to both hands  Psychiatric:        Mood and Affect: Mood normal.     Comments: Non verbal, does not follow commands, alert to self     Labs reviewed: Recent Labs    08/13/22 0000 10/13/22 0000 01/22/23 0000  NA 143 139 139  K  --  4.0 4.1  CL 105 104 104  CO2 29* 25* 26*  BUN 26* 18 27*  CREATININE 0.8 0.8 0.9  CALCIUM 9.6 9.4 9.6   Recent Labs    08/13/22 0000 10/13/22 0000  AST 14 13  ALT 10 11  ALKPHOS 45 47  ALBUMIN 3.9 3.5   Recent Labs    08/13/22 0000 10/13/22 0000 01/22/23 0000  WBC 5.4 5.2 11.0  NEUTROABS 2,857.00  --  8,217.00  HGB 13.5 11.9* 12.8  HCT 41 35* 37  PLT  --  261 258   Lab Results  Component Value Date   TSH 1.14 10/16/2021   Lab Results  Component Value Date   HGBA1C 8.2 04/16/2023   Lab Results  Component Value Date   CHOL 147 06/23/2022   HDL 36 06/23/2022   LDLCALC 86 06/23/2022   TRIG 150 06/23/2022   CHOLHDL 4.5 08/20/2019    Significant Diagnostic Results in last 30 days:  No results found.  Assessment/Plan 1. Impacted cerumen of both ears - start Debrox- 5gtts to both ears at bedtime x 5 days - flush ears when Debrox complete  2. Neurocognitive disorder - dependent with ADLs except feeding - weights stable - nonverbal - cont skilled nursing   3. Ataxia - evaluated by OT - clenching fists - lams wool brace at bedtime  - keep nails trimmed   4. Type 2 diabetes mellitus with other neurologic complication, without long-term current use of insulin (HCC) - A1c stable - no further urine microalbumin per goals of care - no hypoglycemias - cont metformin   5. Dyslipidemia - Total 147, LDL 86 06/2022 - cont statin  6. History of  TIA (transient ischemic attack) - cont Plavix and atorvastatin  7. Oropharyngeal dysphagia - followed by ST - no recent aspirations - cont nectar fluids    Family/ staff  Communication: plan discussed with patient, sister and nurse  Labs/tests ordered: none

## 2023-05-21 DIAGNOSIS — R1312 Dysphagia, oropharyngeal phase: Secondary | ICD-10-CM | POA: Diagnosis not present

## 2023-05-21 DIAGNOSIS — G300 Alzheimer's disease with early onset: Secondary | ICD-10-CM | POA: Diagnosis not present

## 2023-05-24 DIAGNOSIS — R1312 Dysphagia, oropharyngeal phase: Secondary | ICD-10-CM | POA: Diagnosis not present

## 2023-05-24 DIAGNOSIS — G300 Alzheimer's disease with early onset: Secondary | ICD-10-CM | POA: Diagnosis not present

## 2023-05-25 DIAGNOSIS — R1312 Dysphagia, oropharyngeal phase: Secondary | ICD-10-CM | POA: Diagnosis not present

## 2023-05-25 DIAGNOSIS — G300 Alzheimer's disease with early onset: Secondary | ICD-10-CM | POA: Diagnosis not present

## 2023-05-26 DIAGNOSIS — G300 Alzheimer's disease with early onset: Secondary | ICD-10-CM | POA: Diagnosis not present

## 2023-05-26 DIAGNOSIS — R1312 Dysphagia, oropharyngeal phase: Secondary | ICD-10-CM | POA: Diagnosis not present

## 2023-05-27 DIAGNOSIS — R1312 Dysphagia, oropharyngeal phase: Secondary | ICD-10-CM | POA: Diagnosis not present

## 2023-05-27 DIAGNOSIS — G300 Alzheimer's disease with early onset: Secondary | ICD-10-CM | POA: Diagnosis not present

## 2023-05-28 DIAGNOSIS — R1312 Dysphagia, oropharyngeal phase: Secondary | ICD-10-CM | POA: Diagnosis not present

## 2023-05-28 DIAGNOSIS — G300 Alzheimer's disease with early onset: Secondary | ICD-10-CM | POA: Diagnosis not present

## 2023-05-31 DIAGNOSIS — R1312 Dysphagia, oropharyngeal phase: Secondary | ICD-10-CM | POA: Diagnosis not present

## 2023-05-31 DIAGNOSIS — G300 Alzheimer's disease with early onset: Secondary | ICD-10-CM | POA: Diagnosis not present

## 2023-06-01 DIAGNOSIS — G300 Alzheimer's disease with early onset: Secondary | ICD-10-CM | POA: Diagnosis not present

## 2023-06-01 DIAGNOSIS — R1312 Dysphagia, oropharyngeal phase: Secondary | ICD-10-CM | POA: Diagnosis not present

## 2023-06-03 DIAGNOSIS — R1312 Dysphagia, oropharyngeal phase: Secondary | ICD-10-CM | POA: Diagnosis not present

## 2023-06-03 DIAGNOSIS — G300 Alzheimer's disease with early onset: Secondary | ICD-10-CM | POA: Diagnosis not present

## 2023-06-04 DIAGNOSIS — R1312 Dysphagia, oropharyngeal phase: Secondary | ICD-10-CM | POA: Diagnosis not present

## 2023-06-04 DIAGNOSIS — G300 Alzheimer's disease with early onset: Secondary | ICD-10-CM | POA: Diagnosis not present

## 2023-06-07 DIAGNOSIS — R1312 Dysphagia, oropharyngeal phase: Secondary | ICD-10-CM | POA: Diagnosis not present

## 2023-06-07 DIAGNOSIS — G300 Alzheimer's disease with early onset: Secondary | ICD-10-CM | POA: Diagnosis not present

## 2023-06-08 DIAGNOSIS — R1312 Dysphagia, oropharyngeal phase: Secondary | ICD-10-CM | POA: Diagnosis not present

## 2023-06-08 DIAGNOSIS — G300 Alzheimer's disease with early onset: Secondary | ICD-10-CM | POA: Diagnosis not present

## 2023-06-10 DIAGNOSIS — G300 Alzheimer's disease with early onset: Secondary | ICD-10-CM | POA: Diagnosis not present

## 2023-06-10 DIAGNOSIS — R1312 Dysphagia, oropharyngeal phase: Secondary | ICD-10-CM | POA: Diagnosis not present

## 2023-06-11 DIAGNOSIS — G300 Alzheimer's disease with early onset: Secondary | ICD-10-CM | POA: Diagnosis not present

## 2023-06-11 DIAGNOSIS — R1312 Dysphagia, oropharyngeal phase: Secondary | ICD-10-CM | POA: Diagnosis not present

## 2023-06-14 ENCOUNTER — Non-Acute Institutional Stay (SKILLED_NURSING_FACILITY): Payer: Medicare Other | Admitting: Orthopedic Surgery

## 2023-06-14 ENCOUNTER — Encounter: Payer: Self-pay | Admitting: Orthopedic Surgery

## 2023-06-14 DIAGNOSIS — S51812A Laceration without foreign body of left forearm, initial encounter: Secondary | ICD-10-CM | POA: Diagnosis not present

## 2023-06-14 DIAGNOSIS — R1312 Dysphagia, oropharyngeal phase: Secondary | ICD-10-CM | POA: Diagnosis not present

## 2023-06-14 DIAGNOSIS — E1169 Type 2 diabetes mellitus with other specified complication: Secondary | ICD-10-CM

## 2023-06-14 DIAGNOSIS — L89312 Pressure ulcer of right buttock, stage 2: Secondary | ICD-10-CM | POA: Diagnosis not present

## 2023-06-14 DIAGNOSIS — E1149 Type 2 diabetes mellitus with other diabetic neurological complication: Secondary | ICD-10-CM | POA: Diagnosis not present

## 2023-06-14 DIAGNOSIS — Z8673 Personal history of transient ischemic attack (TIA), and cerebral infarction without residual deficits: Secondary | ICD-10-CM

## 2023-06-14 DIAGNOSIS — E785 Hyperlipidemia, unspecified: Secondary | ICD-10-CM

## 2023-06-14 DIAGNOSIS — R419 Unspecified symptoms and signs involving cognitive functions and awareness: Secondary | ICD-10-CM

## 2023-06-14 DIAGNOSIS — G300 Alzheimer's disease with early onset: Secondary | ICD-10-CM | POA: Diagnosis not present

## 2023-06-14 NOTE — Progress Notes (Signed)
Location:   [facility] Room Number: 4-A Place of Service:  SNF [phone]) Provider:  Hazle Nordmann, NP  PCP: Mahlon Gammon, MD  Patient Care Team: Mahlon Gammon, MD as PCP - General (Internal Medicine) [name], DO as Consulting Physician (Neurology)  Extended Emergency Contact Information Primary Emergency Contact: [name],[name] Address: [address]          [place] [address] [name] Home Phone: [phone] Relation: Sister Secondary Emergency Contact: [facility] Address: [address] [place], [place] [address] [name] Mobile Phone: [phone] Relation: Brother  Code Status:  DNR Goals of care: Advanced Directive information    06/14/2023   10:05 AM  Advanced Directives  Does Patient Have a Medical Advance Directive? Yes  Type of Estate agent of [place];Out of facility DNR (pink MOST or yellow form)  Does patient want to make changes to medical advance directive? No - Patient declined  Copy of Healthcare Power of Attorney in Chart? Yes - validated most recent copy scanned in chart (See row information)     Chief Complaint  Patient presents with   Medical Management of Chronic Issues    Routine Visit.    Health Maintenance    Discuss the need for Diabetic kidney evaluation, and AWV visit.     Immunizations    Discuss the need for Influenza vaccine.     HPI:  Pt is a 82 y.o. female seen today for medical management of chronic diseases.    She currently resides on the skilled nursing unit at [facility]. Past medical history includes: HTN, T2DM, constipation, neurocognitive disorder, osteoporosis, dyslipidemia, hx of lung nodule, lung nodule, and abnormal gait.   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, on right buttock, foam dressing QOD, remains on zinc oxide to pressure points Left forearm skin tear - noted 07/15, 2 steri strips intact, foam dressing prn Neurocognitive disorder-  dependent with ADLs except feeding, no behaviors, nonverbal, hoyer transfer, not on medication T2DM-  A1c 8.2 (06/07)> was 6.4 (12/05), no recent hypoglycemic events, blood sugars averaging 140-190's, remains on metformin, regular diet HLD- LDL 86 06/23/2022, remains on Lipitor Hx of TIA- remains on Plavix and statin Dysphagia- no recent aspirations, followed by ST, remains on regular foods with nectar thick liquids  No recent falls.   Recent blood pressures:  07/30- 108/68  07/23- 116/65  07/16- 109/66  Recent weights:  07/04- 131.4 lbs  06/01- 133.7 lbs  05/05- 131.7 lbs    Past Medical History:  Diagnosis Date   Diabetes mellitus    Dyslipidemia    Hypertension    Osteoporosis    Urinary incontinence    History reviewed. No pertinent surgical history.  Allergies  Allergen Reactions   Augmentin [Amoxicillin-Pot Clavulanate] Hives   Evista [Raloxifene]    Meloxicam     Unknown reaction   Scallops [Shellfish Allergy] Other (See Comments)    "I don't feel so good after an hour"   Toviaz [Fesoterodine Fumarate Er]     Unknown reaction    Allergies as of 06/14/2023       Reactions   Augmentin [amoxicillin-pot Clavulanate] Hives   Evista [raloxifene]    Meloxicam    Unknown reaction   Scallops [shellfish Allergy] Other (See Comments)   "I don't feel so good after an hour"   Toviaz [fesoterodine Fumarate Er]    Unknown reaction  Medication List        Accurate as of June 14, 2023 10:05 AM. If you have any questions, ask your nurse or doctor.          STOP taking these medications    feeding supplement (PRO-STAT SUGAR FREE 64) Liqd Stopped by: Eniya Cannady E Andersen Iorio       TAKE these medications    acetaminophen 325 MG tablet Commonly known as: TYLENOL Take 650 mg by mouth 2 (two) times daily.   ARTIFICIAL TEAR OP Place 1 drop into both eyes in the morning, at noon, in the evening, and at bedtime.   atorvastatin 40 MG tablet Commonly known as:  LIPITOR Take 1 tablet (40 mg total) by mouth daily.   Caltrate 600+D3 Soft 600-20 MG-MCG Chew Generic drug: Calcium Carb-Cholecalciferol Chew 1 tablet by mouth in the morning and at bedtime.   cholecalciferol 25 MCG (1000 UNIT) tablet Commonly known as: VITAMIN D3 Take 1,000 Units by mouth daily.   clopidogrel 75 MG tablet Commonly known as: PLAVIX Take 75 mg by mouth daily.   melatonin 3 MG Tabs tablet Take 3 mg by mouth at bedtime.   metFORMIN 500 MG tablet Commonly known as: GLUCOPHAGE Take 500 mg by mouth daily with breakfast.   METFORMIN HCL PO Take 250 mg by mouth every evening.   sennosides-docusate sodium 8.6-50 MG tablet Commonly known as: SENOKOT-S Take 1 tablet by mouth in the morning and at bedtime. Hold for loose stools   zinc oxide 20 % ointment Apply 1 application topically as needed for irritation.        Review of Systems  Unable to perform ROS: Patient nonverbal    Immunization History  Administered Date(s) Administered   Fluad Quad(high Dose 65+) 09/02/2022   Influenza Inj Mdck Quad Pf 08/10/2019   Influenza-Unspecified 08/13/2020, 09/02/2021   Moderna Covid-19 Vaccine Bivalent Booster 59yrs & up 04/22/2022   Moderna SARS-COV2 Booster Vaccination 04/16/2021   Moderna Sars-Covid-2 Vaccination 12/11/2019, 01/08/2020, 04/22/2022   PNEUMOCOCCAL CONJUGATE-20 01/08/2022   Tdap 01/07/2022   Unspecified SARS-COV-2 Vaccination 09/23/2020, 07/30/2021, 09/15/2022   Zoster Recombinant(Shingrix) 07/01/2020   Zoster, Live 07/01/2020   Pertinent  Health Maintenance Due  Topic Date Due   INFLUENZA VACCINE  06/10/2023   HEMOGLOBIN A1C  10/16/2023   OPHTHALMOLOGY EXAM  04/11/2024   FOOT EXAM  05/09/2024   DEXA SCAN  Completed      01/22/2023    4:41 PM 01/25/2023   11:07 AM 03/11/2023    9:59 AM 04/12/2023   11:10 AM 04/16/2023    2:26 PM  Fall Risk  Falls in the past year? 1 1 1 1  0  Was there an injury with Fall? 0 0 0 0 0  Fall Risk Category  Calculator 1 1 1 1  0  Patient at Risk for Falls Due to History of fall(s);Impaired mobility;Impaired balance/gait History of fall(s);Impaired balance/gait;Mental status change;Impaired mobility History of fall(s) History of fall(s);Impaired balance/gait;Impaired mobility History of fall(s);Impaired balance/gait;Impaired mobility  Fall risk Follow up Falls evaluation completed;Education provided;Falls prevention discussed Falls evaluation completed;Education provided;Falls prevention discussed Falls evaluation completed Falls evaluation completed;Education provided;Falls prevention discussed Falls evaluation completed;Education provided;Falls prevention discussed   Functional Status Survey:    Vitals:   06/14/23 1002  BP: 108/68  Pulse: 83  Resp: 20  Temp: 98.1 F (36.7 C)  SpO2: 97%  Weight: 134 lb 6.4 oz (61 kg)  Height: 5\' 3"  (1.6 m)   Body mass index is 23.81 kg/m. Physical Exam Vitals  reviewed.  Constitutional:      General: She is not in acute distress. HENT:     Head: Normocephalic.     Right Ear: There is no impacted cerumen.     Left Ear: There is no impacted cerumen.     Nose: Nose normal.     Mouth/Throat:     Mouth: Mucous membranes are moist.  Eyes:     General:        Right eye: No discharge.        Left eye: No discharge.  Cardiovascular:     Rate and Rhythm: Normal rate and regular rhythm.     Pulses:          Dorsalis pedis pulses are 1+ on the right side and 1+ on the left side.       Posterior tibial pulses are 1+ on the right side and 1+ on the left side.     Heart sounds: Normal heart sounds.  Pulmonary:     Effort: Pulmonary effort is normal. No respiratory distress.     Breath sounds: Normal breath sounds. No wheezing.  Abdominal:     General: Bowel sounds are normal. There is no distension.     Palpations: Abdomen is soft.     Tenderness: There is no abdominal tenderness.  Musculoskeletal:     Cervical back: Neck supple.     Right lower leg: No  edema.     Left lower leg: No edema.  Feet:     Right foot:     Skin integrity: Skin integrity normal.     Toenail Condition: Right toenails are normal.     Left foot:     Skin integrity: Skin integrity normal.     Toenail Condition: Left toenails are normal.  Skin:    General: Skin is warm.     Comments: Approx 1 cm x 1.5 cm stage II PU to right buttocks, CDI, surrounding skin blanchable. Approx 1 cm skin tear to left anterior forearm, 2 steri strips intact, CDI, surrounding skin intact.   Neurological:     General: No focal deficit present.     Mental Status: She is alert. Mental status is at baseline.     Motor: Weakness present.     Gait: Gait abnormal.     Comments: hoyer  Psychiatric:        Mood and Affect: Mood normal.     Comments: Nonverbal, follows some commands     Labs reviewed: Recent Labs    08/13/22 0000 10/13/22 0000 01/22/23 0000  NA 143 139 139  K  --  4.0 4.1  CL 105 104 104  CO2 29* 25* 26*  BUN 26* 18 27*  CREATININE 0.8 0.8 0.9  CALCIUM 9.6 9.4 9.6   Recent Labs    08/13/22 0000 10/13/22 0000  AST 14 13  ALT 10 11  ALKPHOS 45 47  ALBUMIN 3.9 3.5   Recent Labs    08/13/22 0000 10/13/22 0000 01/22/23 0000  WBC 5.4 5.2 11.0  NEUTROABS 2,857.00  --  8,217.00  HGB 13.5 11.9* 12.8  HCT 41 35* 37  PLT  --  261 258   Lab Results  Component Value Date   TSH 1.14 10/16/2021   Lab Results  Component Value Date   HGBA1C 8.2 04/16/2023   Lab Results  Component Value Date   CHOL 147 06/23/2022   HDL 36 06/23/2022   LDLCALC 86 06/23/2022   TRIG 150 06/23/2022  CHOLHDL 4.5 08/20/2019    Significant Diagnostic Results in last 30 days:  No results found.  Assessment/Plan 1. Pressure injury of right buttock, stage 2 (HCC) - noted 07/30 - sedentary, incontinence - cont foam dressing every other day - cont zinc oxide cream  2. Skin tear of left forearm without complication, initial encounter - noted 07/15 - off Prostat - no  sign of infection - 2 steri strips> almost healed - cont foam dressing changes prn  3. Neurocognitive disorder - no behaviors - dependent with ADLs - mostly nonverbal - not on medication  - weights stable - cont skilled nursing  4. Type 2 diabetes mellitus with other neurologic complication, without long-term current use of insulin (HCC) - A1c stable - no hypoglycemias - sugars 140-190's - cont metformin  5. Hyperlipidemia associated with type 2 diabetes mellitus (HCC) - LDL 86 06/2022 - cont atorvastatin  6. History of TIA (transient ischemic attack) - cont Plavix and atorvastatin  7. Oropharyngeal dysphagia - followed by ST - no recent episodes - cont regular diet, nectar fluids   Family/ staff Communication: plan discussed with patient and nurse  Labs/tests ordered:  none

## 2023-06-15 DIAGNOSIS — R1312 Dysphagia, oropharyngeal phase: Secondary | ICD-10-CM | POA: Diagnosis not present

## 2023-06-15 DIAGNOSIS — G300 Alzheimer's disease with early onset: Secondary | ICD-10-CM | POA: Diagnosis not present

## 2023-06-17 DIAGNOSIS — G300 Alzheimer's disease with early onset: Secondary | ICD-10-CM | POA: Diagnosis not present

## 2023-06-17 DIAGNOSIS — R1312 Dysphagia, oropharyngeal phase: Secondary | ICD-10-CM | POA: Diagnosis not present

## 2023-06-18 DIAGNOSIS — G300 Alzheimer's disease with early onset: Secondary | ICD-10-CM | POA: Diagnosis not present

## 2023-06-18 DIAGNOSIS — R1312 Dysphagia, oropharyngeal phase: Secondary | ICD-10-CM | POA: Diagnosis not present

## 2023-06-21 DIAGNOSIS — G300 Alzheimer's disease with early onset: Secondary | ICD-10-CM | POA: Diagnosis not present

## 2023-06-21 DIAGNOSIS — R1312 Dysphagia, oropharyngeal phase: Secondary | ICD-10-CM | POA: Diagnosis not present

## 2023-06-22 DIAGNOSIS — R1312 Dysphagia, oropharyngeal phase: Secondary | ICD-10-CM | POA: Diagnosis not present

## 2023-06-22 DIAGNOSIS — G300 Alzheimer's disease with early onset: Secondary | ICD-10-CM | POA: Diagnosis not present

## 2023-06-23 DIAGNOSIS — R1312 Dysphagia, oropharyngeal phase: Secondary | ICD-10-CM | POA: Diagnosis not present

## 2023-06-23 DIAGNOSIS — G300 Alzheimer's disease with early onset: Secondary | ICD-10-CM | POA: Diagnosis not present

## 2023-06-25 DIAGNOSIS — R1312 Dysphagia, oropharyngeal phase: Secondary | ICD-10-CM | POA: Diagnosis not present

## 2023-06-25 DIAGNOSIS — G300 Alzheimer's disease with early onset: Secondary | ICD-10-CM | POA: Diagnosis not present

## 2023-06-28 DIAGNOSIS — R1312 Dysphagia, oropharyngeal phase: Secondary | ICD-10-CM | POA: Diagnosis not present

## 2023-06-28 DIAGNOSIS — G300 Alzheimer's disease with early onset: Secondary | ICD-10-CM | POA: Diagnosis not present

## 2023-06-29 DIAGNOSIS — G300 Alzheimer's disease with early onset: Secondary | ICD-10-CM | POA: Diagnosis not present

## 2023-06-29 DIAGNOSIS — R1312 Dysphagia, oropharyngeal phase: Secondary | ICD-10-CM | POA: Diagnosis not present

## 2023-06-30 DIAGNOSIS — G300 Alzheimer's disease with early onset: Secondary | ICD-10-CM | POA: Diagnosis not present

## 2023-06-30 DIAGNOSIS — R1312 Dysphagia, oropharyngeal phase: Secondary | ICD-10-CM | POA: Diagnosis not present

## 2023-07-01 DIAGNOSIS — R1312 Dysphagia, oropharyngeal phase: Secondary | ICD-10-CM | POA: Diagnosis not present

## 2023-07-01 DIAGNOSIS — G300 Alzheimer's disease with early onset: Secondary | ICD-10-CM | POA: Diagnosis not present

## 2023-07-02 DIAGNOSIS — G300 Alzheimer's disease with early onset: Secondary | ICD-10-CM | POA: Diagnosis not present

## 2023-07-02 DIAGNOSIS — R1312 Dysphagia, oropharyngeal phase: Secondary | ICD-10-CM | POA: Diagnosis not present

## 2023-07-02 DIAGNOSIS — R279 Unspecified lack of coordination: Secondary | ICD-10-CM | POA: Diagnosis not present

## 2023-07-02 DIAGNOSIS — M6281 Muscle weakness (generalized): Secondary | ICD-10-CM | POA: Diagnosis not present

## 2023-07-02 DIAGNOSIS — R4701 Aphasia: Secondary | ICD-10-CM | POA: Diagnosis not present

## 2023-07-05 DIAGNOSIS — G300 Alzheimer's disease with early onset: Secondary | ICD-10-CM | POA: Diagnosis not present

## 2023-07-05 DIAGNOSIS — R1312 Dysphagia, oropharyngeal phase: Secondary | ICD-10-CM | POA: Diagnosis not present

## 2023-07-06 DIAGNOSIS — R279 Unspecified lack of coordination: Secondary | ICD-10-CM | POA: Diagnosis not present

## 2023-07-06 DIAGNOSIS — M6281 Muscle weakness (generalized): Secondary | ICD-10-CM | POA: Diagnosis not present

## 2023-07-06 DIAGNOSIS — G300 Alzheimer's disease with early onset: Secondary | ICD-10-CM | POA: Diagnosis not present

## 2023-07-06 DIAGNOSIS — R4701 Aphasia: Secondary | ICD-10-CM | POA: Diagnosis not present

## 2023-07-06 DIAGNOSIS — R1312 Dysphagia, oropharyngeal phase: Secondary | ICD-10-CM | POA: Diagnosis not present

## 2023-07-07 ENCOUNTER — Encounter: Payer: Self-pay | Admitting: Orthopedic Surgery

## 2023-07-07 ENCOUNTER — Non-Acute Institutional Stay (INDEPENDENT_AMBULATORY_CARE_PROVIDER_SITE_OTHER): Payer: Medicare Other | Admitting: Orthopedic Surgery

## 2023-07-07 DIAGNOSIS — Z0000 Encounter for general adult medical examination without abnormal findings: Secondary | ICD-10-CM

## 2023-07-07 DIAGNOSIS — M6281 Muscle weakness (generalized): Secondary | ICD-10-CM | POA: Diagnosis not present

## 2023-07-07 DIAGNOSIS — R4701 Aphasia: Secondary | ICD-10-CM | POA: Diagnosis not present

## 2023-07-07 DIAGNOSIS — R279 Unspecified lack of coordination: Secondary | ICD-10-CM | POA: Diagnosis not present

## 2023-07-07 NOTE — Patient Instructions (Signed)
  Jaclyn Guzman , Thank you for taking time to come for your Medicare Wellness Visit. I appreciate your ongoing commitment to your health goals. Please review the following plan we discussed and let me know if I can assist you in the future.   These are the goals we discussed:  Goals      DIET - INCREASE WATER INTAKE     Maintain Mobility and Function     Evidence-based guidance:  Emphasize the importance of physical activity and aerobic exercise as included in treatment plan; assess barriers to adherence; consider patient's abilities and preferences.  Encourage gradual increase in activity or exercise instead of stopping if pain occurs.  Reinforce individual therapy exercise prescription, such as strengthening, stabilization and stretching programs.  Promote optimal body mechanics to stabilize the spine with lifting and functional activity.  Encourage activity and mobility modifications to facilitate optimal function, such as using a log roll for bed mobility or dressing from a seated position.  Reinforce individual adaptive equipment recommendations to limit excessive spinal movements, such as a Event organiser.  Assess adequacy of sleep; encourage use of sleep hygiene techniques, such as bedtime routine; use of white noise; dark, cool bedroom; avoiding daytime naps, heavy meals or exercise before bedtime.  Promote positions and modification to optimize sleep and sexual activity; consider pillows or positioning devices to assist in maintaining neutral spine.  Explore options for applying ergonomic principles at work and home, such as frequent position changes, using ergonomically designed equipment and working at optimal height.  Promote modifications to increase comfort with driving such as lumbar support, optimizing seat and steering wheel position, using cruise control and taking frequent rest stops to stretch and walk.   Notes:         This is a list of the screening recommended for you  and due dates:  Health Maintenance  Topic Date Due   Yearly kidney health urinalysis for diabetes  Never done   Flu Shot  06/10/2023   Zoster (Shingles) Vaccine (2 of 2) 08/10/2023*   Hemoglobin A1C  10/16/2023   Yearly kidney function blood test for diabetes  01/22/2024   Eye exam for diabetics  04/11/2024   Complete foot exam   06/13/2024   Medicare Annual Wellness Visit  07/06/2024   DTaP/Tdap/Td vaccine (2 - Td or Tdap) 01/08/2032   Pneumonia Vaccine  Completed   DEXA scan (bone density measurement)  Completed   HPV Vaccine  Aged Out   COVID-19 Vaccine  Discontinued  *Topic was postponed. The date shown is not the original due date.

## 2023-07-07 NOTE — Progress Notes (Signed)
Subjective:   Jaclyn Guzman is a 82 y.o. female who presents for Medicare Annual (Subsequent) preventive examination.  Visit Complete: In person  Patient Medicare AWV questionnaire was completed by the patient on 07/07/2023; I have confirmed that all information answered by patient is correct and no changes since this date.  Review of Systems     Cardiac Risk Factors include: advanced age (>65men, >106 women);diabetes mellitus;sedentary lifestyle;hypertension;dyslipidemia     Objective:    Today's Vitals   07/07/23 1116  BP: 135/78  Pulse: 78  Resp: 20  Temp: 98 F (36.7 C)  SpO2: 94%  Weight: 131 lb 12.8 oz (59.8 kg)  Height: 5\' 3"  (1.6 m)   Body mass index is 23.35 kg/m.     06/14/2023   10:05 AM 05/10/2023    9:51 AM 03/11/2023   10:00 AM 02/08/2023   10:05 AM 01/25/2023   10:56 AM 01/12/2023    3:13 PM 12/17/2022    1:51 PM  Advanced Directives  Does Patient Have a Medical Advance Directive? Yes Yes Yes Yes Yes Yes Yes  Type of Estate agent of [place];Out of facility DNR (pink MOST or yellow form) Healthcare Power of [place];Out of facility DNR (pink MOST or yellow form) Healthcare Power of [place];Out of facility DNR (pink MOST or yellow form) Healthcare Power of [place];Out of facility DNR (pink MOST or yellow form) Healthcare Power of [place];Out of facility DNR (pink MOST or yellow form) Healthcare Power of [place];Out of facility DNR (pink MOST or yellow form);Living will Healthcare Power of [place];Living will;Out of facility DNR (pink MOST or yellow form)  Does patient want to make changes to medical advance directive? No - Patient declined No - Patient declined No - Patient declined No - Patient declined No - Patient declined No - Patient declined No - Patient declined  Copy of Healthcare Power of Attorney in Chart? Yes - validated most recent copy scanned in chart (See row information) Yes - validated most recent copy scanned in chart (See  row information) Yes - validated most recent copy scanned in chart (See row information) Yes - validated most recent copy scanned in chart (See row information) Yes - validated most recent copy scanned in chart (See row information) Yes - validated most recent copy scanned in chart (See row information) Yes - validated most recent copy scanned in chart (See row information)  Pre-existing out of facility DNR order (yellow form or pink MOST form)       Pink MOST form placed in chart (order not valid for inpatient use)    Current Medications (verified) Outpatient Encounter Medications as of 07/07/2023  Medication Sig   acetaminophen (TYLENOL) 325 MG tablet Take 650 mg by mouth 2 (two) times daily.   ARTIFICIAL TEAR OP Place 1 drop into both eyes in the morning, at noon, in the evening, and at bedtime.   atorvastatin (LIPITOR) 40 MG tablet Take 1 tablet (40 mg total) by mouth daily.   Calcium Carb-Cholecalciferol (CALTRATE 600+D3 SOFT) 600-20 MG-MCG CHEW Chew 1 tablet by mouth in the morning and at bedtime.   cholecalciferol (VITAMIN D3) 25 MCG (1000 UNIT) tablet Take 1,000 Units by mouth daily.   clopidogrel (PLAVIX) 75 MG tablet Take 75 mg by mouth daily.   melatonin 3 MG TABS tablet Take 3 mg by mouth at bedtime.   metFORMIN (GLUCOPHAGE) 500 MG tablet Take 500 mg by mouth daily with breakfast.   METFORMIN HCL PO Take 250 mg by mouth  every evening.   sennosides-docusate sodium (SENOKOT-S) 8.6-50 MG tablet Take 1 tablet by mouth in the morning and at bedtime. Hold for loose stools   zinc oxide 20 % ointment Apply 1 application topically as needed for irritation.   No facility-administered encounter medications on file as of 07/07/2023.    Allergies (verified) Augmentin [amoxicillin-pot clavulanate], Evista [raloxifene], Meloxicam, Scallops [shellfish allergy], and Toviaz [fesoterodine fumarate er]   History: Past Medical History:  Diagnosis Date   Diabetes mellitus    Dyslipidemia     Hypertension    Osteoporosis    Urinary incontinence    No past surgical history on file. Family History  Problem Relation Age of Onset   Fragile X syndrome Father    Healthy Sister    Breast cancer Neg Hx    Stroke Neg Hx    Social History   Socioeconomic History   Marital status: Single    Spouse name: Not on file   Number of children: 0   Years of education: Not on file   Highest education level: Master's degree (e.g., MA, MS, MEng, MEd, MSW, MBA)  Occupational History   Occupation: retired  Tobacco Use   Smoking status: Never   Smokeless tobacco: Never  Vaping Use   Vaping status: Never Used  Substance and Sexual Activity   Alcohol use: No   Drug use: No   Sexual activity: Not on file  Other Topics Concern   Not on file  Social History Narrative   Social Review      Patient lives at home alone   Pt lives in a one or two story home?one story home   Private home   What is patient highest level of education?Masters Degree   Is patient RIGHT or LEFT handed? RIGHT HANDED   Does patient drink coffee, tea, soda? YES    How much coffee, tea, soda does patient drink? SODA only at lunch, sometimes 1-2 times a week   Does patient exercise regularly? NO   Social Determinants of Health   Financial Resource Strain: Patient Unable To Answer (07/07/2023)   Overall Financial Resource Strain (CARDIA)    Difficulty of Paying Living Expenses: Patient unable to answer  Food Insecurity: No Food Insecurity (07/07/2023)   Hunger Vital Sign    Worried About Running Out of Food in the Last Year: Never true    Ran Out of Food in the Last Year: Never true  Transportation Needs: Patient Unable To Answer (07/07/2023)   PRAPARE - Transportation    Lack of Transportation (Medical): Patient unable to answer    Lack of Transportation (Non-Medical): Patient unable to answer  Physical Activity: Patient Unable To Answer (07/07/2023)   Exercise Vital Sign    Days of Exercise per Week: Patient  unable to answer    Minutes of Exercise per Session: Patient unable to answer  Stress: Patient Unable To Answer (07/07/2023)   [employer] of Occupational Health - Occupational Stress Questionnaire    Feeling of Stress : Patient unable to answer  Social Connections: Unknown (07/07/2023)   Social Connection and Isolation Panel [NHANES]    Frequency of Communication with Friends and Family: Never    Frequency of Social Gatherings with Friends and Family: More than three times a week    Attends Religious Services: Not on file    Active Member of Clubs or Organizations: No    Attends Banker Meetings: Never    Marital Status: Never married    Tobacco  Counseling Counseling given: Not Answered   Clinical Intake:  Pre-visit preparation completed: Yes  Pain : Faces Faces Pain Scale: No hurt  Faces Pain Scale: No hurt  BMI - recorded: 23.35 Nutritional Status: BMI of 19-24  Normal Nutritional Risks: None Diabetes: Yes CBG done?: Yes CBG resulted in Enter/ Edit results?: No Did pt. bring in CBG monitor from home?: No  How often do you need to have someone help you when you read instructions, pamphlets, or other written materials from your doctor or pharmacy?: 5 - Always What is the last grade level you completed in school?: Masters degree  Interpreter Needed?: No      Activities of Daily Living    07/07/2023   11:22 AM  In your present state of health, do you have any difficulty performing the following activities:  Hearing? 0  Vision? 0  Difficulty concentrating or making decisions? 1  Walking or climbing stairs? 1  Dressing or bathing? 1  Doing errands, shopping? 1  Preparing Food and eating ? Y  Using the Toilet? Y  In the past six months, have you accidently leaked urine? Y  Do you have problems with loss of bowel control? Y  Managing your Medications? Y  Managing your Finances? Y  Housekeeping or managing your Housekeeping? Y    Patient Care  Team: Mahlon Gammon, MD as PCP - General (Internal Medicine) [name], DO as Consulting Physician (Neurology)  Indicate any recent Medical Services you may have received from other than [facility] providers in the past year (date may be approximate).     Assessment:   This is a routine wellness examination for Dick.  Hearing/Vision screen No results found.  Dietary issues and exercise activities discussed:     Goals Addressed             This Visit's Progress    DIET - INCREASE WATER INTAKE   Not on track    Maintain Mobility and Function   Not on track    Evidence-based guidance:  Emphasize the importance of physical activity and aerobic exercise as included in treatment plan; assess barriers to adherence; consider patient's abilities and preferences.  Encourage gradual increase in activity or exercise instead of stopping if pain occurs.  Reinforce individual therapy exercise prescription, such as strengthening, stabilization and stretching programs.  Promote optimal body mechanics to stabilize the spine with lifting and functional activity.  Encourage activity and mobility modifications to facilitate optimal function, such as using a log roll for bed mobility or dressing from a seated position.  Reinforce individual adaptive equipment recommendations to limit excessive spinal movements, such as a Event organiser.  Assess adequacy of sleep; encourage use of sleep hygiene techniques, such as bedtime routine; use of white noise; dark, cool bedroom; avoiding daytime naps, heavy meals or exercise before bedtime.  Promote positions and modification to optimize sleep and sexual activity; consider pillows or positioning devices to assist in maintaining neutral spine.  Explore options for applying ergonomic principles at work and home, such as frequent position changes, using ergonomically designed equipment and working at optimal height.  Promote modifications to increase  comfort with driving such as lumbar support, optimizing seat and steering wheel position, using cruise control and taking frequent rest stops to stretch and walk.   Notes:        Depression Screen    07/07/2023   11:25 AM 06/14/2023    2:42 PM 04/12/2023   11:10 AM 03/11/2023  9:59 AM 10/09/2022   10:09 AM 07/01/2022    1:59 PM 06/25/2021    4:07 PM  PHQ 2/9 Scores  PHQ - 2 Score    0 0  1  Exception Documentation Medical reason Medical reason Medical reason   Medical reason     Fall Risk    07/07/2023   11:26 AM 06/14/2023    2:43 PM 04/16/2023    2:26 PM 04/12/2023   11:10 AM 03/11/2023    9:59 AM  Fall Risk   Falls in the past year? 0 0 0 1 1  Number falls in past yr: 0 0 0 0 0  Injury with Fall? 0 0 0 0 0  Risk for fall due to : History of fall(s);Impaired balance/gait;Impaired mobility History of fall(s);Impaired balance/gait;Impaired mobility History of fall(s);Impaired balance/gait;Impaired mobility History of fall(s);Impaired balance/gait;Impaired mobility History of fall(s)  Follow up Falls evaluation completed;Education provided;Falls prevention discussed Falls evaluation completed;Education provided;Falls prevention discussed Falls evaluation completed;Education provided;Falls prevention discussed Falls evaluation completed;Education provided;Falls prevention discussed Falls evaluation completed    MEDICARE RISK AT HOME: Medicare Risk at Home Any stairs in or around the home?: No If so, are there any without handrails?: No Home free of loose throw rugs in walkways, pet beds, electrical cords, etc?: Yes Adequate lighting in your home to reduce risk of falls?: Yes Life alert?: No Use of a cane, walker or w/c?: Yes Grab bars in the bathroom?: Yes Shower chair or bench in shower?: Yes Elevated toilet seat or a handicapped toilet?: Yes  TIMED UP AND GO:  Was the test performed?  No    Cognitive Function:    07/07/2023   11:27 AM 07/01/2022    2:04 PM  MMSE - Mini Mental  State Exam  Not completed: Unable to complete Unable to complete      03/22/2020   11:00 AM  Montreal Cognitive Assessment   Visuospatial/ Executive (0/5) 2  Naming (0/3) 3  Attention: Read list of digits (0/2) 2  Attention: Read list of letters (0/1) 0  Attention: Serial 7 subtraction starting at 100 (0/3) 2  Language: Repeat phrase (0/2) 1  Language : Fluency (0/1) 0  Abstraction (0/2) 2  Delayed Recall (0/5) 2  Orientation (0/6) 6  Total 20      07/01/2022    2:04 PM 06/25/2021    4:12 PM  6CIT Screen  What Year? -- 4 points  What month?  3 points  What time?  0 points  Count back from 20  2 points  Months in reverse  2 points  Repeat phrase  2 points  Total Score  13 points    Immunizations Immunization History  Administered Date(s) Administered   Fluad Quad(high Dose 65+) 09/02/2022   Influenza Inj Mdck Quad Pf 08/10/2019   Influenza-Unspecified 08/13/2020, 09/02/2021   Moderna Covid-19 Vaccine Bivalent Booster 78yrs & up 04/22/2022   Moderna SARS-COV2 Booster Vaccination 04/16/2021   Moderna Sars-Covid-2 Vaccination 12/11/2019, 01/08/2020, 04/22/2022   PNEUMOCOCCAL CONJUGATE-20 01/08/2022   Tdap 01/07/2022   Unspecified SARS-COV-2 Vaccination 09/23/2020, 07/30/2021, 09/15/2022   Zoster Recombinant(Shingrix) 07/01/2020   Zoster, Live 07/01/2020    TDAP status: Up to date  Flu Vaccine status: Due, Education has been provided regarding the importance of this vaccine. Advised may receive this vaccine at local pharmacy or Health Dept. Aware to provide a copy of the vaccination record if obtained from local pharmacy or Health Dept. Verbalized acceptance and understanding.  Pneumococcal vaccine status: Up to date  Covid-19 vaccine status: Completed vaccines  Qualifies for Shingles Vaccine? Yes   Zostavax completed Yes   Shingrix Completed?: Yes  Screening Tests Health Maintenance  Topic Date Due   Diabetic kidney evaluation - Urine ACR  Never done    INFLUENZA VACCINE  06/10/2023   Zoster Vaccines- Shingrix (2 of 2) 08/10/2023 (Originally 08/26/2020)   HEMOGLOBIN A1C  10/16/2023   Diabetic kidney evaluation - eGFR measurement  01/22/2024   OPHTHALMOLOGY EXAM  04/11/2024   FOOT EXAM  06/13/2024   Medicare Annual Wellness (AWV)  07/06/2024   DTaP/Tdap/Td (2 - Td or Tdap) 01/08/2032   Pneumonia Vaccine 21+ Years old  Completed   DEXA SCAN  Completed   HPV VACCINES  Aged Out   COVID-19 Vaccine  Discontinued    Health Maintenance  Health Maintenance Due  Topic Date Due   Diabetic kidney evaluation - Urine ACR  Never done   INFLUENZA VACCINE  06/10/2023    Colorectal cancer screening: No longer required.   Mammogram status: No longer required due to advanced age, neurocognitive disorder.  Bone Density status: Completed 2001. Results reflect: Bone density results: OSTEOPENIA. Repeat every 2> refused due to goals of care years.  Lung Cancer Screening: (Low Dose CT Chest recommended if Age 7-80 years, 20 pack-year currently smoking OR have quit w/in 15years.) does not qualify.   Lung Cancer Screening Referral: No  Additional Screening:  Hepatitis C Screening: does not qualify; Completed   Vision Screening: Recommended annual ophthalmology exams for early detection of glaucoma and other disorders of the eye. Is the patient up to date with their annual eye exam?  Yes  Who is the provider or what is the name of the office in which the patient attends annual eye exams? [facility]> in house provider If pt is not established with a provider, would they like to be referred to a provider to establish care? No .   Dental Screening: Recommended annual dental exams for proper oral hygiene  Diabetic Foot Exam: Diabetic Foot Exam: Completed 07/06/2023  Community Resource Referral / Chronic Care Management: CRR required this visit?  No   CCM required this visit?  No     Plan:     I have personally reviewed and noted the  following in the patient's chart:   Medical and social history Use of alcohol, tobacco or illicit drugs  Current medications and supplements including opioid prescriptions. Patient is not currently taking opioid prescriptions. Functional ability and status Nutritional status Physical activity Advanced directives List of other physicians Hospitalizations, surgeries, and ER visits in previous 12 months Vitals Screenings to include cognitive, depression, and falls Referrals and appointments  In addition, I have reviewed and discussed with patient certain preventive protocols, quality metrics, and best practice recommendations. A written personalized care plan for preventive services as well as general preventive health recommendations were provided to patient.     Octavia Heir, NP   07/07/2023   After Visit Summary: (MyChart) Due to this being a telephonic visit, the after visit summary with patients personalized plan was offered to patient via MyChart   Nurse Notes: flu and covid vaccine to be given this fall per [facility] nursing

## 2023-07-08 DIAGNOSIS — M6281 Muscle weakness (generalized): Secondary | ICD-10-CM | POA: Diagnosis not present

## 2023-07-08 DIAGNOSIS — G300 Alzheimer's disease with early onset: Secondary | ICD-10-CM | POA: Diagnosis not present

## 2023-07-08 DIAGNOSIS — R4701 Aphasia: Secondary | ICD-10-CM | POA: Diagnosis not present

## 2023-07-08 DIAGNOSIS — R279 Unspecified lack of coordination: Secondary | ICD-10-CM | POA: Diagnosis not present

## 2023-07-08 DIAGNOSIS — R1312 Dysphagia, oropharyngeal phase: Secondary | ICD-10-CM | POA: Diagnosis not present

## 2023-07-09 DIAGNOSIS — R1312 Dysphagia, oropharyngeal phase: Secondary | ICD-10-CM | POA: Diagnosis not present

## 2023-07-09 DIAGNOSIS — G300 Alzheimer's disease with early onset: Secondary | ICD-10-CM | POA: Diagnosis not present

## 2023-07-13 DIAGNOSIS — G300 Alzheimer's disease with early onset: Secondary | ICD-10-CM | POA: Diagnosis not present

## 2023-07-13 DIAGNOSIS — M6281 Muscle weakness (generalized): Secondary | ICD-10-CM | POA: Diagnosis not present

## 2023-07-13 DIAGNOSIS — R1312 Dysphagia, oropharyngeal phase: Secondary | ICD-10-CM | POA: Diagnosis not present

## 2023-07-13 DIAGNOSIS — R279 Unspecified lack of coordination: Secondary | ICD-10-CM | POA: Diagnosis not present

## 2023-07-13 DIAGNOSIS — R4701 Aphasia: Secondary | ICD-10-CM | POA: Diagnosis not present

## 2023-07-14 DIAGNOSIS — G300 Alzheimer's disease with early onset: Secondary | ICD-10-CM | POA: Diagnosis not present

## 2023-07-14 DIAGNOSIS — M6281 Muscle weakness (generalized): Secondary | ICD-10-CM | POA: Diagnosis not present

## 2023-07-14 DIAGNOSIS — R1312 Dysphagia, oropharyngeal phase: Secondary | ICD-10-CM | POA: Diagnosis not present

## 2023-07-14 DIAGNOSIS — R279 Unspecified lack of coordination: Secondary | ICD-10-CM | POA: Diagnosis not present

## 2023-07-14 DIAGNOSIS — R4701 Aphasia: Secondary | ICD-10-CM | POA: Diagnosis not present

## 2023-07-15 DIAGNOSIS — R1312 Dysphagia, oropharyngeal phase: Secondary | ICD-10-CM | POA: Diagnosis not present

## 2023-07-15 DIAGNOSIS — G300 Alzheimer's disease with early onset: Secondary | ICD-10-CM | POA: Diagnosis not present

## 2023-07-15 DIAGNOSIS — M6281 Muscle weakness (generalized): Secondary | ICD-10-CM | POA: Diagnosis not present

## 2023-07-15 DIAGNOSIS — R279 Unspecified lack of coordination: Secondary | ICD-10-CM | POA: Diagnosis not present

## 2023-07-15 DIAGNOSIS — R4701 Aphasia: Secondary | ICD-10-CM | POA: Diagnosis not present

## 2023-07-16 DIAGNOSIS — R1312 Dysphagia, oropharyngeal phase: Secondary | ICD-10-CM | POA: Diagnosis not present

## 2023-07-16 DIAGNOSIS — G300 Alzheimer's disease with early onset: Secondary | ICD-10-CM | POA: Diagnosis not present

## 2023-07-19 DIAGNOSIS — R4701 Aphasia: Secondary | ICD-10-CM | POA: Diagnosis not present

## 2023-07-19 DIAGNOSIS — G300 Alzheimer's disease with early onset: Secondary | ICD-10-CM | POA: Diagnosis not present

## 2023-07-19 DIAGNOSIS — R279 Unspecified lack of coordination: Secondary | ICD-10-CM | POA: Diagnosis not present

## 2023-07-19 DIAGNOSIS — R1312 Dysphagia, oropharyngeal phase: Secondary | ICD-10-CM | POA: Diagnosis not present

## 2023-07-19 DIAGNOSIS — M6281 Muscle weakness (generalized): Secondary | ICD-10-CM | POA: Diagnosis not present

## 2023-07-20 DIAGNOSIS — R1312 Dysphagia, oropharyngeal phase: Secondary | ICD-10-CM | POA: Diagnosis not present

## 2023-07-20 DIAGNOSIS — G300 Alzheimer's disease with early onset: Secondary | ICD-10-CM | POA: Diagnosis not present

## 2023-07-21 DIAGNOSIS — R279 Unspecified lack of coordination: Secondary | ICD-10-CM | POA: Diagnosis not present

## 2023-07-21 DIAGNOSIS — M6281 Muscle weakness (generalized): Secondary | ICD-10-CM | POA: Diagnosis not present

## 2023-07-21 DIAGNOSIS — R4701 Aphasia: Secondary | ICD-10-CM | POA: Diagnosis not present

## 2023-07-22 DIAGNOSIS — R1312 Dysphagia, oropharyngeal phase: Secondary | ICD-10-CM | POA: Diagnosis not present

## 2023-07-22 DIAGNOSIS — G300 Alzheimer's disease with early onset: Secondary | ICD-10-CM | POA: Diagnosis not present

## 2023-07-23 DIAGNOSIS — M6281 Muscle weakness (generalized): Secondary | ICD-10-CM | POA: Diagnosis not present

## 2023-07-23 DIAGNOSIS — R279 Unspecified lack of coordination: Secondary | ICD-10-CM | POA: Diagnosis not present

## 2023-07-23 DIAGNOSIS — G300 Alzheimer's disease with early onset: Secondary | ICD-10-CM | POA: Diagnosis not present

## 2023-07-23 DIAGNOSIS — R4701 Aphasia: Secondary | ICD-10-CM | POA: Diagnosis not present

## 2023-07-23 DIAGNOSIS — R1312 Dysphagia, oropharyngeal phase: Secondary | ICD-10-CM | POA: Diagnosis not present

## 2023-07-26 DIAGNOSIS — R4701 Aphasia: Secondary | ICD-10-CM | POA: Diagnosis not present

## 2023-07-26 DIAGNOSIS — G300 Alzheimer's disease with early onset: Secondary | ICD-10-CM | POA: Diagnosis not present

## 2023-07-26 DIAGNOSIS — R1312 Dysphagia, oropharyngeal phase: Secondary | ICD-10-CM | POA: Diagnosis not present

## 2023-07-26 DIAGNOSIS — R279 Unspecified lack of coordination: Secondary | ICD-10-CM | POA: Diagnosis not present

## 2023-07-26 DIAGNOSIS — M6281 Muscle weakness (generalized): Secondary | ICD-10-CM | POA: Diagnosis not present

## 2023-07-27 DIAGNOSIS — R1312 Dysphagia, oropharyngeal phase: Secondary | ICD-10-CM | POA: Diagnosis not present

## 2023-07-27 DIAGNOSIS — G300 Alzheimer's disease with early onset: Secondary | ICD-10-CM | POA: Diagnosis not present

## 2023-07-28 DIAGNOSIS — M6281 Muscle weakness (generalized): Secondary | ICD-10-CM | POA: Diagnosis not present

## 2023-07-28 DIAGNOSIS — R279 Unspecified lack of coordination: Secondary | ICD-10-CM | POA: Diagnosis not present

## 2023-07-28 DIAGNOSIS — R1312 Dysphagia, oropharyngeal phase: Secondary | ICD-10-CM | POA: Diagnosis not present

## 2023-07-28 DIAGNOSIS — G300 Alzheimer's disease with early onset: Secondary | ICD-10-CM | POA: Diagnosis not present

## 2023-07-28 DIAGNOSIS — R4701 Aphasia: Secondary | ICD-10-CM | POA: Diagnosis not present

## 2023-07-29 ENCOUNTER — Encounter: Payer: Self-pay | Admitting: Orthopedic Surgery

## 2023-07-29 ENCOUNTER — Non-Acute Institutional Stay (SKILLED_NURSING_FACILITY): Payer: Medicare Other | Admitting: Orthopedic Surgery

## 2023-07-29 DIAGNOSIS — E1169 Type 2 diabetes mellitus with other specified complication: Secondary | ICD-10-CM | POA: Diagnosis not present

## 2023-07-29 DIAGNOSIS — E1149 Type 2 diabetes mellitus with other diabetic neurological complication: Secondary | ICD-10-CM

## 2023-07-29 DIAGNOSIS — Z8673 Personal history of transient ischemic attack (TIA), and cerebral infarction without residual deficits: Secondary | ICD-10-CM

## 2023-07-29 DIAGNOSIS — L89312 Pressure ulcer of right buttock, stage 2: Secondary | ICD-10-CM | POA: Diagnosis not present

## 2023-07-29 DIAGNOSIS — R419 Unspecified symptoms and signs involving cognitive functions and awareness: Secondary | ICD-10-CM | POA: Diagnosis not present

## 2023-07-29 DIAGNOSIS — E785 Hyperlipidemia, unspecified: Secondary | ICD-10-CM

## 2023-07-29 DIAGNOSIS — R1312 Dysphagia, oropharyngeal phase: Secondary | ICD-10-CM

## 2023-07-29 NOTE — Progress Notes (Signed)
Location:  [facility] Room Number: N04-A Place of Service:  SNF [phone]) Provider:  Hazle Nordmann, NP  Mahlon Gammon, MD  Patient Care Team: Mahlon Gammon, MD as PCP - General (Internal Medicine) [name], DO as Consulting Physician (Neurology)  Extended Emergency Contact Information Primary Emergency Contact: [name],[name] Address: [address]          [place] [address] [place] Home Phone: [phone] Relation: Sister Secondary Emergency Contact: [facility] Address: [address] [place], [place] [address] [name] Mobile Phone: [phone] Relation: Brother  Code Status:  DNR Goals of care: Advanced Directive information    07/29/2023    9:00 AM  Advanced Directives  Does Patient Have a Medical Advance Directive? Yes  Type of Estate agent of [place];Out of facility DNR (pink MOST or yellow form);Living will  Does patient want to make changes to medical advance directive? Yes (Inpatient - patient defers changing a medical advance directive at this time - Information given)  Copy of Healthcare Power of Attorney in Chart? Yes - validated most recent copy scanned in chart (See row information)     Chief Complaint  Patient presents with   Medication Management    Routine medication management visit    HPI:  Pt is a 82 y.o. female seen today for medical management of chronic conditions.   She currently resides on the skilled nursing unit at [facility]. Past medical history includes: HTN, T2DM, constipation, neurocognitive disorder, osteoporosis, dyslipidemia, hx of lung nodule, lung nodule, and abnormal gait.    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   Recent blood pressures:  09/17- 108/61  09/10- 106/64  09/03- 108/65  Recent weights:  09/06- 133.5 lbs  08/06- 131.8 lbs  07/04- 131.4 lbs    Past Medical History:  Diagnosis Date   Diabetes mellitus    Dyslipidemia    Hypertension    Osteoporosis    Urinary incontinence    History reviewed. No pertinent surgical history.  Allergies  Allergen Reactions   Augmentin [Amoxicillin-Pot Clavulanate] Hives   Evista [Raloxifene]    Meloxicam     Unknown reaction   Scallops [Shellfish Allergy] Other (See Comments)    "I don't feel so good after an hour"   Toviaz [Fesoterodine Fumarate Er]     Unknown reaction    Outpatient Encounter Medications as of 07/29/2023  Medication Sig   acetaminophen (TYLENOL) 325 MG tablet Take 650 mg by mouth 2 (two) times daily.   ARTIFICIAL TEAR OP Place 1 drop into both eyes in the morning, at noon, in the evening, and at bedtime.   atorvastatin (LIPITOR) 40 MG tablet Take 1 tablet (40 mg total) by mouth daily.   Calcium Carb-Cholecalciferol (CALTRATE 600+D3 SOFT) 600-20 MG-MCG CHEW Chew 1 tablet by mouth  in the morning and at bedtime.   cholecalciferol (VITAMIN D3) 25 MCG (1000 UNIT) tablet Take 1,000 Units by mouth daily.   clopidogrel (PLAVIX) 75 MG tablet Take 75 mg by mouth daily.   melatonin 3 MG TABS tablet Take 3 mg by mouth at bedtime.   metFORMIN (GLUCOPHAGE) 500 MG tablet Take 500 mg by mouth daily with breakfast.   METFORMIN HCL PO Take 250 mg by mouth every evening.   sennosides-docusate sodium  (SENOKOT-S) 8.6-50 MG tablet Take 1 tablet by mouth in the morning and at bedtime. Hold for loose stools   zinc oxide 20 % ointment Apply 1 application topically as needed for irritation.   No facility-administered encounter medications on file as of 07/29/2023.    Review of Systems  Unable to perform ROS: Dementia    Immunization History  Administered Date(s) Administered   Fluad Quad(high Dose 65+) 09/02/2022   Influenza Inj Mdck Quad Pf 08/10/2019   Influenza-Unspecified 08/13/2020, 09/02/2021   Moderna Covid-19 Vaccine Bivalent Booster 52yrs & up 04/22/2022   Moderna SARS-COV2 Booster Vaccination 04/16/2021   Moderna Sars-Covid-2 Vaccination 12/11/2019, 01/08/2020, 04/22/2022   PNEUMOCOCCAL CONJUGATE-20 01/08/2022   Tdap 01/07/2022   Unspecified SARS-COV-2 Vaccination 09/23/2020, 07/30/2021, 09/15/2022   Zoster Recombinant(Shingrix) 07/01/2020   Zoster, Live 07/01/2020   Pertinent  Health Maintenance Due  Topic Date Due   INFLUENZA VACCINE  06/10/2023   HEMOGLOBIN A1C  10/16/2023   OPHTHALMOLOGY EXAM  04/11/2024   FOOT EXAM  06/13/2024   DEXA SCAN  Completed      03/11/2023    9:59 AM 04/12/2023   11:10 AM 04/16/2023    2:26 PM 06/14/2023    2:43 PM 07/07/2023   11:26 AM  Fall Risk  Falls in the past year? 1 1 0 0 0  Was there an injury with Fall? 0 0 0 0 0  Fall Risk Category Calculator 1 1 0 0 0  Patient at Risk for Falls Due to History of fall(s) History of fall(s);Impaired balance/gait;Impaired mobility History of fall(s);Impaired balance/gait;Impaired mobility History of fall(s);Impaired balance/gait;Impaired mobility History of fall(s);Impaired balance/gait;Impaired mobility  Fall risk Follow up Falls evaluation completed Falls evaluation completed;Education provided;Falls prevention discussed Falls evaluation completed;Education provided;Falls prevention discussed Falls evaluation completed;Education provided;Falls prevention discussed Falls evaluation completed;Education  provided;Falls prevention discussed   Functional Status Survey:    Vitals:   07/29/23 0859  BP: 108/61  Pulse: 76  Resp: 20  Temp: (!) 97.3 F (36.3 C)  SpO2: 91%  Weight: 133 lb 8 oz (60.6 kg)  Height: 5\' 3"  (1.6 m)   Body mass index is 23.65 kg/m. Physical Exam Vitals reviewed.  Constitutional:      General: She is not in acute distress. HENT:     Head: Normocephalic.     Right Ear: There is no impacted cerumen.     Left Ear: There is no impacted cerumen.     Nose: Nose normal.     Mouth/Throat:     Mouth: Mucous membranes are moist.  Eyes:     General:        Right eye: No discharge.        Left eye: No discharge.     Pupils: Pupils are equal, round, and reactive to light.  Cardiovascular:     Rate and Rhythm: Normal rate and regular rhythm.     Pulses: Normal pulses.     Heart sounds: Normal heart sounds.  Pulmonary:     Effort: Pulmonary effort is normal. No  respiratory distress.     Breath sounds: Normal breath sounds. No wheezing or rales.  Abdominal:     General: Bowel sounds are normal. There is no distension.     Palpations: Abdomen is soft.     Tenderness: There is no abdominal tenderness.  Musculoskeletal:     Cervical back: Neck supple.     Right lower leg: No edema.     Left lower leg: No edema.  Skin:    General: Skin is warm.     Capillary Refill: Capillary refill takes less than 2 seconds.     Comments: Left FA skin tear healed. Approx 1-2 cm stage II PU to right buttock with granulation tissue to wound bed, no sign of infection, surrounding tissue intact.   Neurological:     General: No focal deficit present.     Mental Status: She is alert. Mental status is at baseline.     Motor: Weakness present.     Gait: Gait abnormal.     Comments: Hoyer transfer, right thumb and index finger contracted, right hand grip 4/5.   Psychiatric:        Mood and Affect: Mood normal.     Comments: Nonverbal, shakes and nods head to some questions      Labs reviewed: Recent Labs    08/13/22 0000 10/13/22 0000 01/22/23 0000  NA 143 139 139  K  --  4.0 4.1  CL 105 104 104  CO2 29* 25* 26*  BUN 26* 18 27*  CREATININE 0.8 0.8 0.9  CALCIUM 9.6 9.4 9.6   Recent Labs    08/13/22 0000 10/13/22 0000  AST 14 13  ALT 10 11  ALKPHOS 45 47  ALBUMIN 3.9 3.5   Recent Labs    08/13/22 0000 10/13/22 0000 01/22/23 0000  WBC 5.4 5.2 11.0  NEUTROABS 2,857.00  --  8,217.00  HGB 13.5 11.9* 12.8  HCT 41 35* 37  PLT  --  261 258   Lab Results  Component Value Date   TSH 1.14 10/16/2021   Lab Results  Component Value Date   HGBA1C 8.2 04/16/2023   Lab Results  Component Value Date   CHOL 147 06/23/2022   HDL 36 06/23/2022   LDLCALC 86 06/23/2022   TRIG 150 06/23/2022   CHOLHDL 4.5 08/20/2019    Significant Diagnostic Results in last 30 days:  No results found.  Assessment/Plan 1. Pressure injury of right buttock, stage 2 (HCC) - slow healing - first noticed 07/30 - unsuccessful trial of prostat> on Glucerna - bed bound - cont daily dressing changes with foam - cont frequent position changes  2. 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 Type 2 diabetes mellitus with other neurologic complication, without long-term current use of insulin (HCC) - A1c stable - no hypoglycemic events - cont metformin  - patient incontinent> do not recommend future urine microalbumin>HPOA would like to discontinue  4. Hyperlipidemia associated with type 2 diabetes mellitus (HCC) - LDL 86> goal < 70 - cont atorvastatin  5. History of TIA (transient ischemic attack) - cont plavix and atorvastatin  6. Oropharyngeal dysphagia - no recent aspirations - followed by ST - cont nectar thick fluids       Family/ staff Communication: plan discussed with patient and nurse  Labs/tests  ordered:  none

## 2023-07-30 DIAGNOSIS — R4701 Aphasia: Secondary | ICD-10-CM | POA: Diagnosis not present

## 2023-07-30 DIAGNOSIS — R279 Unspecified lack of coordination: Secondary | ICD-10-CM | POA: Diagnosis not present

## 2023-07-30 DIAGNOSIS — M6281 Muscle weakness (generalized): Secondary | ICD-10-CM | POA: Diagnosis not present

## 2023-07-30 DIAGNOSIS — G300 Alzheimer's disease with early onset: Secondary | ICD-10-CM | POA: Diagnosis not present

## 2023-07-30 DIAGNOSIS — R1312 Dysphagia, oropharyngeal phase: Secondary | ICD-10-CM | POA: Diagnosis not present

## 2023-08-02 DIAGNOSIS — R4701 Aphasia: Secondary | ICD-10-CM | POA: Diagnosis not present

## 2023-08-02 DIAGNOSIS — R279 Unspecified lack of coordination: Secondary | ICD-10-CM | POA: Diagnosis not present

## 2023-08-02 DIAGNOSIS — R1312 Dysphagia, oropharyngeal phase: Secondary | ICD-10-CM | POA: Diagnosis not present

## 2023-08-02 DIAGNOSIS — G300 Alzheimer's disease with early onset: Secondary | ICD-10-CM | POA: Diagnosis not present

## 2023-08-02 DIAGNOSIS — M6281 Muscle weakness (generalized): Secondary | ICD-10-CM | POA: Diagnosis not present

## 2023-08-03 DIAGNOSIS — G300 Alzheimer's disease with early onset: Secondary | ICD-10-CM | POA: Diagnosis not present

## 2023-08-03 DIAGNOSIS — R1312 Dysphagia, oropharyngeal phase: Secondary | ICD-10-CM | POA: Diagnosis not present

## 2023-08-04 DIAGNOSIS — M6281 Muscle weakness (generalized): Secondary | ICD-10-CM | POA: Diagnosis not present

## 2023-08-04 DIAGNOSIS — R4701 Aphasia: Secondary | ICD-10-CM | POA: Diagnosis not present

## 2023-08-04 DIAGNOSIS — R279 Unspecified lack of coordination: Secondary | ICD-10-CM | POA: Diagnosis not present

## 2023-08-05 DIAGNOSIS — G300 Alzheimer's disease with early onset: Secondary | ICD-10-CM | POA: Diagnosis not present

## 2023-08-05 DIAGNOSIS — R1312 Dysphagia, oropharyngeal phase: Secondary | ICD-10-CM | POA: Diagnosis not present

## 2023-08-06 DIAGNOSIS — R279 Unspecified lack of coordination: Secondary | ICD-10-CM | POA: Diagnosis not present

## 2023-08-06 DIAGNOSIS — M6281 Muscle weakness (generalized): Secondary | ICD-10-CM | POA: Diagnosis not present

## 2023-08-06 DIAGNOSIS — R4701 Aphasia: Secondary | ICD-10-CM | POA: Diagnosis not present

## 2023-08-09 DIAGNOSIS — G300 Alzheimer's disease with early onset: Secondary | ICD-10-CM | POA: Diagnosis not present

## 2023-08-09 DIAGNOSIS — R4701 Aphasia: Secondary | ICD-10-CM | POA: Diagnosis not present

## 2023-08-09 DIAGNOSIS — R1312 Dysphagia, oropharyngeal phase: Secondary | ICD-10-CM | POA: Diagnosis not present

## 2023-08-09 DIAGNOSIS — M6281 Muscle weakness (generalized): Secondary | ICD-10-CM | POA: Diagnosis not present

## 2023-08-09 DIAGNOSIS — R279 Unspecified lack of coordination: Secondary | ICD-10-CM | POA: Diagnosis not present

## 2023-08-10 DIAGNOSIS — G300 Alzheimer's disease with early onset: Secondary | ICD-10-CM | POA: Diagnosis not present

## 2023-08-10 DIAGNOSIS — R1312 Dysphagia, oropharyngeal phase: Secondary | ICD-10-CM | POA: Diagnosis not present

## 2023-08-11 DIAGNOSIS — M6281 Muscle weakness (generalized): Secondary | ICD-10-CM | POA: Diagnosis not present

## 2023-08-11 DIAGNOSIS — R4701 Aphasia: Secondary | ICD-10-CM | POA: Diagnosis not present

## 2023-08-11 DIAGNOSIS — R1312 Dysphagia, oropharyngeal phase: Secondary | ICD-10-CM | POA: Diagnosis not present

## 2023-08-11 DIAGNOSIS — G300 Alzheimer's disease with early onset: Secondary | ICD-10-CM | POA: Diagnosis not present

## 2023-08-11 DIAGNOSIS — R279 Unspecified lack of coordination: Secondary | ICD-10-CM | POA: Diagnosis not present

## 2023-08-12 DIAGNOSIS — G300 Alzheimer's disease with early onset: Secondary | ICD-10-CM | POA: Diagnosis not present

## 2023-08-12 DIAGNOSIS — R1312 Dysphagia, oropharyngeal phase: Secondary | ICD-10-CM | POA: Diagnosis not present

## 2023-08-13 DIAGNOSIS — R279 Unspecified lack of coordination: Secondary | ICD-10-CM | POA: Diagnosis not present

## 2023-08-13 DIAGNOSIS — M6281 Muscle weakness (generalized): Secondary | ICD-10-CM | POA: Diagnosis not present

## 2023-08-13 DIAGNOSIS — G300 Alzheimer's disease with early onset: Secondary | ICD-10-CM | POA: Diagnosis not present

## 2023-08-13 DIAGNOSIS — R4701 Aphasia: Secondary | ICD-10-CM | POA: Diagnosis not present

## 2023-08-13 DIAGNOSIS — R1312 Dysphagia, oropharyngeal phase: Secondary | ICD-10-CM | POA: Diagnosis not present

## 2023-08-16 DIAGNOSIS — R279 Unspecified lack of coordination: Secondary | ICD-10-CM | POA: Diagnosis not present

## 2023-08-16 DIAGNOSIS — R1312 Dysphagia, oropharyngeal phase: Secondary | ICD-10-CM | POA: Diagnosis not present

## 2023-08-16 DIAGNOSIS — M6281 Muscle weakness (generalized): Secondary | ICD-10-CM | POA: Diagnosis not present

## 2023-08-16 DIAGNOSIS — R4701 Aphasia: Secondary | ICD-10-CM | POA: Diagnosis not present

## 2023-08-16 DIAGNOSIS — G300 Alzheimer's disease with early onset: Secondary | ICD-10-CM | POA: Diagnosis not present

## 2023-08-17 DIAGNOSIS — R1312 Dysphagia, oropharyngeal phase: Secondary | ICD-10-CM | POA: Diagnosis not present

## 2023-08-17 DIAGNOSIS — G300 Alzheimer's disease with early onset: Secondary | ICD-10-CM | POA: Diagnosis not present

## 2023-08-18 DIAGNOSIS — R4701 Aphasia: Secondary | ICD-10-CM | POA: Diagnosis not present

## 2023-08-18 DIAGNOSIS — M6281 Muscle weakness (generalized): Secondary | ICD-10-CM | POA: Diagnosis not present

## 2023-08-18 DIAGNOSIS — R279 Unspecified lack of coordination: Secondary | ICD-10-CM | POA: Diagnosis not present

## 2023-08-19 ENCOUNTER — Non-Acute Institutional Stay (SKILLED_NURSING_FACILITY): Payer: Medicare Other | Admitting: Internal Medicine

## 2023-08-19 ENCOUNTER — Encounter: Payer: Self-pay | Admitting: Internal Medicine

## 2023-08-19 DIAGNOSIS — I10 Essential (primary) hypertension: Secondary | ICD-10-CM

## 2023-08-19 DIAGNOSIS — R419 Unspecified symptoms and signs involving cognitive functions and awareness: Secondary | ICD-10-CM

## 2023-08-19 DIAGNOSIS — E785 Hyperlipidemia, unspecified: Secondary | ICD-10-CM | POA: Diagnosis not present

## 2023-08-19 DIAGNOSIS — E1169 Type 2 diabetes mellitus with other specified complication: Secondary | ICD-10-CM | POA: Diagnosis not present

## 2023-08-19 DIAGNOSIS — E1149 Type 2 diabetes mellitus with other diabetic neurological complication: Secondary | ICD-10-CM

## 2023-08-19 DIAGNOSIS — Z8673 Personal history of transient ischemic attack (TIA), and cerebral infarction without residual deficits: Secondary | ICD-10-CM | POA: Diagnosis not present

## 2023-08-19 DIAGNOSIS — R1312 Dysphagia, oropharyngeal phase: Secondary | ICD-10-CM

## 2023-08-19 NOTE — Progress Notes (Unsigned)
Location:  [facility] Room Number: 4A Place of Service:  SNF [phone]) Provider:  Mahlon Gammon, MD   Mahlon Gammon, MD  Patient Care Team: Mahlon Gammon, MD as PCP - General (Internal Medicine) [name], DO as Consulting Physician (Neurology)  Extended Emergency Contact Information Primary Emergency Contact: [name],[name] Address: [address]          [place] [address] [place] Home Phone: [phone] Relation: Sister Secondary Emergency Contact: [facility] Address: [address] [place], [place] [address] [name] Mobile Phone: [phone] Relation: Brother  Code Status:  DNR Goals of care: Advanced Directive information    08/19/2023    4:34 PM  Advanced Directives  Does Patient Have a Medical Advance Directive? Yes  Type of Estate agent of [place];Out of facility DNR (pink MOST or yellow form);Living will  Does patient want to make changes to medical advance directive? No - Patient declined  Copy of Healthcare Power of Attorney in Chart? Yes - validated most recent copy scanned in chart (See row information)     Chief Complaint  Patient presents with   Medical Management of Chronic Issues    Patient is being seen for routine visit   Immunizations    Patient is due for shingles and flu vaccine     HPI:  Pt is a 82 y.o. female seen today for medical management of chronic diseases.   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 Does follows some commands Weight is stable Stays in her Wheelchair Regular Nectar Thick Diet Works with [employer] Readings from Last 3 Encounters:  08/19/23 133 lb  14.4 oz (60.7 kg)  07/29/23 133 lb 8 oz (60.6 kg)  07/07/23 131 lb 12.8 oz (59.8 kg)       Past Medical History:  Diagnosis Date   Diabetes mellitus    Dyslipidemia    Hypertension    Osteoporosis    Urinary incontinence    History reviewed. No pertinent surgical history.  Allergies  Allergen Reactions   Augmentin [Amoxicillin-Pot Clavulanate] Hives   Evista [Raloxifene]    Meloxicam     Unknown reaction   Scallops [Shellfish Allergy] Other (See Comments)    "I don't feel so good after an hour"   Toviaz [Fesoterodine Fumarate Er]     Unknown reaction    Outpatient Encounter Medications as of 08/19/2023  Medication Sig   acetaminophen (TYLENOL) 325 MG tablet Take 650 mg by mouth 2 (two) times daily.   ARTIFICIAL TEAR OP Place 1 drop into both eyes in the morning, at noon, in the evening, and at bedtime.   atorvastatin (LIPITOR) 40 MG tablet Take 1 tablet (40 mg total) by mouth daily.   Calcium Carb-Cholecalciferol (CALTRATE 600+D3 SOFT) 600-20 MG-MCG CHEW Chew 1 tablet by mouth in the morning and at bedtime.   cholecalciferol (VITAMIN D3) 25 MCG (1000 UNIT) tablet Take 1,000 Units by mouth daily.   clopidogrel (PLAVIX) 75 MG tablet Take 75 mg by mouth daily.   melatonin 3 MG TABS tablet Take 3 mg by mouth at bedtime.   metFORMIN (GLUCOPHAGE) 500 MG  tablet Take 500 mg by mouth daily with breakfast.   METFORMIN HCL PO Take 250 mg by mouth every evening.   sennosides-docusate sodium (SENOKOT-S) 8.6-50 MG tablet Take 1 tablet by mouth in the morning and at bedtime. Hold for loose stools   zinc oxide 20 % ointment Apply 1 application topically as needed for irritation.   No facility-administered encounter medications on file as of 08/19/2023.    Review of Systems  Unable to perform ROS: Dementia    Immunization History  Administered Date(s) Administered   Fluad Quad(high Dose 65+) 09/02/2022   Influenza Inj Mdck Quad Pf 08/10/2019   Influenza-Unspecified 08/13/2020,  09/02/2021   Moderna Covid-19 Vaccine Bivalent Booster 75yrs & up 04/22/2022   Moderna SARS-COV2 Booster Vaccination 04/16/2021   Moderna Sars-Covid-2 Vaccination 12/11/2019, 01/08/2020, 04/22/2022   PNEUMOCOCCAL CONJUGATE-20 01/08/2022   Tdap 01/07/2022   Unspecified SARS-COV-2 Vaccination 09/23/2020, 07/30/2021, 09/15/2022   Zoster Recombinant(Shingrix) 07/01/2020   Zoster, Live 07/01/2020   Pertinent  Health Maintenance Due  Topic Date Due   INFLUENZA VACCINE  06/10/2023   HEMOGLOBIN A1C  10/16/2023   OPHTHALMOLOGY EXAM  04/11/2024   FOOT EXAM  06/13/2024   DEXA SCAN  Completed      03/11/2023    9:59 AM 04/12/2023   11:10 AM 04/16/2023    2:26 PM 06/14/2023    2:43 PM 07/07/2023   11:26 AM  Fall Risk  Falls in the past year? 1 1 0 0 0  Was there an injury with Fall? 0 0 0 0 0  Fall Risk Category Calculator 1 1 0 0 0  Patient at Risk for Falls Due to History of fall(s) History of fall(s);Impaired balance/gait;Impaired mobility History of fall(s);Impaired balance/gait;Impaired mobility History of fall(s);Impaired balance/gait;Impaired mobility History of fall(s);Impaired balance/gait;Impaired mobility  Fall risk Follow up Falls evaluation completed Falls evaluation completed;Education provided;Falls prevention discussed Falls evaluation completed;Education provided;Falls prevention discussed Falls evaluation completed;Education provided;Falls prevention discussed Falls evaluation completed;Education provided;Falls prevention discussed   Functional Status Survey:    Vitals:   08/19/23 1618  BP: 109/66  Pulse: 70  Resp: 16  Temp: (!) 97.5 F (36.4 C)  TempSrc: Temporal  SpO2: 95%  Weight: 133 lb 14.4 oz (60.7 kg)  Height: 5\' 3"  (1.6 m)   Body mass index is 23.72 kg/m. Physical Exam Vitals reviewed.  Constitutional:      Appearance: Normal appearance.  HENT:     Head: Normocephalic.     Nose: Nose normal.     Mouth/Throat:     Mouth: Mucous membranes are moist.      Pharynx: Oropharynx is clear.  Eyes:     Pupils: Pupils are equal, round, and reactive to light.  Cardiovascular:     Rate and Rhythm: Normal rate and regular rhythm.     Pulses: Normal pulses.     Heart sounds: Normal heart sounds. No murmur heard. Pulmonary:     Effort: Pulmonary effort is normal.     Breath sounds: Normal breath sounds.  Abdominal:     General: Abdomen is flat. Bowel sounds are normal.     Palpations: Abdomen is soft.  Musculoskeletal:        General: No swelling.     Cervical back: Neck supple.  Skin:    General: Skin is warm.  Neurological:     Mental Status: She is alert.     Comments: Non Verbal   Rigidity in her hands and Arms    Noticed some Curling fingers in Hands  Smiles but does not talk anymore Follows some commands      Psychiatric:        Mood and Affect: Mood normal.        Thought Content: Thought content normal.     Labs reviewed: Recent Labs    10/13/22 0000 01/22/23 0000  NA 139 139  K 4.0 4.1  CL 104 104  CO2 25* 26*  BUN 18 27*  CREATININE 0.8 0.9  CALCIUM 9.4 9.6   Recent Labs    10/13/22 0000  AST 13  ALT 11  ALKPHOS 47  ALBUMIN 3.5   Recent Labs    10/13/22 0000 01/22/23 0000  WBC 5.2 11.0  NEUTROABS  --  8,217.00  HGB 11.9* 12.8  HCT 35* 37  PLT 261 258   Lab Results  Component Value Date   TSH 1.14 10/16/2021   Lab Results  Component Value Date   HGBA1C 8.2 04/16/2023   Lab Results  Component Value Date   CHOL 147 06/23/2022   HDL 36 06/23/2022   LDLCALC 86 06/23/2022   TRIG 150 06/23/2022   CHOLHDL 4.5 08/20/2019    Significant Diagnostic Results in last 30 days:  No results found.  Assessment/Plan 1. Neurocognitive disorder SNF dependent  2. Type 2 diabetes mellitus with other neurologic complication, without long-term current use of insulin (HCC) A1C was elevated in 6/24 CBGS are also running high 180-250 Will Change Metformin again to 500 mg BID  3. Hyperlipidemia associated  with type 2 diabetes mellitus (HCC) On statin Need Follow up of Lipid  4. History of TIA (transient ischemic attack) Plavix and Statin  5. Oropharyngeal dysphagia Nectar thick Weight stable      Family/ staff Communication:   Labs/tests ordered:  CMP,CBC,Lipid,A1C

## 2023-08-20 DIAGNOSIS — R4701 Aphasia: Secondary | ICD-10-CM | POA: Diagnosis not present

## 2023-08-20 DIAGNOSIS — M6281 Muscle weakness (generalized): Secondary | ICD-10-CM | POA: Diagnosis not present

## 2023-08-20 DIAGNOSIS — R279 Unspecified lack of coordination: Secondary | ICD-10-CM | POA: Diagnosis not present

## 2023-08-20 LAB — LIPID PANEL
Cholesterol: 143 (ref 0–200)
HDL: 40 (ref 35–70)
LDL Cholesterol: 76
Triglycerides: 167 — AB (ref 40–160)

## 2023-08-23 DIAGNOSIS — M6281 Muscle weakness (generalized): Secondary | ICD-10-CM | POA: Diagnosis not present

## 2023-08-23 DIAGNOSIS — R279 Unspecified lack of coordination: Secondary | ICD-10-CM | POA: Diagnosis not present

## 2023-08-23 DIAGNOSIS — R4701 Aphasia: Secondary | ICD-10-CM | POA: Diagnosis not present

## 2023-08-25 DIAGNOSIS — R4701 Aphasia: Secondary | ICD-10-CM | POA: Diagnosis not present

## 2023-08-25 DIAGNOSIS — M6281 Muscle weakness (generalized): Secondary | ICD-10-CM | POA: Diagnosis not present

## 2023-08-25 DIAGNOSIS — R279 Unspecified lack of coordination: Secondary | ICD-10-CM | POA: Diagnosis not present

## 2023-08-30 DIAGNOSIS — R799 Abnormal finding of blood chemistry, unspecified: Secondary | ICD-10-CM | POA: Diagnosis not present

## 2023-08-30 DIAGNOSIS — D649 Anemia, unspecified: Secondary | ICD-10-CM | POA: Diagnosis not present

## 2023-08-30 LAB — BASIC METABOLIC PANEL
BUN: 18 (ref 4–21)
CO2: 29 — AB (ref 13–22)
Chloride: 101 (ref 99–108)
Creatinine: 0.8 (ref 0.5–1.1)
Glucose: 184
Potassium: 4.2 meq/L (ref 3.5–5.1)
Sodium: 134 — AB (ref 137–147)

## 2023-08-30 LAB — CBC AND DIFFERENTIAL
HCT: 38 (ref 36–46)
Hemoglobin: 12.8 (ref 12.0–16.0)
Platelets: 241 10*3/uL (ref 150–400)
WBC: 4.8

## 2023-08-30 LAB — LIPID PANEL
Cholesterol: 149 (ref 0–200)
HDL: 35 (ref 35–70)
LDL Cholesterol: 85
Triglycerides: 194 — AB (ref 40–160)

## 2023-08-30 LAB — HEPATIC FUNCTION PANEL
ALT: 16 U/L (ref 7–35)
AST: 14 (ref 13–35)
Alkaline Phosphatase: 63 (ref 25–125)
Bilirubin, Total: 0.6

## 2023-08-30 LAB — COMPREHENSIVE METABOLIC PANEL
Albumin: 3.8 (ref 3.5–5.0)
Calcium: 9.4 (ref 8.7–10.7)
Globulin: 2.1

## 2023-08-30 LAB — HEMOGLOBIN A1C: Hemoglobin A1C: 8.5

## 2023-08-30 LAB — CBC: RBC: 4.08 (ref 3.87–5.11)

## 2023-09-10 DIAGNOSIS — M79672 Pain in left foot: Secondary | ICD-10-CM | POA: Diagnosis not present

## 2023-09-10 DIAGNOSIS — L602 Onychogryphosis: Secondary | ICD-10-CM | POA: Diagnosis not present

## 2023-09-10 DIAGNOSIS — M79671 Pain in right foot: Secondary | ICD-10-CM | POA: Diagnosis not present

## 2023-09-17 ENCOUNTER — Encounter: Payer: Self-pay | Admitting: Orthopedic Surgery

## 2023-09-17 ENCOUNTER — Non-Acute Institutional Stay (SKILLED_NURSING_FACILITY): Payer: Self-pay | Admitting: Orthopedic Surgery

## 2023-09-17 DIAGNOSIS — R634 Abnormal weight loss: Secondary | ICD-10-CM | POA: Diagnosis not present

## 2023-09-17 DIAGNOSIS — E785 Hyperlipidemia, unspecified: Secondary | ICD-10-CM

## 2023-09-17 DIAGNOSIS — E1169 Type 2 diabetes mellitus with other specified complication: Secondary | ICD-10-CM

## 2023-09-17 DIAGNOSIS — R1312 Dysphagia, oropharyngeal phase: Secondary | ICD-10-CM | POA: Diagnosis not present

## 2023-09-17 DIAGNOSIS — N3946 Mixed incontinence: Secondary | ICD-10-CM

## 2023-09-17 DIAGNOSIS — R419 Unspecified symptoms and signs involving cognitive functions and awareness: Secondary | ICD-10-CM | POA: Diagnosis not present

## 2023-09-17 DIAGNOSIS — Z8673 Personal history of transient ischemic attack (TIA), and cerebral infarction without residual deficits: Secondary | ICD-10-CM

## 2023-09-17 DIAGNOSIS — E1149 Type 2 diabetes mellitus with other diabetic neurological complication: Secondary | ICD-10-CM | POA: Diagnosis not present

## 2023-09-17 NOTE — Progress Notes (Signed)
Location:   [facility]  Nursing Home Room Number: 4-A Place of Service:  SNF [phone]) Provider:  Hazle Nordmann, NP  PCP: Mahlon Gammon, MD  Patient Care Team: Mahlon Gammon, MD as PCP - General (Internal Medicine) [name], DO as Consulting Physician (Neurology)  Extended Emergency Contact Information Primary Emergency Contact: [name],[name] Address: [address]          [place] [address] [name] Home Phone: [phone] Relation: Sister Secondary Emergency Contact: [facility] Address: [address] [place], [place] [address] [name] Mobile Phone: [phone] Relation: Brother  Code Status:  DNR Goals of care: Advanced Directive information    09/17/2023   10:28 AM  Advanced Directives  Does Patient Have a Medical Advance Directive? Yes  Type of Estate agent of [place];Living will;Out of facility DNR (pink MOST or yellow form)  Does patient want to make changes to medical advance directive? No - Patient declined  Copy of Healthcare Power of Attorney in Chart? Yes - validated most recent copy scanned in chart (See row information)     Chief Complaint  Patient presents with   Medical Management of Chronic Issues    Routine Visit.    Immunizations    Discuss the need for Shingrix vaccine.     HPI:  Pt is a 82 y.o. female seen today for medical management of chronic diseases.    She currently resides on the skilled nursing unit at [facility]. 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   Recent weights:  11/04- 127.1 lbs  10/05- 133.9 lbs  08/06- 131.8 lbs  Recent blood pressures:  11/05- 105/59  10/29- 113/65  10/22- 122/56  Past Medical History:  Diagnosis Date   Diabetes mellitus    Dyslipidemia    Hypertension    Osteoporosis    Urinary incontinence    History reviewed. No pertinent surgical history.  Allergies  Allergen Reactions   Augmentin [Amoxicillin-Pot Clavulanate] Hives   Evista [Raloxifene]    Meloxicam     Unknown reaction   Scallops [Shellfish Allergy] Other (See Comments)    "I don't feel so good after an hour"   Toviaz [Fesoterodine Fumarate Er]     Unknown reaction    Allergies as of 09/17/2023       Reactions   Augmentin [amoxicillin-pot Clavulanate] Hives   Evista [raloxifene]    Meloxicam    Unknown reaction   Scallops [shellfish Allergy] Other (See Comments)   "I don't feel so good after an hour"   Toviaz [fesoterodine Fumarate Er]    Unknown reaction        Medication List        Accurate as of September 17, 2023 10:28 AM. If you have any questions, ask your nurse or doctor.  acetaminophen 325 MG tablet Commonly known as: TYLENOL Take 650 mg by mouth 2 (two) times daily.   ARTIFICIAL TEAR OP Place 1 drop into both eyes in the morning, at noon, in the evening, and at bedtime.   atorvastatin 40 MG tablet Commonly known as: LIPITOR Take 1 tablet (40 mg total) by mouth daily.   Caltrate 600+D3 Soft 600-20 MG-MCG Chew Generic drug: Calcium Carb-Cholecalciferol Chew 1 tablet by mouth in the morning and at bedtime.   cholecalciferol 25 MCG (1000 UNIT) tablet Commonly known as: VITAMIN D3 Take 1,000 Units by mouth  daily.   clopidogrel 75 MG tablet Commonly known as: PLAVIX Take 75 mg by mouth daily.   melatonin 3 MG Tabs tablet Take 3 mg by mouth at bedtime.   metFORMIN 500 MG tablet Commonly known as: GLUCOPHAGE Take 500 mg by mouth daily with breakfast. What changed: Another medication with the same name was removed. Continue taking this medication, and follow the directions you see here. Changed by: Octavia Heir   sennosides-docusate sodium 8.6-50 MG tablet Commonly known as: SENOKOT-S Take 1 tablet by mouth in the morning and at bedtime. Hold for loose stools   zinc oxide 20 % ointment Apply 1 application topically as needed for irritation.        Review of Systems  Unable to perform ROS: Dementia    Immunization History  Administered Date(s) Administered   Fluad Quad(high Dose 65+) 09/02/2022   Influenza Inj Mdck Quad Pf 08/10/2019   Influenza, High Dose Seasonal PF 09/08/2023   Influenza-Unspecified 08/13/2020, 09/02/2021   Moderna Covid-19 Vaccine Bivalent Booster 5yrs & up 04/22/2022   Moderna SARS-COV2 Booster Vaccination 04/16/2021   Moderna Sars-Covid-2 Vaccination 12/11/2019, 01/08/2020, 04/22/2022   PNEUMOCOCCAL CONJUGATE-20 01/08/2022   Tdap 01/07/2022   Unspecified SARS-COV-2 Vaccination 09/23/2020, 07/30/2021, 09/15/2022   Zoster Recombinant(Shingrix) 07/01/2020   Pertinent  Health Maintenance Due  Topic Date Due   HEMOGLOBIN A1C  10/16/2023   OPHTHALMOLOGY EXAM  04/11/2024   FOOT EXAM  06/13/2024   INFLUENZA VACCINE  Completed   DEXA SCAN  Completed      03/11/2023    9:59 AM 04/12/2023   11:10 AM 04/16/2023    2:26 PM 06/14/2023    2:43 PM 07/07/2023   11:26 AM  Fall Risk  Falls in the past year? 1 1 0 0 0  Was there an injury with Fall? 0 0 0 0 0  Fall Risk Category Calculator 1 1 0 0 0  Patient at Risk for Falls Due to History of fall(s) History of fall(s);Impaired balance/gait;Impaired mobility History of fall(s);Impaired balance/gait;Impaired mobility  History of fall(s);Impaired balance/gait;Impaired mobility History of fall(s);Impaired balance/gait;Impaired mobility  Fall risk Follow up Falls evaluation completed Falls evaluation completed;Education provided;Falls prevention discussed Falls evaluation completed;Education provided;Falls prevention discussed Falls evaluation completed;Education provided;Falls prevention discussed Falls evaluation completed;Education provided;Falls prevention discussed   Functional Status Survey:    Vitals:   09/17/23 1023  BP: (!) 105/59  Pulse: 70  Resp: 16  Temp: (!) 96.6 F (35.9 C)  SpO2: 97%  Weight: 127 lb 1.6 oz (57.7 kg)  Height: 5\' 3"  (1.6 m)   Body mass index is 22.51 kg/m. Physical Exam Vitals reviewed.  Constitutional:      General: She is not in acute distress. HENT:     Head: Normocephalic.     Right Ear: There is no impacted cerumen.     Left Ear: There is no impacted cerumen.     Nose: Nose normal.  Mouth/Throat:     Mouth: Mucous membranes are moist.  Eyes:     General:        Right eye: No discharge.        Left eye: No discharge.  Cardiovascular:     Rate and Rhythm: Normal rate and regular rhythm.     Pulses: Normal pulses.     Heart sounds: Normal heart sounds.  Pulmonary:     Effort: Pulmonary effort is normal. No respiratory distress.     Breath sounds: Normal breath sounds. No wheezing.  Abdominal:     General: Bowel sounds are normal.     Palpations: Abdomen is soft.  Musculoskeletal:     Cervical back: Neck supple.     Right lower leg: No edema.     Left lower leg: No edema.  Skin:    General: Skin is warm.     Capillary Refill: Capillary refill takes less than 2 seconds.  Neurological:     General: No focal deficit present.     Mental Status: She is alert. Mental status is at baseline.     Motor: Weakness present.     Gait: Gait abnormal.     Comments: Nonverbal, follows some commands, BUE with rigidity, developing right hand contracture      Labs reviewed: Recent Labs    10/13/22 0000 01/22/23 0000  NA 139 139  K 4.0 4.1  CL 104 104  CO2 25* 26*  BUN 18 27*  CREATININE 0.8 0.9  CALCIUM 9.4 9.6   Recent Labs    10/13/22 0000  AST 13  ALT 11  ALKPHOS 47  ALBUMIN 3.5   Recent Labs    10/13/22 0000 01/22/23 0000  WBC 5.2 11.0  NEUTROABS  --  8,217.00  HGB 11.9* 12.8  HCT 35* 37  PLT 261 258   Lab Results  Component Value Date   TSH 1.14 10/16/2021   Lab Results  Component Value Date   HGBA1C 8.2 04/16/2023   Lab Results  Component Value Date   CHOL 143 08/20/2023   HDL 40 08/20/2023   LDLCALC 76 08/20/2023   TRIG 167 (A) 08/20/2023   CHOLHDL 4.5 08/20/2019    Significant Diagnostic Results in last 30 days:  No results found.  Assessment/Plan 1. Weight loss - ongoing - associated with neurocognitive disorder - followed by dietary - cont Glucerna BID  2. Oropharyngeal dysphagia - no recent aspirations - followed by ST - cont regular foods with NTL  3. Neurocognitive disorder - nonverbal - more rigidity to BUE - developing right hand contracture - cont skilled nursing  4. Type 2 diabetes mellitus with other neurologic complication, without long-term current use of insulin (HCC) - Recent A1c 8.5 (10/22) - metformin increased to BID - plan for A1c check in 3 months   5. Hyperlipidemia associated with type 2 diabetes mellitus (HCC) - LDL 85, goal < 70 - do not recommend medication adjustment due to weight loss - cont statin  6. History of TIA (transient ischemic attack) - cont plavix and statin  7. Mixed stress and urge urinary incontinence - cont skilled nursing care     Family/ staff Communication: plan discussed with patient and nurse  Labs/tests ordered: none

## 2023-10-11 ENCOUNTER — Non-Acute Institutional Stay (SKILLED_NURSING_FACILITY): Payer: Medicare Other | Admitting: Orthopedic Surgery

## 2023-10-11 ENCOUNTER — Encounter: Payer: Self-pay | Admitting: Orthopedic Surgery

## 2023-10-11 DIAGNOSIS — R419 Unspecified symptoms and signs involving cognitive functions and awareness: Secondary | ICD-10-CM

## 2023-10-11 DIAGNOSIS — R1312 Dysphagia, oropharyngeal phase: Secondary | ICD-10-CM

## 2023-10-11 DIAGNOSIS — E1149 Type 2 diabetes mellitus with other diabetic neurological complication: Secondary | ICD-10-CM

## 2023-10-11 DIAGNOSIS — Z8673 Personal history of transient ischemic attack (TIA), and cerebral infarction without residual deficits: Secondary | ICD-10-CM

## 2023-10-11 DIAGNOSIS — R634 Abnormal weight loss: Secondary | ICD-10-CM

## 2023-10-11 DIAGNOSIS — S51011A Laceration without foreign body of right elbow, initial encounter: Secondary | ICD-10-CM

## 2023-10-11 DIAGNOSIS — E1169 Type 2 diabetes mellitus with other specified complication: Secondary | ICD-10-CM

## 2023-10-11 DIAGNOSIS — D692 Other nonthrombocytopenic purpura: Secondary | ICD-10-CM

## 2023-10-11 DIAGNOSIS — E785 Hyperlipidemia, unspecified: Secondary | ICD-10-CM

## 2023-10-11 NOTE — Progress Notes (Signed)
Location:   [facility]  Nursing Home Room Number: 4-A Place of Service:  SNF [phone]) Provider:  Hazle Nordmann, NP  PCP: Mahlon Gammon, MD  Patient Care Team: Mahlon Gammon, MD as PCP - General (Internal Medicine) [name], DO as Consulting Physician (Neurology)  Extended Emergency Contact Information Primary Emergency Contact: [name],[name] Address: [address]          [place] [address] [name] Home Phone: [phone] Relation: Sister Secondary Emergency Contact: [facility] Address: [address] [place], [place] [address] [name] Mobile Phone: [phone] Relation: Brother  Code Status:  DNR Goals of care: Advanced Directive information    10/11/2023   10:32 AM  Advanced Directives  Does Patient Have a Medical Advance Directive? Yes  Type of Estate agent of [place];Living will;Out of facility DNR (pink MOST or yellow form)  Does patient want to make changes to medical advance directive? No - Patient declined  Copy of Healthcare Power of Attorney in Chart? Yes - validated most recent copy scanned in chart (See row information)     Chief Complaint  Patient presents with   Medical Management of Chronic Issues    Routine Visit.     HPI:  Pt is a 82 y.o. female seen today for medical management of chronic diseases.    She currently resides on the skilled nursing unit at [facility]. 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   Weight loss- BMI 22.89, see trends below, follows by dietary, remains on magic cups and Glucerna  Dysphagia- no recent aspirations, followed by ST, remains on regular foods with nectar thick liquids Neurocognitive disorder- dependent with ADLs except feeding, no behaviors, nonverbal, hoyer transfer, developing contracture  to right hand> thumb and index finger, not on medication T2DM-  A1c 8.5 (10/22)> was 8.2 (06/07)> was 6.4 (12/05), no recent hypoglycemic events, remains on metformin> now BID, regular diet HLD- LDL 85 08/31/2023, remains on Lipitor Hx of TIA- remains on Plavix and statin  Recent weights:  11/27- 129.2 lbs  11/04- 127.1 lbs  10/05- 133.9 lbs  Recent blood pressures:  11/26- 114/72  11/19- 108/66  11/12- 119/71  Past Medical History:  Diagnosis Date   Diabetes mellitus    Dyslipidemia    Hypertension    Osteoporosis    Urinary incontinence    History reviewed. No pertinent surgical history.  Allergies  Allergen Reactions   Augmentin [Amoxicillin-Pot Clavulanate] Hives   Evista [Raloxifene]    Meloxicam     Unknown reaction   Scallops [Shellfish Allergy] Other (See Comments)    "I don't feel so good after an hour"   Toviaz [Fesoterodine Fumarate Er]     Unknown reaction    Allergies as of 10/11/2023       Reactions   Augmentin [amoxicillin-pot Clavulanate] Hives   Evista [raloxifene]    Meloxicam    Unknown reaction   Scallops [shellfish Allergy] Other (See Comments)   "I don't feel so good after an hour"   Toviaz [fesoterodine Fumarate Er]    Unknown reaction        Medication List        Accurate as of October 11, 2023 10:32 AM. If you have any questions, ask your nurse or doctor.  acetaminophen 325 MG tablet Commonly known as: TYLENOL Take 650 mg by mouth 2 (two) times daily.   ARTIFICIAL TEAR OP Place 1 drop into both eyes in the morning, at noon, in the evening, and at bedtime.   atorvastatin 40 MG tablet Commonly known as: LIPITOR Take 1 tablet (40 mg total) by mouth daily.   Caltrate 600+D3 Soft 600-20 MG-MCG Chew Generic drug: Calcium Carb-Cholecalciferol Chew 1 tablet by mouth in the morning and at bedtime.   cholecalciferol 25 MCG (1000 UNIT) tablet Commonly known as: VITAMIN D3 Take 1,000 Units by mouth daily.    clopidogrel 75 MG tablet Commonly known as: PLAVIX Take 75 mg by mouth daily.   melatonin 3 MG Tabs tablet Take 3 mg by mouth at bedtime.   metFORMIN 500 MG tablet Commonly known as: GLUCOPHAGE Take 500 mg by mouth daily with breakfast.   sennosides-docusate sodium 8.6-50 MG tablet Commonly known as: SENOKOT-S Take 1 tablet by mouth in the morning and at bedtime. Hold for loose stools   zinc oxide 20 % ointment Apply 1 application topically as needed for irritation.        Review of Systems  Unable to perform ROS: Patient nonverbal    Immunization History  Administered Date(s) Administered   Fluad Quad(high Dose 65+) 09/02/2022   Influenza Inj Mdck Quad Pf 08/10/2019   Influenza, High Dose Seasonal PF 09/08/2023   Influenza-Unspecified 08/13/2020, 09/02/2021   Moderna Covid-19 Vaccine Bivalent Booster 22yrs & up 04/22/2022   Moderna SARS-COV2 Booster Vaccination 04/16/2021   Moderna Sars-Covid-2 Vaccination 12/11/2019, 01/08/2020, 04/22/2022   PNEUMOCOCCAL CONJUGATE-20 01/08/2022   Tdap 01/07/2022   Unspecified SARS-COV-2 Vaccination 09/23/2020, 07/30/2021, 09/15/2022   Zoster Recombinant(Shingrix) 07/01/2020, 09/15/2022   Pertinent  Health Maintenance Due  Topic Date Due   HEMOGLOBIN A1C  10/16/2023   OPHTHALMOLOGY EXAM  04/11/2024   FOOT EXAM  09/16/2024   INFLUENZA VACCINE  Completed   DEXA SCAN  Completed      04/12/2023   11:10 AM 04/16/2023    2:26 PM 06/14/2023    2:43 PM 07/07/2023   11:26 AM 09/17/2023    2:25 PM  Fall Risk  Falls in the past year? 1 0 0 0 0  Was there an injury with Fall? 0 0 0 0 0  Fall Risk Category Calculator 1 0 0 0 0  Patient at Risk for Falls Due to History of fall(s);Impaired balance/gait;Impaired mobility History of fall(s);Impaired balance/gait;Impaired mobility History of fall(s);Impaired balance/gait;Impaired mobility History of fall(s);Impaired balance/gait;Impaired mobility History of fall(s);Impaired balance/gait;Impaired  mobility  Fall risk Follow up Falls evaluation completed;Education provided;Falls prevention discussed Falls evaluation completed;Education provided;Falls prevention discussed Falls evaluation completed;Education provided;Falls prevention discussed Falls evaluation completed;Education provided;Falls prevention discussed Falls evaluation completed;Education provided;Falls prevention discussed   Functional Status Survey:    Vitals:   10/11/23 1029  BP: 114/72  Pulse: 74  Resp: 20  Temp: 97.8 F (36.6 C)  SpO2: 98%  Weight: 129 lb 3.2 oz (58.6 kg)  Height: 5\' 3"  (1.6 m)   Body mass index is 22.89 kg/m. Physical Exam Vitals reviewed.  Constitutional:      General: She is not in acute distress. HENT:     Head: Normocephalic.     Right Ear: There is no impacted cerumen.     Left Ear: There is no impacted cerumen.     Nose: Nose normal.     Mouth/Throat:     Mouth: Mucous membranes are moist.  Eyes:     General:  Right eye: No discharge.        Left eye: No discharge.  Cardiovascular:     Rate and Rhythm: Normal rate and regular rhythm.     Pulses: Normal pulses.     Heart sounds: Normal heart sounds.  Pulmonary:     Effort: Pulmonary effort is normal. No respiratory distress.     Breath sounds: Normal breath sounds. No wheezing.  Abdominal:     General: Bowel sounds are normal. There is no distension.     Palpations: Abdomen is soft.     Tenderness: There is no abdominal tenderness.  Musculoskeletal:     Cervical back: Neck supple.     Right lower leg: No edema.     Left lower leg: No edema.  Skin:    General: Skin is warm.     Capillary Refill: Capillary refill takes less than 2 seconds.     Comments: Small skin tear to right elbow, CDI, no infection, surrounding skin intact. Non tender purple extremities to extremities, vary in size, no skin breakdown.   Neurological:     General: No focal deficit present.     Mental Status: She is alert. Mental status is at  baseline.     Motor: Weakness present.     Gait: Gait abnormal.  Psychiatric:        Mood and Affect: Mood normal.     Comments: Nonverbal, alert to self and familiar face, follows commands     Labs reviewed: Recent Labs    10/13/22 0000 01/22/23 0000  NA 139 139  K 4.0 4.1  CL 104 104  CO2 25* 26*  BUN 18 27*  CREATININE 0.8 0.9  CALCIUM 9.4 9.6   Recent Labs    10/13/22 0000  AST 13  ALT 11  ALKPHOS 47  ALBUMIN 3.5   Recent Labs    10/13/22 0000 01/22/23 0000  WBC 5.2 11.0  NEUTROABS  --  8,217.00  HGB 11.9* 12.8  HCT 35* 37  PLT 261 258   Lab Results  Component Value Date   TSH 1.14 10/16/2021   Lab Results  Component Value Date   HGBA1C 8.2 04/16/2023   Lab Results  Component Value Date   CHOL 143 08/20/2023   HDL 40 08/20/2023   LDLCALC 76 08/20/2023   TRIG 167 (A) 08/20/2023   CHOLHDL 4.5 08/20/2019    Significant Diagnostic Results in last 30 days:  No results found.  Assessment/Plan 1. Skin tear of right elbow without complication, initial encounter - DOI not documented per chart review - no sign of infection, appears healing - cover with polymem prn  2. Weight loss - ongoing - followed by dietary - cont magic cups and Glucerna  3. Oropharyngeal dysphagia - followed by ST - no recent aspirations  - cont regular diet with NTL  4. Neurocognitive disorder - progressed over past year - hoyer transfer - nonverbal - weight slowly trending down - cont skilled nursing  5. Type 2 diabetes mellitus with other neurologic complication, without long-term current use of insulin (HCC) - A1c 8.5 - cont metformin BID  6. Hyperlipidemia associated with type 2 diabetes mellitus (HCC) - cont statin  7. History of TIA (transient ischemic attack) - cont Plavix ans statin  8. Senile purpura (HCC) - education given to staff and patient    Family/ staff Communication: plan discussed with patient and nurse  Labs/tests ordered: none

## 2023-10-29 DIAGNOSIS — H04123 Dry eye syndrome of bilateral lacrimal glands: Secondary | ICD-10-CM | POA: Diagnosis not present

## 2023-11-16 ENCOUNTER — Encounter: Payer: Self-pay | Admitting: Adult Health

## 2023-11-16 ENCOUNTER — Non-Acute Institutional Stay (SKILLED_NURSING_FACILITY): Payer: Self-pay | Admitting: Adult Health

## 2023-11-16 DIAGNOSIS — Z8673 Personal history of transient ischemic attack (TIA), and cerebral infarction without residual deficits: Secondary | ICD-10-CM | POA: Diagnosis not present

## 2023-11-16 DIAGNOSIS — E1149 Type 2 diabetes mellitus with other diabetic neurological complication: Secondary | ICD-10-CM | POA: Diagnosis not present

## 2023-11-16 DIAGNOSIS — M62838 Other muscle spasm: Secondary | ICD-10-CM | POA: Diagnosis not present

## 2023-11-16 MED ORDER — BACLOFEN 5 MG PO TABS: 2.5000 mg | ORAL_TABLET | Freq: Two times a day (BID) | ORAL | 0 refills | Status: AC | PRN

## 2023-11-16 NOTE — Progress Notes (Signed)
 Location:  [facility] Room Number: 4-A Place of Service:  SNF (31) Provider:  Medina-Vargas, Appollonia Klee, DNP, FNP-BC  Patient Care Team: Charlanne Fredia CROME, MD as PCP - General (Internal Medicine) Skeet Juliene SAUNDERS, DO as Consulting Physician (Neurology)  Extended Emergency Contact Information Primary Emergency Contact: [name],[name] Address: [address]          [place] [address] [place] Home Phone: [phone] Relation: Sister Secondary Emergency Contact: [facility] Address: [address] [place], [place] [address] [place] Phone: [phone] Relation: Brother  Code Status:   DNR  Goals of care: Advanced Directive information    10/11/2023   10:32 AM  Advanced Directives  Does Patient Have a Medical Advance Directive? Yes  Type of Estate Agent of [place];Living will;Out of facility DNR (pink MOST or yellow form)  Does patient want to make changes to medical advance directive? No - Patient declined  Copy of Healthcare Power of Attorney in Chart? Yes - validated most recent copy scanned in chart (See row information)     Chief Complaint  Patient presents with   Acute Visit    RLE pain    HPI:  Pt is a 83 y.o. female seen today an acute visit regarding stiffness on RLE. She is a resident of [id number]. She was noted to have stiffness on RLE. RLE is bent on her knee and tender when being straightened. Charge nurse reported that she usually use sit to stand lift. She was given Acetaminophen  for pain.   Past Medical History:  Diagnosis Date   Diabetes mellitus    Dyslipidemia    Hypertension    Osteoporosis    Urinary incontinence    No past surgical history on file.  Allergies  Allergen Reactions   Augmentin [Amoxicillin-Pot Clavulanate] Hives   Evista [Raloxifene]    Meloxicam     Unknown reaction   Scallops [Shellfish Allergy] Other (See Comments)    I  don't feel so good after an hour   Toviaz [Fesoterodine Fumarate Er]     Unknown reaction    Outpatient Encounter Medications as of 11/16/2023  Medication Sig   Baclofen  5 MG TABS Take 0.5 tablets (2.5 mg total) by mouth 2 (two) times daily as needed for up to 7 days.   acetaminophen  (TYLENOL ) 325 MG tablet Take 650 mg by mouth 2 (two) times daily.   ARTIFICIAL TEAR OP Place 1 drop into both eyes in the morning, at noon, in the evening, and at bedtime.   atorvastatin  (LIPITOR) 40 MG tablet Take 1 tablet (40 mg total) by mouth daily.   Calcium  Carb-Cholecalciferol (CALTRATE 600+D3 SOFT) 600-20 MG-MCG CHEW Chew 1 tablet by mouth in the morning and at bedtime.   cholecalciferol (VITAMIN D3) 25 MCG (1000 UNIT) tablet Take 1,000 Units by mouth daily.   clopidogrel  (PLAVIX ) 75 MG tablet Take 75 mg by mouth daily.   melatonin 3 MG TABS tablet Take 3 mg by mouth at bedtime.   metFORMIN  (GLUCOPHAGE ) 500 MG tablet Take 500 mg by mouth 2 (two) times daily with a meal.   sennosides-docusate sodium  (SENOKOT-S) 8.6-50 MG tablet Take 1 tablet by mouth in the morning and at bedtime. Hold for loose stools   zinc oxide 20 % ointment Apply 1 application topically as needed for irritation.   No facility-administered encounter medications on file as of 11/16/2023.  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    Immunization History  Administered Date(s) Administered   Fluad Quad(high Dose 65+) 09/02/2022   Influenza Inj Mdck Quad Pf 08/10/2019   Influenza, High Dose Seasonal PF 09/08/2023   Influenza-Unspecified 08/13/2020, 09/02/2021   Moderna Covid-19 Vaccine Bivalent Booster 60yrs & up 04/22/2022   Moderna SARS-COV2 Booster Vaccination 04/16/2021   Moderna Sars-Covid-2 Vaccination 12/11/2019, 01/08/2020, 04/22/2022   PNEUMOCOCCAL CONJUGATE-20 01/08/2022   Tdap 01/07/2022   Unspecified SARS-COV-2 Vaccination 09/23/2020, 07/30/2021, 09/15/2022   Zoster Recombinant(Shingrix)  07/01/2020, 09/15/2022   Pertinent  Health Maintenance Due  Topic Date Due   HEMOGLOBIN A1C  10/16/2023   FOOT EXAM  09/16/2024   OPHTHALMOLOGY EXAM  10/28/2024   INFLUENZA VACCINE  Completed   DEXA SCAN  Completed      04/12/2023   11:10 AM 04/16/2023    2:26 PM 06/14/2023    2:43 PM 07/07/2023   11:26 AM 09/17/2023    2:25 PM  Fall Risk  Falls in the past year? 1 0 0 0 0  Was there an injury with Fall? 0 0 0 0 0  Fall Risk Category Calculator 1 0 0 0 0  Patient at Risk for Falls Due to History of fall(s);Impaired balance/gait;Impaired mobility History of fall(s);Impaired balance/gait;Impaired mobility History of fall(s);Impaired balance/gait;Impaired mobility History of fall(s);Impaired balance/gait;Impaired mobility History of fall(s);Impaired balance/gait;Impaired mobility  Fall risk Follow up Falls evaluation completed;Education provided;Falls prevention discussed Falls evaluation completed;Education provided;Falls prevention discussed Falls evaluation completed;Education provided;Falls prevention discussed Falls evaluation completed;Education provided;Falls prevention discussed Falls evaluation completed;Education provided;Falls prevention discussed     Vitals:   11/16/23 1051  BP: (!) 107/57  Pulse: 68  Resp: 16  Temp: (!) 96.4 F (35.8 C)  SpO2: 99%  Weight: 123 lb 6.4 oz (56 kg)  Height: 5' 3 (1.6 m)   Body mass index is 21.86 kg/m.  Physical Exam Constitutional:      General: She is not in acute distress.    Appearance: Normal appearance.  HENT:     Head: Normocephalic and atraumatic.     Nose: Nose normal.     Mouth/Throat:     Mouth: Mucous membranes are moist.  Eyes:     Conjunctiva/sclera: Conjunctivae normal.  Cardiovascular:     Rate and Rhythm: Normal rate and regular rhythm.  Pulmonary:     Effort: Pulmonary effort is normal.     Breath sounds: Normal breath sounds.  Abdominal:     General: Bowel sounds are normal.     Palpations: Abdomen is soft.   Musculoskeletal:     Comments: RLE spastic, bent from knee Right hand with palm guard  Skin:    General: Skin is warm and dry.  Neurological:     Comments: Nonverbal  Psychiatric:        Mood and Affect: Mood normal.        Behavior: Behavior normal.      Labs reviewed: Recent Labs    01/22/23 0000  NA 139  K 4.1  CL 104  CO2 26*  BUN 27*  CREATININE 0.9  CALCIUM  9.6   No results for input(s): AST, ALT, ALKPHOS, BILITOT, PROT, ALBUMIN in the last 8760 hours. Recent Labs    01/22/23 0000  WBC 11.0  NEUTROABS 8,217.00  HGB 12.8  HCT 37  PLT 258   Lab Results  Component Value Date   TSH 1.14 10/16/2021   Lab Results  Component Value Date  HGBA1C 8.2 04/16/2023   Lab Results  Component Value Date   CHOL 143 08/20/2023   HDL 40 08/20/2023   LDLCALC 76 08/20/2023   TRIG 167 (A) 08/20/2023   CHOLHDL 4.5 08/20/2019    Significant Diagnostic Results in last 30 days:  No results found.  Assessment/Plan  1. Muscle spasm of right lower extremity (Primary) -  will start on Baclofen  PRN -  PT evaluation for RLE spasticity -  continue Acetaminophen  650 mg PO BID - Baclofen  5 MG TABS; Take 0.5 tablets (2.5 mg total) by mouth 2 (two) times daily as needed for up to 7 days.  Dispense: 7 tablet; Refill: 0  2. Type 2 diabetes mellitus with other neurologic complication, without long-term current use of insulin  (HCC) Lab Results  Component Value Date   HGBA1C 8.2 04/16/2023    -  continue Metformin   3. History of TIA (transient ischemic attack) -  stable -  continue Atorvastatin  and Plavix     Family/ staff Communication: Discussed plan of care with rcharge nurse.  Labs/tests ordered:  None    Judi Jaffe Medina-Vargas, DNP, MSN, FNP-BC [facility] [phone] (Monday-Friday 8:00 a.m. - 5:00 p.m.) [phone] (after hours)

## 2023-11-18 ENCOUNTER — Non-Acute Institutional Stay (SKILLED_NURSING_FACILITY): Payer: Medicare Other | Admitting: Internal Medicine

## 2023-11-18 ENCOUNTER — Encounter: Payer: Self-pay | Admitting: Internal Medicine

## 2023-11-18 DIAGNOSIS — R419 Unspecified symptoms and signs involving cognitive functions and awareness: Secondary | ICD-10-CM

## 2023-11-18 DIAGNOSIS — E1169 Type 2 diabetes mellitus with other specified complication: Secondary | ICD-10-CM

## 2023-11-18 DIAGNOSIS — R1312 Dysphagia, oropharyngeal phase: Secondary | ICD-10-CM

## 2023-11-18 DIAGNOSIS — Z8673 Personal history of transient ischemic attack (TIA), and cerebral infarction without residual deficits: Secondary | ICD-10-CM | POA: Diagnosis not present

## 2023-11-18 DIAGNOSIS — R634 Abnormal weight loss: Secondary | ICD-10-CM

## 2023-11-18 DIAGNOSIS — M62461 Contracture of muscle, right lower leg: Secondary | ICD-10-CM

## 2023-11-18 DIAGNOSIS — E1149 Type 2 diabetes mellitus with other diabetic neurological complication: Secondary | ICD-10-CM | POA: Diagnosis not present

## 2023-11-18 DIAGNOSIS — Z66 Do not resuscitate: Secondary | ICD-10-CM | POA: Diagnosis not present

## 2023-11-18 DIAGNOSIS — E785 Hyperlipidemia, unspecified: Secondary | ICD-10-CM

## 2023-11-18 DIAGNOSIS — R262 Difficulty in walking, not elsewhere classified: Secondary | ICD-10-CM | POA: Diagnosis not present

## 2023-11-18 NOTE — Progress Notes (Signed)
 Location:  [facility] Room Number: 04-A Place of Service:  SNF [phone]) Provider:  Charlanne Fredia CROME, MD  Patient Care Team: Charlanne Fredia CROME, MD as PCP - General (Internal Medicine) Skeet Juliene SAUNDERS, DO as Consulting Physician (Neurology)  Extended Emergency Contact Information Primary Emergency Contact: [name],[name] Address: [address]          [place] [address] [place] Home Phone: [phone] Relation: Sister Secondary Emergency Contact: [facility] Address: [address] [place], [place] [address] [place] Phone: [phone] Relation: Brother  Code Status:  DNR Goals of care: Advanced Directive information    10/11/2023   10:32 AM  Advanced Directives  Does Patient Have a Medical Advance Directive? Yes  Type of Estate Agent of [place];Living will;Out of facility DNR (pink MOST or yellow form)  Does patient want to make changes to medical advance directive? No - Patient declined  Copy of Healthcare Power of Attorney in Chart? Yes - validated most recent copy scanned in chart (See row information)     Chief Complaint  Patient presents with   Medical Management of Chronic Issues    Routine visit     HPI:  Pt is a 83 y.o. female seen today for medical management of chronic diseases.    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 , also history of hypertension hyperlipidemia and diabetes mellitus  Patient recently had been refusing her Meds Also Now developed Right Leg Contracture which has led her to be Bed bound as they cannot get her up to her Chair Continues to be Aphasic Also has contractures in her Right hand Needs help with her feeding Has lost 10 lbs in past 3 months' Is on Nectar Thick Wt Readings from Last 3 Encounters:  11/18/23 123 lb 6.4 oz (56 kg)  11/16/23 123 lb 6.4 oz (56  kg)  10/11/23 129 lb 3.2 oz (58.6 kg)     Past Medical History:  Diagnosis Date   Diabetes mellitus    Dyslipidemia    Hypertension    Osteoporosis    Urinary incontinence    History reviewed. No pertinent surgical history.  Allergies  Allergen Reactions   Augmentin [Amoxicillin-Pot Clavulanate] Hives   Evista [Raloxifene]    Meloxicam     Unknown reaction   Scallops [Shellfish Allergy] Other (See Comments)    I don't feel so good after an hour   Toviaz [Fesoterodine Fumarate Er]     Unknown reaction    Outpatient Encounter Medications as of 11/18/2023  Medication Sig   acetaminophen  (TYLENOL ) 325 MG tablet Take 650 mg by mouth 2 (two) times daily.   atorvastatin  (LIPITOR) 40 MG tablet Take 1 tablet (40 mg total) by mouth daily.   Baclofen  5 MG TABS Take 0.5 tablets (2.5 mg total) by mouth 2 (two) times daily as needed for up to 7 days.   Calcium  Carb-Cholecalciferol (CALTRATE 600+D3 SOFT) 600-20 MG-MCG CHEW Chew 1 tablet by mouth in the morning and at bedtime.   cholecalciferol (VITAMIN D3) 25 MCG (1000 UNIT) tablet Take 1,000 Units by mouth daily.   clopidogrel  (PLAVIX ) 75 MG tablet Take 75 mg by mouth daily.   Glucerna (GLUCERNA) LIQD Take 237 mLs by mouth 3 (three) times daily. For weight loss   melatonin 3 MG TABS tablet Take 3  mg by mouth at bedtime.   metFORMIN  (GLUCOPHAGE ) 500 MG tablet Take 500 mg by mouth 2 (two) times daily with a meal.   sennosides-docusate sodium  (SENOKOT-S) 8.6-50 MG tablet Take 1 tablet by mouth in the morning and at bedtime. Hold for loose stools   zinc oxide 20 % ointment Apply 1 application topically as needed for irritation.   ARTIFICIAL TEAR OP Place 1 drop into both eyes in the morning, at noon, in the evening, and at bedtime. (Patient not taking: Reported on 11/18/2023)   No facility-administered encounter medications on file as of 11/18/2023.    Review of Systems  Unable to perform ROS: Dementia    Immunization History  Administered  Date(s) Administered   Fluad Quad(high Dose 65+) 09/02/2022   Influenza Inj Mdck Quad Pf 08/10/2019   Influenza, High Dose Seasonal PF 09/08/2023   Influenza-Unspecified 08/13/2020, 09/02/2021   Moderna Covid-19 Vaccine Bivalent Booster 36yrs & up 04/22/2022   Moderna SARS-COV2 Booster Vaccination 04/16/2021   Moderna Sars-Covid-2 Vaccination 12/11/2019, 01/08/2020, 04/22/2022   PNEUMOCOCCAL CONJUGATE-20 01/08/2022   Tdap 01/07/2022   Unspecified SARS-COV-2 Vaccination 09/23/2020, 07/30/2021, 09/15/2022   Zoster Recombinant(Shingrix) 07/01/2020, 09/15/2022   Pertinent  Health Maintenance Due  Topic Date Due   HEMOGLOBIN A1C  02/28/2024   FOOT EXAM  09/16/2024   OPHTHALMOLOGY EXAM  10/28/2024   INFLUENZA VACCINE  Completed   DEXA SCAN  Completed      04/12/2023   11:10 AM 04/16/2023    2:26 PM 06/14/2023    2:43 PM 07/07/2023   11:26 AM 09/17/2023    2:25 PM  Fall Risk  Falls in the past year? 1 0 0 0 0  Was there an injury with Fall? 0 0 0 0 0  Fall Risk Category Calculator 1 0 0 0 0  Patient at Risk for Falls Due to History of fall(s);Impaired balance/gait;Impaired mobility History of fall(s);Impaired balance/gait;Impaired mobility History of fall(s);Impaired balance/gait;Impaired mobility History of fall(s);Impaired balance/gait;Impaired mobility History of fall(s);Impaired balance/gait;Impaired mobility  Fall risk Follow up Falls evaluation completed;Education provided;Falls prevention discussed Falls evaluation completed;Education provided;Falls prevention discussed Falls evaluation completed;Education provided;Falls prevention discussed Falls evaluation completed;Education provided;Falls prevention discussed Falls evaluation completed;Education provided;Falls prevention discussed   Functional Status Survey:    Vitals:   11/18/23 1159  BP: 127/63  Pulse: 81  Resp: 20  Temp: 98.3 F (36.8 C)  SpO2: 94%  Weight: 123 lb 6.4 oz (56 kg)  Height: 5' 3 (1.6 m)   Body mass index  is 21.86 kg/m. Physical Exam Vitals reviewed.  HENT:     Head: Normocephalic.     Nose: Nose normal.     Mouth/Throat:     Mouth: Mucous membranes are moist.     Pharynx: Oropharynx is clear.  Eyes:     Pupils: Pupils are equal, round, and reactive to light.  Cardiovascular:     Rate and Rhythm: Normal rate and regular rhythm.     Pulses: Normal pulses.     Heart sounds: Normal heart sounds. No murmur heard. Pulmonary:     Effort: Pulmonary effort is normal.     Breath sounds: Normal breath sounds.  Abdominal:     General: Abdomen is flat. Bowel sounds are normal.     Palpations: Abdomen is soft.  Musculoskeletal:        General: No swelling.     Cervical back: Neck supple.  Skin:    General: Skin is warm.  Neurological:     Mental Status: She  is alert.     Comments: Has Right Leg in Contracture Able to Straighten it but she grimaces  Psychiatric:        Mood and Affect: Mood normal.        Thought Content: Thought content normal.     Labs reviewed: Recent Labs    01/22/23 0000 08/30/23 0000  NA 139 134*  K 4.1 4.2  CL 104 101  CO2 26* 29*  BUN 27* 18  CREATININE 0.9 0.8  CALCIUM  9.6 9.4   Recent Labs    08/30/23 0000  AST 14  ALT 16  ALKPHOS 63  ALBUMIN 3.8   Recent Labs    01/22/23 0000 08/30/23 0000  WBC 11.0 4.8  NEUTROABS 8,217.00  --   HGB 12.8 12.8  HCT 37 38  PLT 258 241   Lab Results  Component Value Date   TSH 1.14 10/16/2021   Lab Results  Component Value Date   HGBA1C 8.5 08/30/2023   Lab Results  Component Value Date   CHOL 149 08/30/2023   HDL 35 08/30/2023   LDLCALC 85 08/30/2023   TRIG 194 (A) 08/30/2023   CHOLHDL 4.5 08/20/2019    Significant Diagnostic Results in last 30 days:  No results found.  Assessment/Plan 1. DNR (do not resuscitate) (Primary) Discussed with Dick her Brother They want her to be comfortable Do not want anything aggressive They will come tomorrow to talk to Amy about making her Comfort  care  - Do not attempt resuscitation (DNR)  2. 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 Etiology 3. Type 2 diabetes mellitus with other neurologic complication, without long-term current use of insulin  (HCC) Change Metformin  to every day as she is refusing her Meds  4. History of TIA (transient ischemic attack) Discontinue Plavix  and Statin due to refusal  5. Weight loss   6. Oropharyngeal dysphagia Nector Thick  7. Neurocognitive disorder   8. Hyperlipidemia associated with type 2 diabetes mellitus (HCC) Discontinue statin due to Goals of care    Family/ staff Communication: Brother Rick  Labs/tests ordered:

## 2023-11-19 ENCOUNTER — Non-Acute Institutional Stay (SKILLED_NURSING_FACILITY): Payer: Self-pay | Admitting: Orthopedic Surgery

## 2023-11-19 ENCOUNTER — Encounter: Payer: Self-pay | Admitting: Orthopedic Surgery

## 2023-11-19 DIAGNOSIS — Z7189 Other specified counseling: Secondary | ICD-10-CM

## 2023-11-19 DIAGNOSIS — R627 Adult failure to thrive: Secondary | ICD-10-CM | POA: Diagnosis not present

## 2023-11-19 DIAGNOSIS — R419 Unspecified symptoms and signs involving cognitive functions and awareness: Secondary | ICD-10-CM

## 2023-11-19 MED ORDER — LORAZEPAM 0.5 MG PO TABS: 0.5000 mg | ORAL_TABLET | Freq: Two times a day (BID) | ORAL | 0 refills | Status: AC | PRN

## 2023-11-19 MED ORDER — MORPHINE SULFATE (CONCENTRATE) 20 MG/ML PO SOLN: 5.0000 mg | Freq: Four times a day (QID) | ORAL | 0 refills | Status: AC | PRN

## 2023-11-19 NOTE — Progress Notes (Signed)
 Location:   [facility]  Nursing Home Room Number: 4-A Place of Service:  SNF [phone]) Provider:  Greig Cluster, NP  PCP: Charlanne Fredia CROME, MD  Patient Care Team: Charlanne Fredia CROME, MD as PCP - General (Internal Medicine) Skeet Juliene SAUNDERS, DO as Consulting Physician (Neurology)  Extended Emergency Contact Information Primary Emergency Contact: [name],[name] Address: [address]          [place] [address] [place] Home Phone: [phone] Relation: Sister Secondary Emergency Contact: [facility] Address: [address] [place], [place] [address] [place] Mobile Phone: [phone] Relation: Brother  Code Status:  DNR Goals of care: Advanced Directive information    11/19/2023   10:56 AM  Advanced Directives  Does Patient Have a Medical Advance Directive? Yes  Type of Estate Agent of [place];Living will;Out of facility DNR (pink MOST or yellow form)  Does patient want to make changes to medical advance directive? No - Patient declined  Copy of Healthcare Power of Attorney in Chart? Yes - validated most recent copy scanned in chart (See row information)     Chief Complaint  Patient presents with   Medical Management of Chronic Issues    Routine Visit.     HPI:  Pt is a 83 y.o. female seen today for acute visit due to physical deconditioning.   She currently resides on the skilled nursing unit at [facility] 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    H/o Fragile X syndrome with neurocognitive disorder. 12/02 she began to develop worsening contractures to right upper extremity. She now has contracture involving right leg. She is bed bound at this time. Requires feeding assistance. Nonverbal. Family reports she will say a word or nod her head at times. She is followed  by ST due to dysphagia and dietary. No recent aspirations. Remains on Nectar thick liquids. She is on Glucerna and magic cups. She has lost 10 lbs within past 3 months. Earlier this week she stopped taking medications. 01/09 Dr. Charlanne reduced amount of p.o medications. This morning she would not take medications again. Her brother and sister are HPOA. Their father had h/o Fragile X and sister also reports having condition. Goals of care discussed today. They would like to make her comfort care. They do not wish for hospice to be consulted. They are requesting Dr.Gupta and I manage declining condition. She is a DNR.      Past Medical History:  Diagnosis Date   Diabetes mellitus    Dyslipidemia    Hypertension    Osteoporosis    Urinary incontinence    History reviewed. No pertinent surgical history.  Allergies  Allergen Reactions   Augmentin [Amoxicillin-Pot Clavulanate] Hives   Evista [Raloxifene]    Meloxicam     Unknown reaction   Scallops [Shellfish Allergy] Other (See Comments)    I don't feel so good after an hour   Toviaz [Fesoterodine Fumarate Er]     Unknown reaction    Allergies as of 11/19/2023       Reactions   Augmentin [amoxicillin-pot Clavulanate] Hives   Evista [raloxifene]    Meloxicam    Unknown reaction   Scallops [shellfish Allergy] Other (See Comments)   I don't feel so good after an hour   Toviaz [fesoterodine Fumarate Er]  Unknown reaction        Medication List        Accurate as of November 19, 2023 10:56 AM. If you have any questions, ask your nurse or doctor.          acetaminophen  325 MG tablet Commonly known as: TYLENOL  Take 650 mg by mouth 2 (two) times daily.   ARTIFICIAL TEAR OP Place 1 Units into both eyes in the morning, at noon, in the evening, and at bedtime.   Baclofen  5 MG Tabs Take 0.5 tablets (2.5 mg total) by mouth 2 (two) times daily as needed for up to 7 days.   Glucerna Liqd Take 237 mLs by mouth 3 (three)  times daily. For weight loss   melatonin 3 MG Tabs tablet Take 3 mg by mouth at bedtime.   metFORMIN  500 MG tablet Commonly known as: GLUCOPHAGE  Take 500 mg by mouth daily with breakfast.   OVER THE COUNTER MEDICATION Take 1 tablet by mouth 2 (two) times daily. Caltrate Chew   sennosides-docusate sodium  8.6-50 MG tablet Commonly known as: SENOKOT-S Take 1 tablet by mouth in the morning and at bedtime. Hold for loose stools   zinc oxide 20 % ointment Apply 1 application topically as needed for irritation.        Review of Systems  Unable to perform ROS: Dementia    Immunization History  Administered Date(s) Administered   Fluad Quad(high Dose 65+) 09/02/2022   Influenza Inj Mdck Quad Pf 08/10/2019   Influenza, High Dose Seasonal PF 09/08/2023   Influenza-Unspecified 08/13/2020, 09/02/2021   Moderna Covid-19 Vaccine Bivalent Booster 66yrs & up 04/22/2022   Moderna SARS-COV2 Booster Vaccination 04/16/2021   Moderna Sars-Covid-2 Vaccination 12/11/2019, 01/08/2020, 04/22/2022   PNEUMOCOCCAL CONJUGATE-20 01/08/2022   Tdap 01/07/2022   Unspecified SARS-COV-2 Vaccination 09/23/2020, 07/30/2021, 09/15/2022   Zoster Recombinant(Shingrix) 07/01/2020, 09/15/2022   Pertinent  Health Maintenance Due  Topic Date Due   HEMOGLOBIN A1C  02/28/2024   FOOT EXAM  09/16/2024   OPHTHALMOLOGY EXAM  10/28/2024   INFLUENZA VACCINE  Completed   DEXA SCAN  Completed      04/12/2023   11:10 AM 04/16/2023    2:26 PM 06/14/2023    2:43 PM 07/07/2023   11:26 AM 09/17/2023    2:25 PM  Fall Risk  Falls in the past year? 1 0 0 0 0  Was there an injury with Fall? 0 0 0 0 0  Fall Risk Category Calculator 1 0 0 0 0  Patient at Risk for Falls Due to History of fall(s);Impaired balance/gait;Impaired mobility History of fall(s);Impaired balance/gait;Impaired mobility History of fall(s);Impaired balance/gait;Impaired mobility History of fall(s);Impaired balance/gait;Impaired mobility History of  fall(s);Impaired balance/gait;Impaired mobility  Fall risk Follow up Falls evaluation completed;Education provided;Falls prevention discussed Falls evaluation completed;Education provided;Falls prevention discussed Falls evaluation completed;Education provided;Falls prevention discussed Falls evaluation completed;Education provided;Falls prevention discussed Falls evaluation completed;Education provided;Falls prevention discussed   Functional Status Survey:    Vitals:   11/19/23 1050  BP: 127/63  Pulse: 81  Resp: (!) 94  Temp: 98.3 F (36.8 C)  Weight: 123 lb 6.4 oz (56 kg)  Height: 5' 3 (1.6 m)  HC: 20 (50.8 cm)   Body mass index is 21.86 kg/m. Physical Exam Vitals reviewed.  Constitutional:      General: She is not in acute distress. HENT:     Head: Normocephalic.  Eyes:     General:        Right eye: No discharge.  Left eye: No discharge.  Cardiovascular:     Rate and Rhythm: Normal rate and regular rhythm.     Pulses: Normal pulses.     Heart sounds: Normal heart sounds.  Pulmonary:     Effort: Pulmonary effort is normal.     Breath sounds: Normal breath sounds.  Abdominal:     Palpations: Abdomen is soft.  Musculoskeletal:     Cervical back: Neck supple.     Right lower leg: No edema.     Left lower leg: No edema.  Skin:    General: Skin is warm.     Capillary Refill: Capillary refill takes less than 2 seconds.  Neurological:     General: No focal deficit present.     Mental Status: She is alert. Mental status is at baseline.     Motor: Weakness present.     Gait: Gait abnormal.     Comments: Bed bound, extremities contracted  Psychiatric:        Mood and Affect: Mood normal.     Comments: Nonverbal, does not follow all commands     Labs reviewed: Recent Labs    01/22/23 0000 08/30/23 0000  NA 139 134*  K 4.1 4.2  CL 104 101  CO2 26* 29*  BUN 27* 18  CREATININE 0.9 0.8  CALCIUM  9.6 9.4   Recent Labs    08/30/23 0000  AST 14  ALT  16  ALKPHOS 63  ALBUMIN 3.8   Recent Labs    01/22/23 0000 08/30/23 0000  WBC 11.0 4.8  NEUTROABS 8,217.00  --   HGB 12.8 12.8  HCT 37 38  PLT 258 241   Lab Results  Component Value Date   TSH 1.14 10/16/2021   Lab Results  Component Value Date   HGBA1C 8.5 08/30/2023   Lab Results  Component Value Date   CHOL 149 08/30/2023   HDL 35 08/30/2023   LDLCALC 85 08/30/2023   TRIG 194 (A) 08/30/2023   CHOLHDL 4.5 08/20/2019    Significant Diagnostic Results in last 30 days:  No results found.  Assessment/Plan 1. Advanced directives, counseling/discussion (Primary) - comfort care per family> discussed comfort care and benefits of hospice - does not hospice but provider to manage - she is DNR - no future hospitalizations  2. Neurocognitive disorder - late stages - bed bound - worsening contractures - total assist with all ADLs - nonverbal  3. Adult failure to thrive - began refusing medications this week, did not take them today - she is eating and drinking less per nursing  - consider stopping po medications if intake declines more  - recommend reducing metformin  to 250 mg daily if she continues to eat/drink - discontinue PT/OT/ST/dietary services - morphine  (ROXANOL) 20 MG/ML concentrated solution; Take 0.25 mLs (5 mg total) by mouth every 6 (six) hours as needed for shortness of breath, moderate pain (pain score 4-6) or anxiety.  Dispense: 15 mL; Refill: 0 - LORazepam  (ATIVAN ) 0.5 MG tablet; Take 1 tablet (0.5 mg total) by mouth 2 (two) times daily as needed for anxiety.  Dispense: 30 tablet; Refill: 0    Family/ staff Communication: plan discussed with patient and nurse  Labs/tests ordered: none

## 2023-11-26 ENCOUNTER — Encounter: Payer: Self-pay | Admitting: Orthopedic Surgery

## 2023-11-26 ENCOUNTER — Non-Acute Institutional Stay (SKILLED_NURSING_FACILITY): Payer: Self-pay | Admitting: Orthopedic Surgery

## 2023-11-26 DIAGNOSIS — E1149 Type 2 diabetes mellitus with other diabetic neurological complication: Secondary | ICD-10-CM | POA: Diagnosis not present

## 2023-11-26 DIAGNOSIS — R419 Unspecified symptoms and signs involving cognitive functions and awareness: Secondary | ICD-10-CM | POA: Diagnosis not present

## 2023-11-26 DIAGNOSIS — S51011A Laceration without foreign body of right elbow, initial encounter: Secondary | ICD-10-CM

## 2023-11-26 DIAGNOSIS — E441 Mild protein-calorie malnutrition: Secondary | ICD-10-CM

## 2023-11-26 DIAGNOSIS — L89322 Pressure ulcer of left buttock, stage 2: Secondary | ICD-10-CM

## 2023-11-26 NOTE — Progress Notes (Signed)
Location:  [facility] Room Number: 4/A Place of Service:  SNF [phone]) Provider:  Octavia Heir, NP   Mahlon Gammon, MD  Patient Care Team: Mahlon Gammon, MD as PCP - General (Internal Medicine) [name], DO as Consulting Physician (Neurology)  Extended Emergency Contact Information Primary Emergency Contact: [name],[name] Address: [address]          [place] [address] [place] Home Phone: [phone] Relation: Sister Secondary Emergency Contact: [facility] Address: [address] [place], [place] [address] [name] Mobile Phone: [phone] Relation: Brother  Code Status:  DNR Goals of care: Advanced Directive information    11/19/2023   10:56 AM  Advanced Directives  Does Patient Have a Medical Advance Directive? Yes  Type of Estate agent of [place];Living will;Out of facility DNR (pink MOST or yellow form)  Does patient want to make changes to medical advance directive? No - Patient declined  Copy of Healthcare Power of Attorney in Chart? Yes - validated most recent copy scanned in chart (See row information)     Chief Complaint  Patient presents with   Acute Visit    Skin tear    HPI:  Pt is a 83 y.o. female seen today for acute visit due to skin tear.   She currently resides on the skilled nursing unit at [facility]. 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    Right elbow skin tear- DOI 01/16, injury occurred while receiving care, covered with steri strips and paper tape dressing Stage II PU left buttocks- DOI 01/16, 1 x 3.5 cm, covered with foam dressing  Neurocognitive disorder- 01/10 comfort measures, total care with ADLs including feeding, non verbal, not always taking po medication> refused 01/11 and 01/12> po  medications now given in evening for compliance, no behaviors, right leg and hand contracted, 01/09 started on morphine concentrate and ativan> not used per chart review T2DM- A1c 8.5 (10/22)> was 8.2 (06/07)> was 6.4 (12/05), recent sugar 398> see above, metformin now scheduled in PM Poor appetite/malnutrition- BMI 21.86, 10 lbs weight loss within past 3 months, needs feeding assistance, skipping meals more often      Past Medical History:  Diagnosis Date   Diabetes mellitus    Dyslipidemia    Hypertension    Osteoporosis    Urinary incontinence    No past surgical history on file.  Allergies  Allergen Reactions   Augmentin [Amoxicillin-Pot Clavulanate] Hives   Evista [Raloxifene]    Meloxicam     Unknown reaction   Scallops [Shellfish Allergy] Other (See Comments)    "I don't feel so good after an hour"   Toviaz [Fesoterodine Fumarate Er]     Unknown reaction    Outpatient Encounter Medications as of 11/26/2023  Medication Sig   acetaminophen (TYLENOL) 325 MG tablet Take 650 mg by mouth 2 (two) times daily.   ARTIFICIAL TEAR OP Place 1 Units into both eyes in the morning, at noon, in the evening, and at bedtime.   Glucerna (GLUCERNA) LIQD Take 237 mLs by mouth 3 (three) times daily. For weight loss   LORazepam (ATIVAN) 0.5 MG tablet Take 1 tablet (0.5 mg total) by mouth 2 (two) times daily as needed for anxiety.   melatonin 3 MG TABS tablet Take 3  mg by mouth at bedtime.   metFORMIN (GLUCOPHAGE) 500 MG tablet Take 500 mg by mouth daily with breakfast.   morphine (ROXANOL) 20 MG/ML concentrated solution Take 0.25 mLs (5 mg total) by mouth every 6 (six) hours as needed for shortness of breath, moderate pain (pain score 4-6) or anxiety.   OVER THE COUNTER MEDICATION Take 1 tablet by mouth 2 (two) times daily. Caltrate Chew   sennosides-docusate sodium (SENOKOT-S) 8.6-50 MG tablet Take 1 tablet by mouth in the morning and at bedtime. Hold for loose stools   zinc oxide 20 %  ointment Apply 1 application topically as needed for irritation.   No facility-administered encounter medications on file as of 11/26/2023.    Review of Systems  Unable to perform ROS: Patient nonverbal    Immunization History  Administered Date(s) Administered   Fluad Quad(high Dose 65+) 09/02/2022   Influenza Inj Mdck Quad Pf 08/10/2019   Influenza, High Dose Seasonal PF 09/08/2023   Influenza-Unspecified 08/13/2020, 09/02/2021   Moderna Covid-19 Vaccine Bivalent Booster 74yrs & up 04/22/2022   Moderna SARS-COV2 Booster Vaccination 04/16/2021   Moderna Sars-Covid-2 Vaccination 12/11/2019, 01/08/2020, 04/22/2022   PNEUMOCOCCAL CONJUGATE-20 01/08/2022   Tdap 01/07/2022   Unspecified SARS-COV-2 Vaccination 09/23/2020, 07/30/2021, 09/15/2022   Zoster Recombinant(Shingrix) 07/01/2020, 09/15/2022   Pertinent  Health Maintenance Due  Topic Date Due   HEMOGLOBIN A1C  02/28/2024   FOOT EXAM  09/16/2024   OPHTHALMOLOGY EXAM  10/28/2024   INFLUENZA VACCINE  Completed   DEXA SCAN  Completed      04/12/2023   11:10 AM 04/16/2023    2:26 PM 06/14/2023    2:43 PM 07/07/2023   11:26 AM 09/17/2023    2:25 PM  Fall Risk  Falls in the past year? 1 0 0 0 0  Was there an injury with Fall? 0 0 0 0 0  Fall Risk Category Calculator 1 0 0 0 0  Patient at Risk for Falls Due to History of fall(s);Impaired balance/gait;Impaired mobility History of fall(s);Impaired balance/gait;Impaired mobility History of fall(s);Impaired balance/gait;Impaired mobility History of fall(s);Impaired balance/gait;Impaired mobility History of fall(s);Impaired balance/gait;Impaired mobility  Fall risk Follow up Falls evaluation completed;Education provided;Falls prevention discussed Falls evaluation completed;Education provided;Falls prevention discussed Falls evaluation completed;Education provided;Falls prevention discussed Falls evaluation completed;Education provided;Falls prevention discussed Falls evaluation  completed;Education provided;Falls prevention discussed   Functional Status Survey:    Vitals:   11/26/23 1032  BP: 131/69  Pulse: 78  Resp: 18  Temp: 97.6 F (36.4 C)  SpO2: 95%  Weight: 123 lb 6.4 oz (56 kg)  Height: 5\' 3"  (1.6 m)   Body mass index is 21.86 kg/m. Physical Exam Vitals reviewed.  Constitutional:      General: She is not in acute distress. HENT:     Head: Normocephalic.  Eyes:     General:        Right eye: No discharge.        Left eye: No discharge.  Cardiovascular:     Rate and Rhythm: Normal rate and regular rhythm.     Pulses: Normal pulses.     Heart sounds: Normal heart sounds.  Pulmonary:     Effort: Pulmonary effort is normal.     Breath sounds: Normal breath sounds.  Abdominal:     General: Bowel sounds are normal.     Palpations: Abdomen is soft.  Musculoskeletal:     Cervical back: Neck supple.     Right lower leg: No edema.     Left lower leg:  No edema.  Skin:    General: Skin is warm.     Capillary Refill: Capillary refill takes less than 2 seconds.     Comments: Approx 1-2 cm skin tear to right elbow, CDI, no sign of infection.   Neurological:     General: No focal deficit present.     Mental Status: She is alert. Mental status is at baseline.     Motor: Weakness present.     Gait: Gait abnormal.  Psychiatric:        Mood and Affect: Mood normal.     Labs reviewed: Recent Labs    01/22/23 0000 08/30/23 0000  NA 139 134*  K 4.1 4.2  CL 104 101  CO2 26* 29*  BUN 27* 18  CREATININE 0.9 0.8  CALCIUM 9.6 9.4   Recent Labs    08/30/23 0000  AST 14  ALT 16  ALKPHOS 63  ALBUMIN 3.8   Recent Labs    01/22/23 0000 08/30/23 0000  WBC 11.0 4.8  NEUTROABS 8,217.00  --   HGB 12.8 12.8  HCT 37 38  PLT 258 241   Lab Results  Component Value Date   TSH 1.14 10/16/2021   Lab Results  Component Value Date   HGBA1C 8.5 08/30/2023   Lab Results  Component Value Date   CHOL 149 08/30/2023   HDL 35 08/30/2023    LDLCALC 85 08/30/2023   TRIG 194 (A) 08/30/2023   CHOLHDL 4.5 08/20/2019    Significant Diagnostic Results in last 30 days:  No results found.  Assessment/Plan: 1. Skin tear of right elbow without complication, initial encounter (Primary) - DOI: 01/16> occurred with care in bed - no sign of infection - cont steri strips and paper tape dressing prn  2. Pressure injury of left buttock, stage 2 (HCC) - 01/16 per wound nurse - patient bed bound and incontinent - cont frequent position changes  3. Neurocognitive disorder - dependent with ADLs including feeding - right side contracted - no behaviors - nonverbal - 01/09 comfort care started> oral morphine/ ativan started prn  4. Type 2 diabetes mellitus with other neurologic complication, without long-term current use of insulin (HCC) - recent A1c 8.5, recent sugar 398 - did not take metformin 01/11 & 01/12 per chart review - metformin scheduled in evening for improved compliance - recommend stopping metformin when po intake declines  5. Mild protein-calorie malnutrition (HCC) - BMI 21.86 - 10 lbs within last 3 months  - cont glucerna TID     Family/ staff Communication: plan discussed with patient and nurse  Labs/tests ordered:  none

## 2023-12-03 ENCOUNTER — Encounter: Payer: Self-pay | Admitting: Orthopedic Surgery

## 2023-12-03 ENCOUNTER — Non-Acute Institutional Stay (SKILLED_NURSING_FACILITY): Payer: Medicare Other | Admitting: Orthopedic Surgery

## 2023-12-03 DIAGNOSIS — L89322 Pressure ulcer of left buttock, stage 2: Secondary | ICD-10-CM

## 2023-12-03 DIAGNOSIS — R112 Nausea with vomiting, unspecified: Secondary | ICD-10-CM

## 2023-12-03 DIAGNOSIS — L89312 Pressure ulcer of right buttock, stage 2: Secondary | ICD-10-CM | POA: Diagnosis not present

## 2023-12-03 DIAGNOSIS — S90424A Blister (nonthermal), right lesser toe(s), initial encounter: Secondary | ICD-10-CM | POA: Diagnosis not present

## 2023-12-03 MED ORDER — MAALOX MULTI SYMPTOM MAX ST 400-400-40 MG/5ML PO SUSP: 30.0000 mL | Freq: Four times a day (QID) | ORAL | Status: AC | PRN

## 2023-12-03 NOTE — Progress Notes (Signed)
Location:  [facility] Room Number: 4/A Place of Service:  SNF [phone]) Provider:  Octavia Heir, NP   Mahlon Gammon, MD  Patient Care Team: Mahlon Gammon, MD as PCP - General (Internal Medicine) [name], DO as Consulting Physician (Neurology)  Extended Emergency Contact Information Primary Emergency Contact: [name],[name] Address: [address]          [place] [address] [place] Home Phone: [phone] Relation: Sister Secondary Emergency Contact: [facility] Address: [address] [place], [place] [address] [name] Mobile Phone: [phone] Relation: Brother  Code Status:  DNR Goals of care: Advanced Directive information    11/19/2023   10:56 AM  Advanced Directives  Does Patient Have a Medical Advance Directive? Yes  Type of Estate agent of [place];Living will;Out of facility DNR (pink MOST or yellow form)  Does patient want to make changes to medical advance directive? No - Patient declined  Copy of Healthcare Power of Attorney in Chart? Yes - validated most recent copy scanned in chart (See row information)     Chief Complaint  Patient presents with   Acute Visit    Skin breakdown, nausea    HPI:  Pt is a 83 y.o. female seen today for acute visit due to recent nausea and skin breakdown.   She currently resides on the skilled nursing unit at [facility]. 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     Poor historian due to neurocognitive disorder. 01/09 family decided on comfort care approach. 01/23 she had one episode of nausea and vomiting. Po intake continues to decline. Some of her po medications were discontinued by Dr. Chales Abrahams a week ago. She is sleeping more in the mornings and skipping breakfast. Nursing moved po  medications to be given with dinner. She has done well with time change. Abdomen soft with normal bowel sounds. LBM was 01/22. She is drinking Boost when offered. Afebrile. Vitals stable.   Followed by wound nurse due to skin breakdown. Right leg became contacted within past 6 weeks. She is unable to [employer] transfer into wheelchair. She now had 2 sacral PU to buttocks. Right great toe also has blister to where foot bends into left leg. Nursing is applying zinc oxide to pressure points.   Past Medical History:  Diagnosis Date   Diabetes mellitus    Dyslipidemia    Hypertension    Osteoporosis    Urinary incontinence    No past surgical history on file.  Allergies  Allergen Reactions   Augmentin [Amoxicillin-Pot Clavulanate] Hives   Evista [Raloxifene]    Meloxicam     Unknown reaction   Scallops [Shellfish Allergy] Other (See Comments)    "I don't feel so good after an hour"   Toviaz [Fesoterodine Fumarate Er]     Unknown reaction    Outpatient Encounter Medications as of 12/03/2023  Medication Sig   acetaminophen (TYLENOL) 325 MG tablet Take 650 mg by mouth 2 (two) times daily.   ARTIFICIAL TEAR OP Place 1 Units into both eyes in the morning, at noon, in the evening, and at bedtime.   Glucerna (GLUCERNA) LIQD Take 237 mLs by mouth 3 (three) times daily. For weight loss   LORazepam (ATIVAN) 0.5 MG tablet Take 1 tablet (0.5 mg total) by  mouth 2 (two) times daily as needed for anxiety.   melatonin 3 MG TABS tablet Take 3 mg by mouth at bedtime.   metFORMIN (GLUCOPHAGE) 500 MG tablet Take 500 mg by mouth daily with breakfast.   morphine (ROXANOL) 20 MG/ML concentrated solution Take 0.25 mLs (5 mg total) by mouth every 6 (six) hours as needed for shortness of breath, moderate pain (pain score 4-6) or anxiety.   OVER THE COUNTER MEDICATION Take 1 tablet by mouth 2 (two) times daily. Caltrate Chew   sennosides-docusate sodium (SENOKOT-S) 8.6-50 MG tablet Take 1 tablet by mouth in the  morning and at bedtime. Hold for loose stools   zinc oxide 20 % ointment Apply 1 application topically as needed for irritation.   No facility-administered encounter medications on file as of 12/03/2023.    Review of Systems  Unable to perform ROS: Patient nonverbal    Immunization History  Administered Date(s) Administered   Fluad Quad(high Dose 65+) 09/02/2022   Influenza Inj Mdck Quad Pf 08/10/2019   Influenza, High Dose Seasonal PF 09/08/2023   Influenza-Unspecified 08/13/2020, 09/02/2021   Moderna Covid-19 Vaccine Bivalent Booster 43yrs & up 04/22/2022   Moderna SARS-COV2 Booster Vaccination 04/16/2021   Moderna Sars-Covid-2 Vaccination 12/11/2019, 01/08/2020, 04/22/2022   PNEUMOCOCCAL CONJUGATE-20 01/08/2022   Tdap 01/07/2022   Unspecified SARS-COV-2 Vaccination 09/23/2020, 07/30/2021, 09/15/2022   Zoster Recombinant(Shingrix) 07/01/2020, 09/15/2022   Pertinent  Health Maintenance Due  Topic Date Due   HEMOGLOBIN A1C  02/28/2024   FOOT EXAM  09/16/2024   OPHTHALMOLOGY EXAM  10/28/2024   INFLUENZA VACCINE  Completed   DEXA SCAN  Completed      04/16/2023    2:26 PM 06/14/2023    2:43 PM 07/07/2023   11:26 AM 09/17/2023    2:25 PM 11/26/2023    1:21 PM  Fall Risk  Falls in the past year? 0 0 0 0 1  Was there an injury with Fall? 0 0 0 0 0  Fall Risk Category Calculator 0 0 0 0 1  Patient at Risk for Falls Due to History of fall(s);Impaired balance/gait;Impaired mobility History of fall(s);Impaired balance/gait;Impaired mobility History of fall(s);Impaired balance/gait;Impaired mobility History of fall(s);Impaired balance/gait;Impaired mobility History of fall(s);Impaired balance/gait;Impaired mobility  Fall risk Follow up Falls evaluation completed;Education provided;Falls prevention discussed Falls evaluation completed;Education provided;Falls prevention discussed Falls evaluation completed;Education provided;Falls prevention discussed Falls evaluation completed;Education  provided;Falls prevention discussed Falls evaluation completed;Education provided   Functional Status Survey:    Vitals:   12/03/23 1252  BP: 122/70  Pulse: 99  Resp: 20  Temp: (!) 96.7 F (35.9 C)  SpO2: 97%  Weight: 118 lb 8 oz (53.8 kg)  Height: 5\' 3"  (1.6 m)   Body mass index is 20.99 kg/m. Physical Exam Vitals reviewed.  Constitutional:      General: She is not in acute distress. HENT:     Head: Normocephalic.  Eyes:     General:        Right eye: No discharge.        Left eye: No discharge.     Pupils: Pupils are equal, round, and reactive to light.  Cardiovascular:     Rate and Rhythm: Normal rate and regular rhythm.     Pulses: Normal pulses.     Heart sounds: Normal heart sounds.  Pulmonary:     Effort: Pulmonary effort is normal.     Breath sounds: Normal breath sounds.  Abdominal:     General: Bowel sounds are normal. There is no  distension.     Palpations: Abdomen is soft.     Tenderness: There is no abdominal tenderness. There is no rebound.  Musculoskeletal:     Cervical back: Neck supple.     Right lower leg: No edema.     Left lower leg: No edema.  Skin:    General: Skin is warm.     Capillary Refill: Capillary refill takes less than 2 seconds.     Comments: Left stage II PU 1 x 0.2 & 1.6 x 1.2, no sign of infection. Right stage II PU 1.3 x 0.8, no sign of infection. Pea sized blister to right great toe where toe rubs left leg, no sign of infection.   Neurological:     General: No focal deficit present.     Mental Status: She is alert.     Motor: Weakness present.     Gait: Gait abnormal.  Psychiatric:        Mood and Affect: Mood normal.     Labs reviewed: Recent Labs    01/22/23 0000 08/30/23 0000  NA 139 134*  K 4.1 4.2  CL 104 101  CO2 26* 29*  BUN 27* 18  CREATININE 0.9 0.8  CALCIUM 9.6 9.4   Recent Labs    08/30/23 0000  AST 14  ALT 16  ALKPHOS 63  ALBUMIN 3.8   Recent Labs    01/22/23 0000 08/30/23 0000  WBC  11.0 4.8  NEUTROABS 8,217.00  --   HGB 12.8 12.8  HCT 37 38  PLT 258 241   Lab Results  Component Value Date   TSH 1.14 10/16/2021   Lab Results  Component Value Date   HGBA1C 8.5 08/30/2023   Lab Results  Component Value Date   CHOL 149 08/30/2023   HDL 35 08/30/2023   LDLCALC 85 08/30/2023   TRIG 194 (A) 08/30/2023   CHOLHDL 4.5 08/20/2019    Significant Diagnostic Results in last 30 days:  No results found.  Assessment/Plan 1. Nausea and vomiting, unspecified vomiting type (Primary) - noted 01/23 x 1 episode - afebrile, abdomen soft, active BS x4, LBM 01/22> normal - suspect indigestion due to decreased po intake - compliance better with fluids than pills (even crushed)  - start maalox- 30 ml po Q6 hrs prn   2. Pressure injury of right buttock, stage 2 (HCC) - new occurrence DOI: 01/24 - no sign of infection - bed bound and incontinent - cont zinc oxide and frequent turning  3. Pressure injury of left buttock, stage 2 (HCC) - ongoing  - DOI: 01/16 - no sign of infection - bed bound and incontinent - cont zinc oxide and frequent turning  4. Toe blister without infection, right, initial encounter - DOI: 01/24 - no sign of infection - associated with right leg contracture> right foot pressing into left knee/leg - start podon boots     Family/ staff Communication: plan discussed with patient and nurse  Labs/tests ordered:  none

## 2023-12-11 DEATH — deceased
